# Patient Record
Sex: Female | Born: 1980
Health system: Southern US, Community
[De-identification: ages and names within clinical notes are randomized; demographics above are authoritative.]

## PROBLEM LIST (undated history)

## (undated) DIAGNOSIS — I10 Essential (primary) hypertension: Secondary | ICD-10-CM

## (undated) DIAGNOSIS — E119 Type 2 diabetes mellitus without complications: Secondary | ICD-10-CM

## (undated) DIAGNOSIS — E282 Polycystic ovarian syndrome: Secondary | ICD-10-CM

## (undated) DIAGNOSIS — E785 Hyperlipidemia, unspecified: Secondary | ICD-10-CM

## (undated) HISTORY — DX: Essential (primary) hypertension: I10

## (undated) HISTORY — DX: Polycystic ovarian syndrome: E28.2

## (undated) HISTORY — DX: Type 2 diabetes mellitus without complications: E11.9

## (undated) HISTORY — DX: Hyperlipidemia, unspecified: E78.5

---

## 2001-10-07 ENCOUNTER — Emergency Department (HOSPITAL_COMMUNITY): Admission: EM | Admit: 2001-10-07 | Discharge: 2001-10-07 | Payer: Self-pay | Admitting: Emergency Medicine

## 2004-03-08 ENCOUNTER — Emergency Department (HOSPITAL_COMMUNITY): Admission: EM | Admit: 2004-03-08 | Discharge: 2004-03-08 | Payer: Self-pay | Admitting: Emergency Medicine

## 2007-04-20 ENCOUNTER — Emergency Department (HOSPITAL_COMMUNITY): Admission: EM | Admit: 2007-04-20 | Discharge: 2007-04-21 | Payer: Self-pay | Admitting: Emergency Medicine

## 2008-07-20 ENCOUNTER — Inpatient Hospital Stay (HOSPITAL_COMMUNITY): Admission: AD | Admit: 2008-07-20 | Discharge: 2008-07-20 | Payer: Self-pay | Admitting: Obstetrics and Gynecology

## 2008-09-30 ENCOUNTER — Inpatient Hospital Stay (HOSPITAL_COMMUNITY): Admission: AD | Admit: 2008-09-30 | Discharge: 2008-10-05 | Payer: Self-pay | Admitting: Obstetrics and Gynecology

## 2008-09-30 ENCOUNTER — Inpatient Hospital Stay (HOSPITAL_COMMUNITY): Admission: AD | Admit: 2008-09-30 | Discharge: 2008-09-30 | Payer: Self-pay | Admitting: Obstetrics and Gynecology

## 2008-10-01 ENCOUNTER — Encounter: Payer: Self-pay | Admitting: Obstetrics and Gynecology

## 2008-10-02 ENCOUNTER — Encounter (INDEPENDENT_AMBULATORY_CARE_PROVIDER_SITE_OTHER): Payer: Self-pay | Admitting: *Deleted

## 2008-10-02 ENCOUNTER — Encounter (INDEPENDENT_AMBULATORY_CARE_PROVIDER_SITE_OTHER): Payer: Self-pay | Admitting: Obstetrics and Gynecology

## 2010-07-26 LAB — TYPE AND SCREEN
ABO/RH(D): O POS
ABO/RH(D): O POS
Antibody Screen: NEGATIVE
Antibody Screen: NEGATIVE

## 2010-07-26 LAB — PROTIME-INR
INR: 1 (ref 0.00–1.49)
Prothrombin Time: 13.2 seconds (ref 11.6–15.2)

## 2010-07-26 LAB — CBC
HCT: 26 % — ABNORMAL LOW (ref 36.0–46.0)
HCT: 30.4 % — ABNORMAL LOW (ref 36.0–46.0)
HCT: 32.3 % — ABNORMAL LOW (ref 36.0–46.0)
HCT: 33 % — ABNORMAL LOW (ref 36.0–46.0)
Hemoglobin: 10.7 g/dL — ABNORMAL LOW (ref 12.0–15.0)
Hemoglobin: 8.7 g/dL — ABNORMAL LOW (ref 12.0–15.0)
Hemoglobin: 9.9 g/dL — ABNORMAL LOW (ref 12.0–15.0)
Hemoglobin: 11 g/dL — ABNORMAL LOW (ref 12.0–15.0)
MCHC: 32.7 g/dL (ref 30.0–36.0)
MCHC: 33.2 g/dL (ref 30.0–36.0)
MCHC: 33.3 g/dL (ref 30.0–36.0)
MCHC: 33.4 g/dL (ref 30.0–36.0)
MCV: 74.1 fL — ABNORMAL LOW (ref 78.0–100.0)
MCV: 74.3 fL — ABNORMAL LOW (ref 78.0–100.0)
MCV: 74.4 fL — ABNORMAL LOW (ref 78.0–100.0)
MCV: 74.2 fL — ABNORMAL LOW (ref 78.0–100.0)
Platelets: 247 10*3/uL (ref 150–400)
Platelets: 310 10*3/uL (ref 150–400)
Platelets: 312 10*3/uL (ref 150–400)
Platelets: 316 10*3/uL (ref 150–400)
RBC: 3.49 MIL/uL — ABNORMAL LOW (ref 3.87–5.11)
RBC: 4.09 MIL/uL (ref 3.87–5.11)
RBC: 4.36 MIL/uL (ref 3.87–5.11)
RBC: 4.44 MIL/uL (ref 3.87–5.11)
RDW: 15.1 % (ref 11.5–15.5)
RDW: 15.1 % (ref 11.5–15.5)
RDW: 15.2 % (ref 11.5–15.5)
RDW: 14.8 % (ref 11.5–15.5)
WBC: 14.1 10*3/uL — ABNORMAL HIGH (ref 4.0–10.5)
WBC: 15.5 10*3/uL — ABNORMAL HIGH (ref 4.0–10.5)
WBC: 17 10*3/uL — ABNORMAL HIGH (ref 4.0–10.5)
WBC: 13.8 10*3/uL — ABNORMAL HIGH (ref 4.0–10.5)

## 2010-07-26 LAB — FIBRINOGEN: Fibrinogen: 545 mg/dL — ABNORMAL HIGH (ref 204–475)

## 2010-07-26 LAB — TSH: TSH: 1.025 u[IU]/mL (ref 0.350–4.500)

## 2010-07-26 LAB — RPR: RPR Ser Ql: NONREACTIVE

## 2010-07-26 LAB — ABO/RH: ABO/RH(D): O POS

## 2010-07-26 LAB — APTT: aPTT: 23 seconds — ABNORMAL LOW (ref 24–37)

## 2010-07-28 LAB — URINALYSIS, ROUTINE W REFLEX MICROSCOPIC
Bilirubin Urine: NEGATIVE
Glucose, UA: NEGATIVE mg/dL
Hgb urine dipstick: NEGATIVE
Ketones, ur: 15 mg/dL — AB
Nitrite: NEGATIVE
Protein, ur: NEGATIVE mg/dL
Specific Gravity, Urine: >1.03 — ABNORMAL HIGH (ref 1.005–1.030)
Urobilinogen, UA: 0.2 mg/dL (ref 0.0–1.0)
pH: 6.5 (ref 5.0–8.0)

## 2010-07-28 LAB — WET PREP, GENITAL
Trich, Wet Prep: NONE SEEN
Yeast Wet Prep HPF POC: NONE SEEN

## 2010-08-31 NOTE — Op Note (Signed)
NAME:  Ana [name], SPRUNGER NO.:  [id number]   MEDICAL RECORD NO.:  [id number]          PATIENT TYPE:  OUT   LOCATION:  MFM                           FACILITY:  [facility]   PHYSICIAN:  Lenoard Aden, M.D.DATE OF BIRTH:  1980/11/25   DATE OF PROCEDURE:  10/02/2008  DATE OF DISCHARGE:  10/01/2008                               OPERATIVE REPORT   PREOPERATIVE DIAGNOSIS:  A 30-6/7-week intrauterine pregnancy with third  trimester bleeding and acute placental abruption.   POSTOPERATIVE DIAGNOSIS:  A 30-6/7-week intrauterine pregnancy with  third trimester bleeding and acute placental abruption.   PROCEDURE:  Primary low segment transverse cesarean section.   SURGEON:  Lenoard Aden, MD   ASSISTANT:  Genia Del, MD   ANESTHESIA:  Spinal by Malen Gauze.   ESTIMATED BLOOD LOSS:  900 mL.   COMPLICATIONS:  None.   DRAINS:  Foley.   COUNTS:  Correct.  The patient to recovery in good condition.   BRIEF OPERATIVE NOTE:  After being apprised of risks of anesthesia,  infection, bleeding, injury to abdominal organs, need for repair,  delayed versus immediate complications include bowel and bladder injury,  the patient was brought to the operating room where she was administered  a general anesthetic without complications, prepped and draped in usual  sterile fashion.  Foley catheter placed after achieving adequate  anesthesia dilute Marcaine solution placed.  A Pfannenstiel skin  incision made with scalpel, carried down to fascia which was nicked in  the midline, and opened transversely using Mayo scissors.  Rectus  muscles dissected sharply in the midline.  Peritoneum entered sharply.  Bladder blade placed.  Visceral peritoneum scored sharply off the lower  uterine segment.  Kerr hysterotomy incision made.  Atraumatic delivery  after amniotomy clear fluid preterm living female handed to the  pediatricians attendance.  Apgars pending.  Cord blood collected.  Cord  pH  blood collected.  Placenta delivered from posterior and anterior  location with succenturiate lobe noted and sent to pathology.  Uterus  was exteriorized, curetted using a dry lap pack, and closed in 2 running  imbricating layers of 0 Monocryl suture.  Good hemostasis noted.  Bladder flap inspected and found to be hemostatic.  Irrigation  accomplished.  Fascia then closed using 0  Monocryl in a running fashion.  Skin closed using staples.  The patient  tolerates the procedure well, transferred to recovery in good condition.  Normal tubes and normal ovaries were identified.  Normal intrauterine  cavity appreciated.      Lenoard Aden, M.D.  Electronically Signed     RJT/MEDQ  D:  10/02/2008  T:  10/03/2008  Job:  [id number]

## 2010-08-31 NOTE — H&P (Signed)
NAMEKRISSI, Ana [name] NO.:  [id number]   MEDICAL RECORD NO.:  [id number]          PATIENT TYPE:  INP   LOCATION:  9151                          FACILITY:  [facility]   PHYSICIAN:  Lenoard Aden, M.D.DATE OF BIRTH:  27-Jun-1980   DATE OF ADMISSION:  09/30/2008  DATE OF DISCHARGE:                              HISTORY & PHYSICAL   CHIEF COMPLAINT:  Third trimester bleeding.   She is a 30 year old African American female, G2, P0, history of SAB at  12 weeks, who presents with onset of bleeding this morning after  intercourse on Sunday and Monday was evaluated in the office with a  normal ultrasound negative.  Pelvic exam and the BPP elevated, and  received betamethasone x1 this afternoon, and then had recurrent  bleeding bright red blood this evening is now admitted for further  observation.   She has no known drug allergies.   Medications are prenatal vitamins.   She has a medical history remarkable for depression and fertility and  PCL.   She has family history of sickle cell trait, diabetes, myocardial  infarction, chronic hypertension, and thyroid disease.  She has a  history of 1 uncomplicated 12-week pregnancy loss in 2004.   Prenatal course has been complicated by a more recent ultrasound which  had shown evidence of polyhydramnios, but appropriately grown fetus.  Polyhydramnios, however, had resolved on ultrasound today with an AFI of  21.   PHYSICAL EXAMINATION:  GENERAL:  She is a well-developed, well-  nourished, African American female, in no acute distress.  VITAL SIGNS:  Blood pressure 148/90, 110/70 in the office today.  HEENT: Normal.  NECK:  Supple.  Full range of motion.  LUNGS:  Clear.  HEART:  Regular rate and rhythm.  ABDOMEN:  Soft, gravid, nontender.  No CVA tenderness.  CERVICAL:  Deferred.  EXTREMITIES:  There are no cords.  NEUROLOGIC:  Nonfocal.  SKIN:  Intact.   The patient has brought a piece of toilet paper to the hospital  with  minimal amount of bright red spotting on it.  NST reactive.  No regular  contractions are noted.   IMPRESSION:  A 30-4/7 week intrauterine pregnancy with unexplained third  trimester bleeding, questionable chronic abruptio, history of an  ultrasound with appropriately grown fetus, and normal amniotic fluid  index.   PLAN:  1. Continuous monitoring, tocolysis as needed, betamethasone at times.  2. To be given as scheduled and will remain as an inpatient at this      time.      Lenoard Aden, M.D.  Electronically Signed     RJT/MEDQ  D:  09/30/2008  T:  10/01/2008  Job:  [id number]

## 2010-09-03 NOTE — Discharge Summary (Signed)
Ana [name], JOHANNESEN NO.:  [id number]   MEDICAL RECORD NO.:  [id number]          PATIENT TYPE:  INP   LOCATION:  9143                          FACILITY:  [facility]   PHYSICIAN:  Lenoard Aden, M.D.DATE OF BIRTH:  Feb 11, 1981   DATE OF ADMISSION:  09/30/2008  DATE OF DISCHARGE:  10/05/2008                               DISCHARGE SUMMARY   The patient admitted for third trimester bleeding of unknown etiology.  Bleeding persisted which resulted in performance with primary C-section  due to placental abruption.  The patient was uncomplicated primary C-  section on October 02, 2008.  Postoperative course uncomplicated.  Tolerated regular diet well.  Discharged to home on hospital day #3.   DISCHARGE MEDICATIONS:  Percocet, Motrin, Flexeril, and Niferex.  Follow  up in the office 4-6 weeks.  Discharge teaching done.      Lenoard Aden, M.D.  Electronically Signed     RJT/MEDQ  D:  10/28/2008  T:  10/29/2008  Job:  [id number]

## 2011-01-05 LAB — POCT PREGNANCY, URINE
Operator id: 264421
Preg Test, Ur: NEGATIVE

## 2011-01-05 LAB — DIFFERENTIAL
Basophils Absolute: 0
Basophils Relative: 1
Eosinophils Absolute: 0.1
Eosinophils Relative: 2
Lymphocytes Relative: 28
Lymphs Abs: 2.1
Monocytes Absolute: 0.6
Monocytes Relative: 8
Neutro Abs: 4.7
Neutrophils Relative %: 63

## 2011-01-05 LAB — BASIC METABOLIC PANEL
BUN: 15
CO2: 26
Calcium: 9.6
Chloride: 105
Creatinine, Ser: 0.83
GFR calc Af Amer: >60
GFR calc non Af Amer: >60
Glucose, Bld: 93
Potassium: 4.3
Sodium: 139

## 2011-01-05 LAB — CBC
HCT: 39.5
Hemoglobin: 13
MCHC: 33
MCV: 70.2 — ABNORMAL LOW
Platelets: 390
RBC: 5.62 — ABNORMAL HIGH
RDW: 14.8
WBC: 7.5

## 2013-01-29 ENCOUNTER — Ambulatory Visit: Payer: Self-pay | Admitting: Internal Medicine

## 2013-02-01 ENCOUNTER — Encounter: Payer: Self-pay | Admitting: Internal Medicine

## 2013-02-01 ENCOUNTER — Ambulatory Visit (INDEPENDENT_AMBULATORY_CARE_PROVIDER_SITE_OTHER): Payer: BC Managed Care – PPO | Admitting: Internal Medicine

## 2013-02-01 VITALS — BP 122/74 | HR 106 | Temp 98.3°F | Resp 10 | Ht 63.0 in | Wt 219.5 lb

## 2013-02-01 DIAGNOSIS — IMO0001 Reserved for inherently not codable concepts without codable children: Secondary | ICD-10-CM

## 2013-02-01 DIAGNOSIS — Z23 Encounter for immunization: Secondary | ICD-10-CM

## 2013-02-01 DIAGNOSIS — E1165 Type 2 diabetes mellitus with hyperglycemia: Secondary | ICD-10-CM

## 2013-02-01 DIAGNOSIS — IMO0002 Reserved for concepts with insufficient information to code with codable children: Secondary | ICD-10-CM

## 2013-02-01 MED ORDER — CANAGLIFLOZIN 100 MG PO TABS: 100.0000 mg | ORAL_TABLET | Freq: Every day | ORAL | Status: DC

## 2013-02-01 MED ORDER — METFORMIN HCL 1000 MG PO TABS: 1000.0000 mg | ORAL_TABLET | Freq: Two times a day (BID) | ORAL | Status: DC

## 2013-02-01 NOTE — Patient Instructions (Addendum)
Please increase Metformin: Please take 1000 mg x 3 days. If you tolerate this well, start 1000 mg metformin with breakfast. Continue with 1000 mg of metformin twice a day with breakfast and dinner. Start Invokana 100 mg before breakfast. Please return in 1 month with your sugar log - check sugars 2x a day. You will be called to schedule the appointments with a PCP, with nutrition, and with the eye dr.  PATIENT INSTRUCTIONS FOR TYPE 2 DIABETES:  **Please join MyChart!** - see attached instructions about how to join   DIET AND EXERCISE Diet and exercise is an important part of diabetic treatment.  We recommended aerobic exercise in the form of brisk walking (working between 40-60% of maximal aerobic capacity, similar to brisk walking) for 150 minutes per week (such as 30 minutes five days per week) along with 3 times per week performing 'resistance' training (using various gauge rubber tubes with handles) 5-10 exercises involving the major muscle groups (upper body, lower body and core) performing 10-15 repetitions (or near fatigue) each exercise. Start at half the above goal but build slowly to reach the above goals. If limited by weight, joint pain, or disability, we recommend daily walking in a swimming pool with water up to waist to reduce pressure from joints while allow for adequate exercise.    BLOOD GLUCOSES Monitoring your blood glucoses is important for continued management of your diabetes. Please check your blood glucoses 2-4 times a day: fasting, before meals and at bedtime (you can rotate these measurements - e.g. one day check before the 3 meals, the next day check before 2 of the meals and before bedtime, etc.   HYPOGLYCEMIA (low blood sugar) Hypoglycemia is usually a reaction to not eating, exercising, or taking too much insulin/ other diabetes drugs.  Symptoms include tremors, sweating, hunger, confusion, headache, etc. Treat IMMEDIATELY with 15 grams of Carbs:   4 glucose  tablets    cup regular juice/soda   2 tablespoons raisins   4 teaspoons sugar   1 tablespoon honey Recheck blood glucose in 15 mins and repeat above if still symptomatic/blood glucose <100. Please contact our office at [phone] if you have questions about how to next handle your insulin.  RECOMMENDATIONS TO REDUCE YOUR RISK OF DIABETIC COMPLICATIONS: * Take your prescribed MEDICATION(S). * Follow a DIABETIC diet: Complex carbs, fiber rich foods, heart healthy fish twice weekly, (monounsaturated and polyunsaturated) fats * AVOID saturated/trans fats, high fat foods, >2,300 mg salt per day. * EXERCISE at least 5 times a week for 30 minutes or preferably daily.  * DO NOT SMOKE OR DRINK more than 1 drink a day. * Check your FEET every day. Do not wear tightfitting shoes. Contact us if you develop an ulcer * See your EYE doctor once a year or more if needed * Get a FLU shot once a year * Get a PNEUMONIA vaccine once before and once after age 23 years  GOALS:  * Your Hemoglobin A1c of <7%  * fasting sugars need to be <130 * after meals sugars need to be <180 (2h after you start eating) * Your Systolic BP should be 140 or lower  * Your Diastolic BP should be 80 or lower  * Your HDL (Good Cholesterol) should be 40 or higher  * Your LDL (Bad Cholesterol) should be 100 or lower  * Your Triglycerides should be 150 or lower  * Your Urine microalbumin (kidney function) should be <30 * Your Body Mass Index should be  25 or lower   We will be glad to help you achieve these goals. Our telephone number is: [phone].  Please consider the following ways to cut down carbs and fat and increase fiber and micronutrients in your diet:  - substitute whole grain for white bread or pasta - substitute brown rice for white rice - substitute 90-calorie flat bread pieces for slices of bread when possible - substitute sweet potatoes or yams for white potatoes - substitute humus for margarine -  substitute tofu for cheese when possible - substitute almond or rice milk for regular milk (would not drink soy milk daily due to concern for soy estrogen influence on breast cancer risk) - substitute dark chocolate for other sweets when possible - substitute water - can add lemon or orange slices for taste - for diet sodas (artificial sweeteners will trick your body that you can eat sweets without getting calories and will lead you to overeating and weight gain in the long run) - do not skip breakfast or other meals (this will slow down the metabolism and will result in more weight gain over time)  - can try smoothies made from fruit and almond/rice milk in am instead of regular breakfast - can also try old-fashioned (not instant) oatmeal made with almond/rice milk in am - order the dressing on the side when eating salad at a restaurant (pour less than half of the dressing on the salad) - eat as little meat as possible - can try juicing, but should not forget that juicing will get rid of the fiber, so would alternate with eating raw veg./fruits or drinking smoothies - use as little oil as possible, even when using olive oil - can dress a salad with a mix of balsamic vinegar and lemon juice, for e.g. - use agave nectar, stevia sugar, or regular sugar rather than artificial sweateners - steam or broil/roast veggies  - snack on veggies/fruit/nuts (unsalted, preferably) when possible, rather than processed foods - reduce or eliminate aspartame in diet (it is in diet sodas, chewing gum, etc) Read the labels!  Try to read Dr. Katherina Right book: "Program for Reversing Diabetes" for the vegan concept and other ideas for healthy eating.

## 2013-02-01 NOTE — Progress Notes (Signed)
Patient ID: Ana [name], female   DOB: [birth year], 32 y.o.   MRN: [id number]  HPI: Ana [name] is a 32 y.o.-year-old female, referred by her PCP, Dr.Brian Thea Silversmith, for management of DM2, non-insulin-dependent, uncontrolled, without complications.  Patient has been diagnosed with diabetes this year; she has not been on insulin before. Last hemoglobin A1c was: 10.5% (1.5 mo ago). Previously 6.5%. She also has PCOS and gained 25 lbs in last 2 months.   Pt is on a regimen of: - Metformin 500 mg po bid - no problems with it - given Lantus 20 units nightly (only to last her until receiving the "Medifast - Take Shape for Life" meals, suggested by PCP - tried this but 300$ per month and she could not afford it) >> continued Lantus for 1 mo.  Pt does not check sugars. She was given a One Touch Ultra meter but not shown how to use it.  ? Lows; ? hypoglycemia awareness.  She was on JanuMet when she was prediabetic >> tolerated it well.   Pt's meals are: - Breakfast: bisquit - Lunch: fast food - Dinner:chicken, veg, starch - Snacks: rarely Drinks regular sodas 2x a day.   - no CKD, last BUN/creatinine:  Lab Results  Component Value Date   BUN 15 04/21/2007   CREATININE 0.83 04/21/2007  Last ACR by PCP: normal, but had proteinuria after pregnancy 2010.  - Pt has HL, is on Pravastatin 10. - No eye exams.  - no numbness and tingling in her feet.  I reviewed her chart and she also has a history of PCOS, HL, HTN. She has an IUD now.   Pt has FH of DM in mother, father.   ROS: Constitutional: + weight gain, no fatigue, no subjective hyperthermia/hypothermia Eyes: no blurry vision, no xerophthalmia ENT: no sore throat, no nodules palpated in throat, no dysphagia/odynophagia, no hoarseness Cardiovascular: no CP/+SOB/palpitations/leg swelling Respiratory: no cough/+SOB Gastrointestinal: no N/V/D/C Musculoskeletal: no muscle/joint aches Skin: no rashes, + itching, + excessive hair  growth Neurological: no tremors/numbness/tingling/dizziness Psychiatric: no depression/anxiety, + HA Low Libido Past Medical History  Diagnosis Date  . Diabetes mellitus without complication     Type II  . Hypertension   . PCOS (polycystic ovarian syndrome)    Past Surgical History  Procedure Laterality Date  . Cesarean section     History   Social History  . Marital Status: Married    Spouse Name: N/A    Number of Children: 1, 6 y/o   Occupational History  . banker   Social History Main Topics  . Smoking status: Never Smoker   . Smokeless tobacco: Not on file  . Alcohol Use: No  . Drug Use: No  . Sexual Activity: Yes    Partners: Male   Social History Narrative   Regular exercise: no   Caffeine use: 2 soda's daily   No current outpatient prescriptions on file prior to visit.   No current facility-administered medications on file prior to visit.   Not on File Family History  Problem Relation Age of Onset  . Diabetes Mother     Type II  . Thyroid disease Mother   . Hypertension Mother   . Hyperlipidemia Mother   . Diabetes Father     TYPE II  . Hypertension Father   . Hyperlipidemia Father   . Heart disease Father   . Hyperlipidemia Maternal Grandmother   . Hypertension Maternal Grandmother   . Drug abuse Maternal Grandmother   .  Obesity Maternal Grandmother   . Cancer Paternal Grandmother     colon cancer   PE: BP 122/74  Pulse 106  Temp(Src) 98.3 F (36.8 C) (Oral)  Resp 10  Ht 5\' 3"  (1.6 m)  Wt 219 lb 8 oz (99.565 kg)  BMI 38.89 kg/m2  SpO2 97% Wt Readings from Last 3 Encounters:  02/01/13 219 lb 8 oz (99.565 kg)   Constitutional: obese, in NAD Eyes: PERRLA, EOMI, no exophthalmos ENT: moist mucous membranes, no thyromegaly, no cervical lymphadenopathy Cardiovascular: RRR, No MRG Respiratory: CTA B Gastrointestinal: abdomen soft, NT, ND, BS+ Musculoskeletal: no deformities, strength intact in all 4 Skin: moist, warm, + acanthosis  nigricans on neck Neurological: no tremor with outstretched hands, DTR normal in all 4  ASSESSMENT: 1. DM2, non-insulin-dependent, uncontrolled, without complications  PLAN:  1. Patient with recently dx-ed diabetes, on minimum oral antidiabetic regimen, which became insufficient - We discussed about options for treatment, and I suggested to:  Please increase Metformin: Please take 1000 mg x 3 days. If you tolerate this well, start 1000 mg metformin with breakfast. Continue with 1000 mg of metformin twice a day with breakfast and dinner. Start Invokana 100 mg before breakfast. Please return in 1 month with your sugar log - check sugars 2x a day. You will be called to schedule the appointments with a PCP, with nutrition, and with the eye dr. - discussed at length about healthy nutrition and will also refer to Nutritionist - Strongly advised her to start checking her sugars at different times of the day - check 2 times a day, rotating checks - we showed her how to use her meter. - given sugar log and advised how to fill it and to bring it at next appt  - given foot care handout and explained the principles  - given instructions for hypoglycemia management "15-15 rule"  - given flu vaccine - pt will bring records from previous PCP, if not, will check labs at next visit - advised for yearly eye exams >> referred to Dr Ashley Royalty - Return to clinic in one month with sugar log   No note to PCP, pt would like to switch providers >> referred to [facility].

## 2013-02-16 LAB — HM PAP SMEAR: HM PAP: NORMAL

## 2013-02-27 ENCOUNTER — Encounter (INDEPENDENT_AMBULATORY_CARE_PROVIDER_SITE_OTHER): Payer: Self-pay | Admitting: Ophthalmology

## 2013-03-06 ENCOUNTER — Encounter (INDEPENDENT_AMBULATORY_CARE_PROVIDER_SITE_OTHER): Payer: Self-pay | Admitting: Ophthalmology

## 2013-03-07 ENCOUNTER — Ambulatory Visit (INDEPENDENT_AMBULATORY_CARE_PROVIDER_SITE_OTHER): Payer: BC Managed Care – PPO | Admitting: Internal Medicine

## 2013-03-07 ENCOUNTER — Ambulatory Visit: Payer: BC Managed Care – PPO | Admitting: Internal Medicine

## 2013-03-07 ENCOUNTER — Encounter: Payer: Self-pay | Admitting: Internal Medicine

## 2013-03-07 VITALS — BP 110/78 | HR 104 | Temp 98.6°F | Resp 10 | Wt 214.0 lb

## 2013-03-07 DIAGNOSIS — E785 Hyperlipidemia, unspecified: Secondary | ICD-10-CM

## 2013-03-07 DIAGNOSIS — E1169 Type 2 diabetes mellitus with other specified complication: Secondary | ICD-10-CM | POA: Insufficient documentation

## 2013-03-07 DIAGNOSIS — R030 Elevated blood-pressure reading, without diagnosis of hypertension: Secondary | ICD-10-CM

## 2013-03-07 DIAGNOSIS — R51 Headache: Secondary | ICD-10-CM

## 2013-03-07 DIAGNOSIS — E119 Type 2 diabetes mellitus without complications: Secondary | ICD-10-CM

## 2013-03-07 DIAGNOSIS — R519 Headache, unspecified: Secondary | ICD-10-CM | POA: Insufficient documentation

## 2013-03-07 LAB — MICROALBUMIN / CREATININE URINE RATIO
Creatinine,U: 102.3 mg/dL
Microalb Creat Ratio: 36.3 mg/g — ABNORMAL HIGH (ref 0.0–30.0)
Microalb, Ur: 37.1 mg/dL — ABNORMAL HIGH (ref 0.0–1.9)

## 2013-03-07 LAB — COMPREHENSIVE METABOLIC PANEL
ALT: 31 U/L (ref 0–35)
AST: 38 U/L — ABNORMAL HIGH (ref 0–37)
Albumin: 4 g/dL (ref 3.5–5.2)
CO2: 25 mEq/L (ref 19–32)
Calcium: 9.5 mg/dL (ref 8.4–10.5)
Chloride: 104 mEq/L (ref 96–112)
Creatinine, Ser: 1.1 mg/dL (ref 0.4–1.2)
GFR: 77.45 mL/min (ref >60.00)
Glucose, Bld: 84 mg/dL (ref 70–99)
Potassium: 4.1 mEq/L (ref 3.5–5.1)

## 2013-03-07 LAB — LDL CHOLESTEROL, DIRECT: Direct LDL: 117.8 mg/dL

## 2013-03-07 LAB — LIPID PANEL: HDL: 39.1 mg/dL (ref >39.00)

## 2013-03-07 LAB — HEMOGLOBIN A1C: Hgb A1c MFr Bld: 7.9 % — ABNORMAL HIGH (ref 4.6–6.5)

## 2013-03-07 MED ORDER — CANAGLIFLOZIN 100 MG PO TABS: 100.0000 mg | ORAL_TABLET | Freq: Every day | ORAL | Status: DC

## 2013-03-07 NOTE — Progress Notes (Signed)
Patient ID: Ana [name], female   DOB: 1981/03/07, 32 y.o.   MRN: [id number]  HPI: Ana [name] is a 32 y.o.-year-old female, returning for f/u DM2, dx summer 2014, non-insulin-dependent, uncontrolled, without complications. Last visit 1 mo ago.   Last hemoglobin A1c was: 10.5% (~3 mo ago). Previously 6.5%.   Pt is on a regimen of: - Metformin 1000 mg po bid >> nausea and diarrhea occasionally - Invokana started at last visit >> at 100 mg now She was on JanuMet when she was prediabetic >> tolerated it well.   She has HAs almost every day an they are stronger than before. [facility] She tried Medrin, Parafan forte (mm relaxant). Now taking Excedrin. She checked BP at the pharmacy recently >> 150s systolic. Does not have a cuff at home.  At last visit, she was not checking sugars. She was given a One Touch Ultra meter but not shown how to use it. At last visit, we demonstrated how to use it and I advised her when to check. She started to check now,  - am: 71-122 - later: 95-140s Highest 148, when she forgot Invokana. No lows.  Pt's meals are: - Breakfast: bisquit - Lunch: fast food - Dinner:chicken, veg, starch - Snacks: rarely Drinks regular sodas 2x a day.  I referred her to nutrition at last visit, but still awaiting phone call to schedule appt.  - no CKD, last BUN/creatinine:  Lab Results  Component Value Date   BUN 15 04/21/2007   CREATININE 0.83 04/21/2007  Last ACR by PCP: normal, but had proteinuria after pregnancy 2010.  - Pt has HL, is on Pravastatin 10. - No eye exams.  - no numbness and tingling in her feet.  She also has a history of PCOS, HL, HTN.   I reviewed pt's medications, allergies, PMH, social hx, family hx and no changes required, except as mentioned above.  ROS: Constitutional: + weight loss, no fatigue, no subjective hyperthermia/hypothermia, + excessive urination Eyes: + blurry vision (with HAs), no  xerophthalmia ENT: no sore throat, no nodules palpated in throat, no dysphagia/odynophagia, no hoarseness Cardiovascular: no CP/SOB/palpitations/leg swelling Respiratory: no cough/SOB Gastrointestinal: +N/+V x1 since last visit/no D/C Musculoskeletal: no muscle/joint aches Skin: no rashes Neurological: no tremors/numbness/tingling/dizziness, + HA!  PE: BP 110/78  Pulse 104  Temp(Src) 98.6 F (37 C) (Oral)  Resp 10  Wt 214 lb (97.07 kg)  SpO2 97% Wt Readings from Last 3 Encounters:  03/07/13 214 lb (97.07 kg)  02/01/13 219 lb 8 oz (99.565 kg)   Constitutional: obese, in NAD Eyes: PERRLA, EOMI, no exophthalmos ENT: moist mucous membranes, no thyromegaly, no cervical lymphadenopathy Cardiovascular: RRR, No MRG Respiratory: CTA B Gastrointestinal: abdomen soft, NT, ND, BS+ Musculoskeletal: no deformities, strength intact in all 4 Skin: moist, warm, + acanthosis nigricans on neck Neurological: no tremor with outstretched hands, DTR normal in all 4  ASSESSMENT: 1. DM2, non-insulin-dependent, uncontrolled, without complications  2. Headaches  3. High BP reading  4. HL  PLAN:  1. Patient with recently dx-ed diabetes, on oral regimen, with great improvement in DM control after increasing Metformin to maximum dose and starting Invokana.  - I suggested to:  Continue Metformin 1000 mg 2x a day Continue Invokana 100 mg before breakfast. Please return in 1 month with your sugar log - check sugars 2x a day. You will be called to schedule the appointments with a PCP, with nutrition, and with the eye dr. - awaiting  appt with Nutritionist - continue to check sugars 2 times a day, rotating checks - given flu vaccine at last visit - advised for yearly eye exams >> referred to Dr Ashley Royalty >> will have the appt next month.  - will check Hba1c, Lipids, CMP and ACR today. Pt fasting. - Return to clinic in 3 months with sugar log   2. HAs - advised pt to stay hydrated - can alternate  Alleve and Tylenol - may need to contact HA clinic again if HAs not subsiding  3. High BP reading - No dx of HTN - advised her about JNC 8 target for BP in pts with DM: <140/90 - advised to buy a cuff and check at home and bring cuff at next appt  - might need ACEI - discussed that she needs good contraception with these meds (has an IUD).  4. HL - On low dose Pravastatin - check LFTs and Lipids today - see if needs increased dose  Pt has appt with new PCP in 07/2013.   Office Visit on 03/07/2013  Component Date Value Range Status  . Sodium 03/07/2013 137  135 - 145 mEq/L Final  . Potassium 03/07/2013 4.1  3.5 - 5.1 mEq/L Final  . Chloride 03/07/2013 104  96 - 112 mEq/L Final  . CO2 03/07/2013 25  19 - 32 mEq/L Final  . Glucose, Bld 03/07/2013 84  70 - 99 mg/dL Final  . BUN 91/47/8295 14  6 - 23 mg/dL Final  . Creatinine, Ser 03/07/2013 1.1  0.4 - 1.2 mg/dL Final  . Total Bilirubin 03/07/2013 0.3  0.3 - 1.2 mg/dL Final  . Alkaline Phosphatase 03/07/2013 70  39 - 117 U/L Final  . AST 03/07/2013 38* 0 - 37 U/L Final  . ALT 03/07/2013 31  0 - 35 U/L Final  . Total Protein 03/07/2013 8.2  6.0 - 8.3 g/dL Final  . Albumin 62/13/0865 4.0  3.5 - 5.2 g/dL Final  . Calcium 78/46/9629 9.5  8.4 - 10.5 mg/dL Final  . GFR 52/84/1324 77.45  >60.00 mL/min Final  . Cholesterol 03/07/2013 198  0 - 200 mg/dL Final   ATP III Classification       Desirable:  < 200 mg/dL               Borderline High:  200 - 239 mg/dL          High:  > = 401 mg/dL  . Triglycerides 03/07/2013 208.0* 0.0 - 149.0 mg/dL Final   Normal:  <027 mg/dLBorderline High:  150 - 199 mg/dL  . HDL 03/07/2013 39.10  >39.00 mg/dL Final  . VLDL 25/36/6440 41.6* 0.0 - 40.0 mg/dL Final  . Total CHOL/HDL Ratio 03/07/2013 5   Final                  Men          Women1/2 Average Risk     3.4          3.3Average Risk          5.0          4.42X Average Risk          9.6          7.13X Average Risk          15.0          11.0                       .  Hemoglobin A1C 03/07/2013 7.9* 4.6 - 6.5 % Final   Glycemic Control Guidelines for People with Diabetes:Non Diabetic:  <6%Goal of Therapy: <7%Additional Action Suggested:  >8%   . Microalb, Ur 03/07/2013 37.1* 0.0 - 1.9 mg/dL Final  . Creatinine,U 16/01/9603 102.3   Final  . Microalb Creat Ratio 03/07/2013 36.3* 0.0 - 30.0 mg/g Final  . Direct LDL 03/07/2013 117.8   Final   Optimal:  <100 mg/dLNear or Above Optimal:  100-129 mg/dLBorderline High:  130-159 mg/dLHigh:  160-189 mg/dLVery High:  >190 mg/dL  [id number] improved! Potassium normal. Will increase the Pravastatin to 20 mg daily. Increased urinary albumin. Recheck ACR at next visit and if still high, will need Lisinopril.

## 2013-03-07 NOTE — Patient Instructions (Signed)
Please continue Metformin 1000 mg 2x a day and Invokana 100 mg in am daily. Please stop at the lab.

## 2013-03-08 ENCOUNTER — Encounter: Payer: Self-pay | Admitting: *Deleted

## 2013-04-15 ENCOUNTER — Other Ambulatory Visit: Payer: Self-pay

## 2013-04-15 MED ORDER — PRAVASTATIN SODIUM 10 MG PO TABS: ORAL_TABLET | ORAL | Status: DC

## 2013-04-24 ENCOUNTER — Encounter: Payer: BC Managed Care – PPO | Attending: Internal Medicine | Admitting: *Deleted

## 2013-06-13 ENCOUNTER — Ambulatory Visit: Payer: BC Managed Care – PPO | Admitting: Internal Medicine

## 2013-06-30 ENCOUNTER — Other Ambulatory Visit: Payer: Self-pay | Admitting: Internal Medicine

## 2013-07-11 ENCOUNTER — Other Ambulatory Visit: Payer: Self-pay | Admitting: Internal Medicine

## 2013-07-11 ENCOUNTER — Encounter: Payer: Self-pay | Admitting: Internal Medicine

## 2013-07-11 ENCOUNTER — Ambulatory Visit (INDEPENDENT_AMBULATORY_CARE_PROVIDER_SITE_OTHER): Payer: BC Managed Care – PPO | Admitting: Internal Medicine

## 2013-07-11 VITALS — BP 118/86 | HR 107 | Temp 98.4°F | Resp 12 | Wt 210.0 lb

## 2013-07-11 DIAGNOSIS — E119 Type 2 diabetes mellitus without complications: Secondary | ICD-10-CM

## 2013-07-11 LAB — HEMOGLOBIN A1C: Hgb A1c MFr Bld: 6.6 % — ABNORMAL HIGH (ref 4.6–6.5)

## 2013-07-11 LAB — MICROALBUMIN / CREATININE URINE RATIO
CREATININE, U: 130.6 mg/dL
MICROALB UR: 12.3 mg/dL — AB (ref 0.0–1.9)
MICROALB/CREAT RATIO: 9.4 mg/g (ref 0.0–30.0)

## 2013-07-11 MED ORDER — CANAGLIFLOZIN 100 MG PO TABS: ORAL_TABLET | ORAL | Status: DC

## 2013-07-11 MED ORDER — GLUCOSE BLOOD VI STRP: ORAL_STRIP | Status: DC

## 2013-07-11 MED ORDER — PRAVASTATIN SODIUM 10 MG PO TABS: ORAL_TABLET | ORAL | Status: DC

## 2013-07-11 NOTE — Patient Instructions (Signed)
Continue Metformin 1000 mg 2x a day Continue Invokana 100 mg before breakfast.  Please return in 3 months with your sugar log.   Please stop at the lab.

## 2013-07-11 NOTE — Progress Notes (Signed)
Patient ID: [name], female   DOB: 12/10/1980, 33 y.o.   MRN: [id number]  HPI: Ana [name] is a 33 y.o.-year-old female, returning for f/u DM2, dx summer 2014, non-insulin-dependent, uncontrolled, without complications. Last visit 4 mo ago. She will see Dr Ana [name] next month.   Last hemoglobin A1c was: Lab Results  Component Value Date   HGBA1C 7.9* 03/07/2013   Previously 10.5% (10/2012). Previously 6.5%.   Pt is on a regimen of: - Metformin 1000 mg po bid >> nausea and diarrhea occasionally - Invokana started at last visit >> at 100 mg now She was on JanuMet when she was prediabetic >> tolerated it well.   She was given a One Touch Ultra meter - checking 0-1x a day. - am: 71-122 >> 71-121 - later: 95-140s >> 120s Highest 145. No lows.  Pt's meals are: - Breakfast: bisquit - Lunch: fast food - Dinner:chicken, veg, starch - Snacks: rarely Drinks regular sodas 2x a day.  I referred her to nutrition in the past, needs to reschedule appt.  - + mild CKD, last BUN/creatinine:  Lab Results  Component Value Date   BUN 14 03/07/2013   CREATININE 1.1 03/07/2013  Last ACR: normal, but had proteinuria after pregnancy 2010.  - Pt has HL. Last lipids: Lab Results  Component Value Date   CHOL 198 03/07/2013   HDL 39.10 03/07/2013   LDLDIRECT 117.8 03/07/2013   TRIG 208.0* 03/07/2013   CHOLHDL 5 03/07/2013  On Pravastatin 10 >> 20 in 02/2013. - No eye exams. Needs one. - no numbness and tingling in her feet.  Has increased thirst and urination - not at night.  She also has a history of PCOS, HL, HTN.   I reviewed pt's medications, allergies, PMH, social hx, family hx and no changes required, except as mentioned above.  ROS: Constitutional: + weight loss, no fatigue, + subjective hyperthermia, + excessive urination Eyes: + blurry vision (with HAs), no xerophthalmia ENT: no sore throat, no nodules palpated in throat, no dysphagia/odynophagia, no  hoarseness Cardiovascular: no CP/SOB/palpitations/leg swelling Respiratory: no cough/SOB Gastrointestinal: no N/V/D/C Musculoskeletal: no muscle/joint aches Skin: no rashes Neurological: no tremors/numbness/tingling/dizziness, + HA! Low libido   PE: BP 118/86  Pulse 107  Temp(Src) 98.4 F (36.9 C) (Oral)  Resp 12  Wt 210 lb (95.255 kg)  SpO2 97% Wt Readings from Last 3 Encounters:  07/11/13 210 lb (95.255 kg)  03/07/13 214 lb (97.07 kg)  02/01/13 219 lb 8 oz (99.565 kg)   Constitutional: obese, in NAD Eyes: PERRLA, EOMI, no exophthalmos ENT: moist mucous membranes, no thyromegaly, no cervical lymphadenopathy Cardiovascular: RRR, No MRG Respiratory: CTA B Gastrointestinal: abdomen soft, NT, ND, BS+ Musculoskeletal: no deformities, strength intact in all 4 Skin: moist, warm, + acanthosis nigricans on neck Neurological: no tremor with outstretched hands, DTR normal in all 4  ASSESSMENT: 1. DM2, non-insulin-dependent, uncontrolled, without complications  3. High BP reading  4. HL  PLAN:  1. Patient with recently dx-ed diabetes, on oral regimen, with great improvement in DM control after increasing Metformin to maximum dose and starting Invokana.  - I suggested to:  Patient Instructions  Continue Metformin 1000 mg 2x a day Continue Invokana 100 mg before breakfast. Please return in 3 months with your sugar log.  Please stop at the lab. - awaiting appt with Nutritionist - advised to reschedule - check sugars daily, rotating checks - advised for yearly eye exams >> referred to Dr Ana [name] - needs to reschedule - will  check Hba1c and ACR today. Pt fasting. - if ACR again high >> start Lisinopril. Pt with h/o Increased urinary albumin >> saw [facility] in the past. - refilled Invokana - Return to clinic in 3 months with sugar log   2. HAs - [facility] (Dr. Melvyn [name])  3. High BP reading - No dx of HTN - discussed about JNC 8 target for BP in pts with  DM: <140/90  4. HL - On 20 mg Pravastatin - increased at last visit from 10 mg - refilled Prevastatin   Office Visit on 07/11/2013  Component Date Value Ref Range Status  . Microalb, Ur 07/11/2013 12.3* 0.0 - 1.9 mg/dL Final  . Creatinine,U 16/10/960403/26/2015 130.6   Final  . Microalb Creat Ratio 07/11/2013 9.4  0.0 - 30.0 mg/g Final  . Hemoglobin A1C 07/11/2013 6.6* 4.6 - 6.5 % Final   Glycemic Control Guidelines for People with Diabetes:Non Diabetic:  <6%Goal of Therapy: <7%Additional Action Suggested:  >8%    Will hold off Lisinopril for now.

## 2013-07-31 ENCOUNTER — Other Ambulatory Visit: Payer: Self-pay | Admitting: Internal Medicine

## 2013-08-02 ENCOUNTER — Encounter: Payer: Self-pay | Admitting: Internal Medicine

## 2013-08-02 ENCOUNTER — Ambulatory Visit (INDEPENDENT_AMBULATORY_CARE_PROVIDER_SITE_OTHER): Payer: BC Managed Care – PPO | Admitting: Internal Medicine

## 2013-08-02 VITALS — BP 122/90 | HR 94 | Temp 97.7°F | Ht 62.0 in | Wt 208.4 lb

## 2013-08-02 DIAGNOSIS — Z23 Encounter for immunization: Secondary | ICD-10-CM

## 2013-08-02 DIAGNOSIS — E785 Hyperlipidemia, unspecified: Secondary | ICD-10-CM

## 2013-08-02 DIAGNOSIS — Z0000 Encounter for general adult medical examination without abnormal findings: Secondary | ICD-10-CM

## 2013-08-02 DIAGNOSIS — E119 Type 2 diabetes mellitus without complications: Secondary | ICD-10-CM

## 2013-08-02 MED ORDER — LEVONORGESTREL 20 MCG/24HR IU IUD: 1.0000 | INTRAUTERINE_SYSTEM | Freq: Once | INTRAUTERINE | Status: AC

## 2013-08-02 NOTE — Patient Instructions (Addendum)
It was good to see you today.  We have reviewed your prior records including labs and tests today  Health Maintenance reviewed - pneumonia vaccine updated today -all other recommended immunizations and age-appropriate screenings are up-to-date.  we will send to your prior provider(s) for "release of records" as discussed today.  Medications reviewed and updated, no changes recommended at this time.  Please schedule followup in 6-12 months for semi/annual exam and labs, call sooner if problems.  Health Maintenance, Female A healthy lifestyle and preventative care can promote health and wellness.  Maintain regular health, dental, and eye exams.  Eat a healthy diet. Foods like vegetables, fruits, whole grains, low-fat dairy products, and lean protein foods contain the nutrients you need without too many calories. Decrease your intake of foods high in solid fats, added sugars, and salt. Get information about a proper diet from your caregiver, if necessary.  Regular physical exercise is one of the most important things you can do for your health. Most adults should get at least 150 minutes of moderate-intensity exercise (any activity that increases your heart rate and causes you to sweat) each week. In addition, most adults need muscle-strengthening exercises on 2 or more days a week.   Maintain a healthy weight. The body mass index (BMI) is a screening tool to identify possible weight problems. It provides an estimate of body fat based on height and weight. Your caregiver can help determine your BMI, and can help you achieve or maintain a healthy weight. For adults 20 years and older:  A BMI below 18.5 is considered underweight.  A BMI of 18.5 to 24.9 is normal.  A BMI of 25 to 29.9 is considered overweight.  A BMI of 30 and above is considered obese.  Maintain normal blood lipids and cholesterol by exercising and minimizing your intake of saturated fat. Eat a balanced diet with plenty of  fruits and vegetables. Blood tests for lipids and cholesterol should begin at age 57 and be repeated every 5 years. If your lipid or cholesterol levels are high, you are over 50, or you are a high risk for heart disease, you may need your cholesterol levels checked more frequently.Ongoing high lipid and cholesterol levels should be treated with medicines if diet and exercise are not effective.  If you smoke, find out from your caregiver how to quit. If you do not use tobacco, do not start.  Lung cancer screening is recommended for adults aged 63 80 years who are at high risk for developing lung cancer because of a history of smoking. Yearly low-dose computed tomography (CT) is recommended for people who have at least a 30-pack-year history of smoking and are a current smoker or have quit within the past 15 years. A pack year of smoking is smoking an average of 1 pack of cigarettes a day for 1 year (for example: 1 pack a day for 30 years or 2 packs a day for 15 years). Yearly screening should continue until the smoker has stopped smoking for at least 15 years. Yearly screening should also be stopped for people who develop a health problem that would prevent them from having lung cancer treatment.  If you are pregnant, do not drink alcohol. If you are breastfeeding, be very cautious about drinking alcohol. If you are not pregnant and choose to drink alcohol, do not exceed 1 drink per day. One drink is considered to be 12 ounces (355 mL) of beer, 5 ounces (148 mL) of wine, or 1.5  ounces (44 mL) of liquor.  Avoid use of street drugs. Do not share needles with anyone. Ask for help if you need support or instructions about stopping the use of drugs.  High blood pressure causes heart disease and increases the risk of stroke. Blood pressure should be checked at least every 1 to 2 years. Ongoing high blood pressure should be treated with medicines, if weight loss and exercise are not effective.  If you are 54 to  34 years old, ask your caregiver if you should take aspirin to prevent strokes.  Diabetes screening involves taking a blood sample to check your fasting blood sugar level. This should be done once every 3 years, after age 44, if you are within normal weight and without risk factors for diabetes. Testing should be considered at a younger age or be carried out more frequently if you are overweight and have at least 1 risk factor for diabetes.  Breast cancer screening is essential preventative care for women. You should practice "breast self-awareness." This means understanding the normal appearance and feel of your breasts and may include breast self-examination. Any changes detected, no matter how small, should be reported to a caregiver. Women in their 24s and 30s should have a clinical breast exam (CBE) by a caregiver as part of a regular health exam every 1 to 3 years. After age 18, women should have a CBE every year. Starting at age 6, women should consider having a mammogram (breast X-ray) every year. Women who have a family history of breast cancer should talk to their caregiver about genetic screening. Women at a high risk of breast cancer should talk to their caregiver about having an MRI and a mammogram every year.  Breast cancer gene (BRCA)-related cancer risk assessment is recommended for women who have family members with BRCA-related cancers. BRCA-related cancers include breast, ovarian, tubal, and peritoneal cancers. Having family members with these cancers may be associated with an increased risk for harmful changes (mutations) in the breast cancer genes BRCA1 and BRCA2. Results of the assessment will determine the need for genetic counseling and BRCA1 and BRCA2 testing.  The Pap test is a screening test for cervical cancer. Women should have a Pap test starting at age 77. Between ages 81 and 42, Pap tests should be repeated every 2 years. Beginning at age 94, you should have a Pap test every  3 years as long as the past 3 Pap tests have been normal. If you had a hysterectomy for a problem that was not cancer or a condition that could lead to cancer, then you no longer need Pap tests. If you are between ages 9 and 29, and you have had normal Pap tests going back 10 years, you no longer need Pap tests. If you have had past treatment for cervical cancer or a condition that could lead to cancer, you need Pap tests and screening for cancer for at least 20 years after your treatment. If Pap tests have been discontinued, risk factors (such as a new sexual partner) need to be reassessed to determine if screening should be resumed. Some women have medical problems that increase the chance of getting cervical cancer. In these cases, your caregiver may recommend more frequent screening and Pap tests.  The human papillomavirus (HPV) test is an additional test that may be used for cervical cancer screening. The HPV test looks for the virus that can cause the cell changes on the cervix. The cells collected during the Pap test  can be tested for HPV. The HPV test could be used to screen women aged 74 years and older, and should be used in women of any age who have unclear Pap test results. After the age of 66, women should have HPV testing at the same frequency as a Pap test.  Colorectal cancer can be detected and often prevented. Most routine colorectal cancer screening begins at the age of 99 and continues through age 50. However, your caregiver may recommend screening at an earlier age if you have risk factors for colon cancer. On a yearly basis, your caregiver may provide home test kits to check for hidden blood in the stool. Use of a small camera at the end of a tube, to directly examine the colon (sigmoidoscopy or colonoscopy), can detect the earliest forms of colorectal cancer. Talk to your caregiver about this at age 54, when routine screening begins. Direct examination of the colon should be repeated every  5 to 10 years through age 60, unless early forms of pre-cancerous polyps or small growths are found.  Hepatitis C blood testing is recommended for all people born from [birth year] through 1966 and any individual with known risks for hepatitis C.  Practice safe sex. Use condoms and avoid high-risk sexual practices to reduce the spread of sexually transmitted infections (STIs). Sexually active women aged 68 and younger should be checked for Chlamydia, which is a common sexually transmitted infection. Older women with new or multiple partners should also be tested for Chlamydia. Testing for other STIs is recommended if you are sexually active and at increased risk.  Osteoporosis is a disease in which the bones lose minerals and strength with aging. This can result in serious bone fractures. The risk of osteoporosis can be identified using a bone density scan. Women ages 47 and over and women at risk for fractures or osteoporosis should discuss screening with their caregivers. Ask your caregiver whether you should be taking a calcium supplement or vitamin D to reduce the rate of osteoporosis.  Menopause can be associated with physical symptoms and risks. Hormone replacement therapy is available to decrease symptoms and risks. You should talk to your caregiver about whether hormone replacement therapy is right for you.  Use sunscreen. Apply sunscreen liberally and repeatedly throughout the day. You should seek shade when your shadow is shorter than you. Protect yourself by wearing long sleeves, pants, a wide-brimmed hat, and sunglasses year round, whenever you are outdoors.  Notify your caregiver of new moles or changes in moles, especially if there is a change in shape or color. Also notify your caregiver if a mole is larger than the size of a pencil eraser.  Stay current with your immunizations. Document Released: 10/18/2010 Document Revised: 07/30/2012 Document Reviewed: 10/18/2010 [facility] Patient  Information 2014 [place].

## 2013-08-02 NOTE — Assessment & Plan Note (Signed)
On statin - The current medical regimen is effective;  continue present plan and medications.  

## 2013-08-02 NOTE — Progress Notes (Signed)
Subjective:    Patient ID: Ana ItoLashonda R [name], female    DOB: 09/19/1980, 33 y.o.   MRN: [id number]  HPI  New patient to me, here to establish with a new primary care provider patient is here today for annual physical. Patient feels well overall  Reviewed chronic medical issues: type 2 diabetes, dyslipidemia, obesity  Past Medical History  Diagnosis Date  . Diabetes mellitus without complication     Type II  . Hypertension   . PCOS (polycystic ovarian syndrome)   . DM2 (diabetes mellitus, type 2) 03/07/2013  . Elevated blood pressure reading 03/07/2013  . Frequent headaches 03/07/2013  . Other and unspecified hyperlipidemia 03/07/2013   Family History  Problem Relation Age of Onset  . Diabetes Mother     Type II  . Thyroid disease Mother   . Hypertension Mother   . Hyperlipidemia Mother   . Diabetes Father     TYPE II  . Hypertension Father   . Hyperlipidemia Father   . Heart disease Father   . Hyperlipidemia Maternal Grandmother   . Hypertension Maternal Grandmother   . Drug abuse Maternal Grandmother   . Obesity Maternal Grandmother   . Diabetes Maternal Grandmother   . Cancer Paternal Grandmother     colon cancer  . Diabetes Paternal Grandmother    History  Substance Use Topics  . Smoking status: Never Smoker   . Smokeless tobacco: Not on file  . Alcohol Use: No    Review of Systems  Constitutional: Negative for fatigue and unexpected weight change.  Respiratory: Negative for cough, shortness of breath and wheezing.   Cardiovascular: Negative for chest pain, palpitations and leg swelling.  Gastrointestinal: Negative for nausea, abdominal pain and diarrhea.  Neurological: Negative for dizziness, weakness, light-headedness and headaches.  Psychiatric/Behavioral: Negative for dysphoric mood. The patient is not nervous/anxious.   All other systems reviewed and are negative.      Objective:   Physical Exam  BP 122/90  Pulse 94  Temp(Src) 97.7 F (36.5  C) (Oral)  Ht 5\' 2"  (1.575 m)  Wt 208 lb 6.4 oz (94.53 kg)  BMI 38.11 kg/m2  SpO2 97% Wt Readings from Last 3 Encounters:  08/02/13 208 lb 6.4 oz (94.53 kg)  07/11/13 210 lb (95.255 kg)  03/07/13 214 lb (97.07 kg)   Constitutional: She is obese, but appears well-developed and well-nourished. No distress.  HENT: Head: Normocephalic and atraumatic. Ears: B TMs ok, no erythema or effusion; Nose: Nose normal. Mouth/Throat: Oropharynx is clear and moist. No oropharyngeal exudate.  Eyes: Conjunctivae and EOM are normal. Pupils are equal, round, and reactive to light. No scleral icterus.  Neck: Normal range of motion. Neck supple. No JVD present. No thyromegaly present.  Cardiovascular: Normal rate, regular rhythm and normal heart sounds.  No murmur heard. No BLE edema. Pulmonary/Chest: Effort normal and breath sounds normal. No respiratory distress. She has no wheezes.  Abdominal: Soft. Bowel sounds are normal. She exhibits no distension. There is no tenderness. no masses Musculoskeletal: Normal range of motion, no joint effusions. No gross deformities Neurological: She is alert and oriented to person, place, and time. No cranial nerve deficit. Coordination, balance, strength, speech and gait are normal.  Skin: Skin is warm and dry. No rash noted. No erythema.  Psychiatric: She has a normal mood and affect. Her behavior is normal. Judgment and thought content normal.    Lab Results  Component Value Date   WBC 14.1* 10/03/2008   HGB 8.7* 10/03/2008  HCT 26.0* 10/03/2008   PLT 247 10/03/2008   GLUCOSE 84 03/07/2013   CHOL 198 03/07/2013   TRIG 208.0* 03/07/2013   HDL 39.10 03/07/2013   LDLDIRECT 117.8 03/07/2013   ALT 31 03/07/2013   AST 38* 03/07/2013   NA 137 03/07/2013   K 4.1 03/07/2013   CL 104 03/07/2013   CREATININE 1.1 03/07/2013   BUN 14 03/07/2013   CO2 25 03/07/2013   TSH 1.025 * 10/01/2008   INR 1.0 10/01/2008   HGBA1C 6.6* 07/11/2013   MICROALBUR 12.3* 07/11/2013    [place]  Ob Comp + 14 Wk  10/03/2008   OBSTETRICAL ULTRASOUND:  This ultrasound was performed in [facility], and the AS OB/GYN report will be [facility] PACS.     IMPRESSION:    AS OB/GYN has also been faxed to the ordering physician.           Provider: Nena PolioSusan Kennedy  [place] Ob Transvaginal Modify  10/03/2008   OBSTETRICAL ULTRASOUND:  This ultrasound was performed in [facility], and the AS OB/GYN report will be stored to the [facility] PACS.     IMPRESSION:    AS OB/GYN has also been faxed to the ordering physician.           Provider: Nena PolioSusan Kennedy      Assessment & Plan:   CPX/v70.0 - Patient has been counseled on age-appropriate routine health concerns for screening and prevention. These are reviewed and up-to-date. Immunizations are up-to-date or declined. Labs reviewed. Send for ROI from prior PCP  Problem List Items Addressed This Visit   DM2 (diabetes mellitus, type 2) (Chronic)      Use of borderline, formally diagnosed with type 2 01/2103 Following with endocrinology for same Reviewed recent labs, indication for aspirin, statin, ARB/ACE Patient has scheduled followup with ophthalmology Reports home CBGs have much improved with current regimen Follow with endocrinologist q3-6 months as planned No changes recommended by me Lab Results  Component Value Date   HGBA1C 6.6* 07/11/2013       Other and unspecified hyperlipidemia (Chronic)     On statin  The current medical regimen is effective;  continue present plan and medications.      Other Visit Diagnoses   Routine general medical examination at a health care facility    -  Primary    Need for prophylactic vaccination against Streptococcus pneumoniae (pneumococcus)        Relevant Orders       Pneumococcal polysaccharide vaccine 23-valent greater than or equal to 2yo subcutaneous/IM (Completed)

## 2013-08-02 NOTE — Assessment & Plan Note (Signed)
Use of borderline, formally diagnosed with type 2 01/2103 Following with endocrinology for same Reviewed recent labs, indication for aspirin, statin, ARB/ACE Patient has scheduled followup with ophthalmology Reports home CBGs have much improved with current regimen Follow with endocrinologist q3-6 months as planned No changes recommended by me Lab Results  Component Value Date   HGBA1C 6.6* 07/11/2013

## 2013-08-14 ENCOUNTER — Telehealth: Payer: Self-pay

## 2013-08-14 ENCOUNTER — Other Ambulatory Visit: Payer: Self-pay | Admitting: Internal Medicine

## 2013-08-14 NOTE — Telephone Encounter (Signed)
Relevant patient education assigned to patient using Emmi. ° °

## 2013-09-24 ENCOUNTER — Other Ambulatory Visit: Payer: Self-pay | Admitting: Internal Medicine

## 2013-09-26 ENCOUNTER — Telehealth: Payer: Self-pay | Admitting: *Deleted

## 2013-09-26 ENCOUNTER — Other Ambulatory Visit: Payer: Self-pay | Admitting: *Deleted

## 2013-09-26 MED ORDER — GLUCOSE BLOOD VI STRP: ORAL_STRIP | Status: AC

## 2013-09-26 MED ORDER — FLUCONAZOLE 150 MG PO TABS: 150.0000 mg | ORAL_TABLET | Freq: Once | ORAL | Status: DC

## 2013-09-26 NOTE — Telephone Encounter (Signed)
Called pt and advised her per Dr Charlean Sanfilippo note. Pt understand. Pt requested another rx for test strips. Done.

## 2013-09-26 NOTE — Telephone Encounter (Signed)
Agree with coming off Invokana. Please call in Diflucan 150 mg x 11 with 1 refill. Continue just on Metformin but let me know about the sugars in 2 weeks. We may need to add Januvia.

## 2013-09-26 NOTE — Telephone Encounter (Signed)
Pt called stating that she has a yeast infection again. She did the OTC treatment last time she called and she saw her GYN a few weeks ago, she had a yeast infection then. They prescribed her diflucan, she took it and the infection went away, but it is now back. Pt stated her GYN dr said she may have to come off the invokana. Please advise.

## 2013-10-10 ENCOUNTER — Ambulatory Visit (INDEPENDENT_AMBULATORY_CARE_PROVIDER_SITE_OTHER): Payer: BC Managed Care – PPO | Admitting: Internal Medicine

## 2013-10-10 ENCOUNTER — Encounter: Payer: Self-pay | Admitting: Internal Medicine

## 2013-10-10 VITALS — BP 108/68 | HR 110 | Temp 98.2°F | Resp 12 | Wt 208.0 lb

## 2013-10-10 DIAGNOSIS — R030 Elevated blood-pressure reading, without diagnosis of hypertension: Secondary | ICD-10-CM

## 2013-10-10 DIAGNOSIS — E785 Hyperlipidemia, unspecified: Secondary | ICD-10-CM

## 2013-10-10 DIAGNOSIS — E119 Type 2 diabetes mellitus without complications: Secondary | ICD-10-CM

## 2013-10-10 LAB — HEMOGLOBIN A1C: HEMOGLOBIN A1C: 6.4 % (ref 4.6–6.5)

## 2013-10-10 MED ORDER — METFORMIN HCL 1000 MG PO TABS: 1000.0000 mg | ORAL_TABLET | Freq: Two times a day (BID) | ORAL | Status: DC

## 2013-10-10 MED ORDER — PRAVASTATIN SODIUM 20 MG PO TABS: ORAL_TABLET | ORAL | Status: DC

## 2013-10-10 NOTE — Progress Notes (Signed)
Patient ID: Ana ItoLashonda R Gittleman, female   DOB: 02/15/1981, 33 y.o.   MRN: [id number]  HPI: Ana [name] is a 33 y.o.-year-old female, returning for f/u DM2, dx summer 2014, non-insulin-dependent, uncontrolled, without complications. Last visit 3 mo ago.    Last hemoglobin A1c was: Lab Results  Component Value Date   HGBA1C 6.6* 07/11/2013   HGBA1C 7.9* 03/07/2013   Previously 10.5% (10/2012). Previously 6.5%.   Pt is on a regimen of: - Metformin 1000 mg po bid >> nausea and diarrhea better We had to stop Invokana 100 mg b/c repeated yeast inf. (09/26/2013) She was on JanuMet when she was prediabetic >> tolerated it well.   She has a One Touch Ultra meter - checking 3-5x a day after stopping Invokana: - am: 71-122 >> 71-121 >> 80-111 - 2h after b'fast: 98-118 - lunch: 97-123 - 2h after lunch: 111-132 - dinner: 119-123 - 2h after dinner: 120, 130 Highest 132. No lows.  Pt's meals are: - Breakfast: bisquit - Lunch: fast food - Dinner:chicken, veg, starch - Snacks: rarely Drinks regular sodas 2x a day.  I referred her to nutrition in the past, needs to reschedule appt.  - + mild CKD, last BUN/creatinine:  Lab Results  Component Value Date   BUN 14 03/07/2013   CREATININE 1.1 03/07/2013  Last ACR: normal, but had proteinuria after pregnancy 2010.  - Pt has HL. Last lipids: Lab Results  Component Value Date   CHOL 198 03/07/2013   HDL 39.10 03/07/2013   LDLDIRECT 117.8 03/07/2013   TRIG 208.0* 03/07/2013   CHOLHDL 5 03/07/2013  On Pravastatin 10 >> 20 in 02/2013. - she is up to date with eye exams. - no numbness and tingling in her feet.  Has increased thirst and urination - not at night.  She also has a history of PCOS, HL, HTN.   I reviewed pt's medications, allergies, PMH, social hx, family hx and no changes required, except as mentioned above.  ROS: Constitutional: no weight gain/loss, no fatigue, no subjective hyperthermia/hypothermia Eyes: no blurry  vision, no xerophthalmia ENT: no sore throat, no nodules palpated in throat, no dysphagia/odynophagia, no hoarseness Cardiovascular: no CP/SOB/palpitations/leg swelling Respiratory: no cough/SOB Gastrointestinal: no N/V/D/C Musculoskeletal: no muscle/joint aches Skin: no rashes Neurological: no tremors/numbness/tingling/dizziness   PE: BP 108/68  Pulse 110  Temp(Src) 98.2 F (36.8 C) (Oral)  Resp 12  Wt 208 lb (94.348 kg)  SpO2 97% Wt Readings from Last 3 Encounters:  10/10/13 208 lb (94.348 kg)  08/02/13 208 lb 6.4 oz (94.53 kg)  07/11/13 210 lb (95.255 kg)   Constitutional: obese, in NAD Eyes: PERRLA, EOMI, no exophthalmos ENT: moist mucous membranes, no thyromegaly, no cervical lymphadenopathy Cardiovascular: RRR, No MRG Respiratory: CTA B Gastrointestinal: abdomen soft, NT, ND, BS+ Musculoskeletal: no deformities, strength intact in all 4 Skin: moist, warm, + acanthosis nigricans on neck Neurological: no tremor with outstretched hands, DTR normal in all 4  ASSESSMENT: 1. DM2, non-insulin-dependent, uncontrolled, without complications  2. High BP reading  3. HL  PLAN:  1. Patient with recently dx-ed diabetes, on oral regimen, with great improvement in DM control despite having to stop Invokana - I suggested to: continue Metformin 1000 mg 2x a day - check sugars daily, rotating checks - will check Hba1c today.  - Return to clinic in 3 months with sugar log   2. High BP reading - No dx of HTN - BP normal today  3. HL - On 20 mg Pravastatin  -  refilled Pravastatin - we will check Lipids and CMP at next visit  Lab Results  Component Value Date   HGBA1C 6.4 10/10/2013   Excellent A1c. Can stay off Invokana.

## 2013-10-10 NOTE — Patient Instructions (Signed)
Please stop at the lab. I will release the labs in MyChart. Continue Metformin 1000 mg 2x a day.  Please come back for a follow-up appointment in 3 months.

## 2013-12-18 ENCOUNTER — Encounter: Payer: Self-pay | Admitting: Internal Medicine

## 2013-12-18 ENCOUNTER — Telehealth: Payer: Self-pay | Admitting: *Deleted

## 2013-12-18 ENCOUNTER — Ambulatory Visit (INDEPENDENT_AMBULATORY_CARE_PROVIDER_SITE_OTHER): Payer: BC Managed Care – PPO | Admitting: Internal Medicine

## 2013-12-18 VITALS — BP 120/68 | HR 138 | Temp 98.1°F | Resp 12 | Wt 194.0 lb

## 2013-12-18 DIAGNOSIS — R000 Tachycardia, unspecified: Secondary | ICD-10-CM

## 2013-12-18 DIAGNOSIS — E119 Type 2 diabetes mellitus without complications: Secondary | ICD-10-CM

## 2013-12-18 LAB — T4, FREE: FREE T4: 1.97 ng/dL — AB (ref 0.60–1.60)

## 2013-12-18 LAB — SEDIMENTATION RATE: Sed Rate: 37 mm/hr — ABNORMAL HIGH (ref 0–22)

## 2013-12-18 LAB — TSH: TSH: 0.02 u[IU]/mL — ABNORMAL LOW (ref 0.35–4.50)

## 2013-12-18 LAB — T3, FREE: T3, Free: 18.7 pg/mL — ABNORMAL HIGH (ref 2.3–4.2)

## 2013-12-18 MED ORDER — METOPROLOL SUCCINATE ER 25 MG PO TB24: 25.0000 mg | ORAL_TABLET | Freq: Every day | ORAL | Status: DC

## 2013-12-18 NOTE — Telephone Encounter (Signed)
Pt called back. Advised her per your MyChart message. Pt undestood and will call back on Friday to let us know her pulse rate. Be advised.

## 2013-12-18 NOTE — Progress Notes (Signed)
Patient ID: Ana [name], female   DOB: Apr 13, 1981, 33 y.o.   MRN: [id number]  HPI: Ana [name] is a 33 y.o.-year-old female, returning for f/u DM2, dx summer 2014, non-insulin-dependent, uncontrolled, without complications. Last visit 2.5 mo ago.   She went to UC last week >> sore throat >> r/o strep throat.   She has palpitations (pulse high today - 138 bpm), tremors, heat intolerance, and lost 14 lbs since last visit 2.5 mo ago!   Last hemoglobin A1c was: Lab Results  Component Value Date   HGBA1C 6.4 10/10/2013   HGBA1C 6.6* 07/11/2013   HGBA1C 7.9* 03/07/2013  Previously 10.5% (10/2012).  Previously 6.5%.   Pt is on a regimen of: - Metformin 1000 mg po bid We had to stop Invokana 100 mg b/c repeated yeast inf. (09/26/2013) She was on JanuMet when she was prediabetic >> tolerated it well.   She has a One Touch Ultra meter - checking 3x a week after stopping Invokana: - am: 71-122 >> 71-121 >> 80-111 >> 90-128 - 2h after b'fast: 98-118 >> n/c - lunch: 97-123 >> 90s - 2h after lunch: 111-132 >> 128-148 (1h after lunch) - dinner: 119-123 >> n/c - 2h after dinner: 120, 130 >> 95-114 Highest 148. No lows. Lowest 90.  Pt's meals are: - Breakfast: bisquit - Lunch: fast food - Dinner:chicken, veg, starch - Snacks: rarely Drinks regular sodas 2x a day.  I referred her to nutrition in the past, needs to reschedule appt.  - + mild CKD, last BUN/creatinine:  Lab Results  Component Value Date   BUN 14 03/07/2013   CREATININE 1.1 03/07/2013  Last ACR: normal, but had proteinuria after pregnancy 2010.  - Pt has HL. Last lipids: Lab Results  Component Value Date   CHOL 198 03/07/2013   HDL 39.10 03/07/2013   LDLDIRECT 117.8 03/07/2013   TRIG 208.0* 03/07/2013   CHOLHDL 5 03/07/2013  On Pravastatin 10 >> 20 in 02/2013. - she is up to date with eye exams - 02/2013.  - no numbness and tingling in her feet.  She also has a history of PCOS, HL, HTN.   I reviewed  pt's medications, allergies, PMH, social hx, family hx and no changes required, except as mentioned above.  ROS: Constitutional: + weight loss, no fatigue, + subjective hyperthermia Eyes: no blurry vision, no xerophthalmia ENT: + sore throat, no nodules palpated in throat, no dysphagia/odynophagia, no hoarseness Cardiovascular: no CP/+ SOB/+ palpitations/no leg swelling Respiratory: + cough/+ SOB Gastrointestinal: no N/V/D/C Musculoskeletal: no muscle/joint aches Skin: no rashes Neurological: no tremors/numbness/tingling/dizziness  PE: BP 120/68  Pulse 138  Temp(Src) 98.1 F (36.7 C) (Oral)  Resp 12  Wt 194 lb (87.998 kg)  SpO2 98% Body mass index is 35.47 kg/(m^2).  Wt Readings from Last 3 Encounters:  12/18/13 194 lb (87.998 kg)  10/10/13 208 lb (94.348 kg)  08/02/13 208 lb 6.4 oz (94.53 kg)   Constitutional: obese, in NAD Eyes: PERRLA, EOMI, no exophthalmos ENT: moist mucous membranes, no thyromegaly, no cervical lymphadenopathy Cardiovascular: tachycardia RR, No MRG Respiratory: CTA B Gastrointestinal: abdomen soft, NT, ND, BS+ Musculoskeletal: no deformities, strength intact in all 4 Skin: moist, warm, + acanthosis nigricans on neck Neurological: + tremor with outstretched hands, DTR normal in all 4  ASSESSMENT: 1. DM2, non-insulin-dependent, uncontrolled, without complications  2. Tachycardia  PLAN:  1. Patient with recently dx-ed diabetes, on oral regimen, with great improvement in DM control - I suggested to: continue Metformin 1000 mg 2x  a day - check sugars daily, rotating checks - will check Hba1c at next visit Return in about 2 months (around 02/17/2014).  2. Tachycardia - BP normal today, but high pulse (138!) - with her sxs of heat intolerance, tremors, and weight loss >> suspect thyrotoxicosis possibly 2/2 subacute thyroiditis after URI >> will check: Orders Placed This Encounter  Procedures  . TSH  . T4, free  . T3, free  . Sed Rate (ESR)  .  Thyroid Stimulating Immunoglobulin  Advised to start Toprol XL 25 mg after I call her with the results.  Msg sent: Dear Ms Spina, Your thyroid tests show that you do have an excess of thyroid hormones in your system and it appears that you have an episode of subacute thyroiditis, based on the high ESR (inflammation test). Please start the Toprol XL today and call me on Friday (or message me through mychart) to tell me how you feel and how high your pulse is. If your pulse is still >90, will need to double the dose of the Toprol. (I have tried to call you today but did not want to leave a message). Sincerely, [name] MD  TSI still pending.

## 2013-12-18 NOTE — Patient Instructions (Signed)
Please stop at the lab. Please start Toprol XL 25 mg daily. Please return in 2 months.

## 2013-12-26 LAB — THYROID STIMULATING IMMUNOGLOBULIN: TSI: 188 %{baseline} — AB (ref <140)

## 2013-12-27 ENCOUNTER — Encounter: Payer: Self-pay | Admitting: Internal Medicine

## 2013-12-27 ENCOUNTER — Other Ambulatory Visit: Payer: Self-pay | Admitting: Internal Medicine

## 2013-12-27 ENCOUNTER — Telehealth: Payer: Self-pay | Admitting: *Deleted

## 2013-12-27 ENCOUNTER — Telehealth: Payer: Self-pay | Admitting: Internal Medicine

## 2013-12-27 DIAGNOSIS — E0590 Thyrotoxicosis, unspecified without thyrotoxic crisis or storm: Secondary | ICD-10-CM

## 2013-12-27 MED ORDER — METOPROLOL SUCCINATE ER 25 MG PO TB24: 50.0000 mg | ORAL_TABLET | Freq: Every day | ORAL | Status: DC

## 2013-12-27 NOTE — Telephone Encounter (Signed)
Patient ask if you would call her at work.  She wanted to talk with you.  Work # [phone]

## 2013-12-27 NOTE — Telephone Encounter (Signed)
Pt says her heart rate is still high some, she can still feel her heart beating real fast. Pulse has been in 90's, since Tues. Please advise if pt needs to increase her medication. Pt said you can respond to her through MyChart and if she needs a new rx, [facility] [address]. Thank you.

## 2013-12-27 NOTE — Telephone Encounter (Signed)
Tried 2 times, unable to get through to pt. Was put on hold for over 5 min. Hung up.

## 2014-01-30 ENCOUNTER — Telehealth: Payer: Self-pay | Admitting: *Deleted

## 2014-01-30 ENCOUNTER — Other Ambulatory Visit: Payer: Self-pay | Admitting: *Deleted

## 2014-01-30 DIAGNOSIS — R000 Tachycardia, unspecified: Secondary | ICD-10-CM

## 2014-01-30 NOTE — Telephone Encounter (Signed)
Pt called stating that she has been sick at least 2 days a week, she has diarrhea and the shakes. 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 Pt wants Dr Elvera LennoxGherghe to help her. Please advise.

## 2014-01-30 NOTE — Telephone Encounter (Signed)
Ana [name], let's order a new set of thyroid tests: TSH, free T4, free T3, and also ESR. I will see what we can do after I have the results. If these are improved, she will need to contact her PCP.

## 2014-01-30 NOTE — Telephone Encounter (Signed)
Called pt and advised her per Dr Charlean SanfilippoGherghe's note. Pt understood. Pt to come in tomorrow am to have labs done. Be advised.

## 2014-01-31 ENCOUNTER — Other Ambulatory Visit (INDEPENDENT_AMBULATORY_CARE_PROVIDER_SITE_OTHER): Payer: BC Managed Care – PPO

## 2014-01-31 ENCOUNTER — Other Ambulatory Visit: Payer: Self-pay | Admitting: *Deleted

## 2014-01-31 DIAGNOSIS — R000 Tachycardia, unspecified: Secondary | ICD-10-CM

## 2014-01-31 LAB — T4, FREE: Free T4: 2.93 ng/dL — ABNORMAL HIGH (ref 0.60–1.60)

## 2014-01-31 LAB — T3, FREE: T3, Free: 24.1 pg/mL — ABNORMAL HIGH (ref 2.3–4.2)

## 2014-01-31 LAB — TSH: TSH: 0.01 u[IU]/mL — ABNORMAL LOW (ref 0.35–4.50)

## 2014-01-31 LAB — SEDIMENTATION RATE: SED RATE: 26 mm/h — AB (ref 0–22)

## 2014-01-31 MED ORDER — METHIMAZOLE 10 MG PO TABS: 10.0000 mg | ORAL_TABLET | Freq: Two times a day (BID) | ORAL | Status: DC

## 2014-02-05 ENCOUNTER — Encounter: Payer: Self-pay | Admitting: Internal Medicine

## 2014-02-05 ENCOUNTER — Ambulatory Visit: Payer: BC Managed Care – PPO | Admitting: Internal Medicine

## 2014-02-05 ENCOUNTER — Ambulatory Visit (INDEPENDENT_AMBULATORY_CARE_PROVIDER_SITE_OTHER): Payer: BC Managed Care – PPO | Admitting: Internal Medicine

## 2014-02-05 ENCOUNTER — Other Ambulatory Visit (INDEPENDENT_AMBULATORY_CARE_PROVIDER_SITE_OTHER): Payer: BC Managed Care – PPO

## 2014-02-05 VITALS — BP 128/88 | HR 125 | Temp 98.4°F | Resp 14 | Wt 177.8 lb

## 2014-02-05 DIAGNOSIS — E785 Hyperlipidemia, unspecified: Secondary | ICD-10-CM

## 2014-02-05 DIAGNOSIS — Z23 Encounter for immunization: Secondary | ICD-10-CM

## 2014-02-05 DIAGNOSIS — E0500 Thyrotoxicosis with diffuse goiter without thyrotoxic crisis or storm: Secondary | ICD-10-CM

## 2014-02-05 DIAGNOSIS — E119 Type 2 diabetes mellitus without complications: Secondary | ICD-10-CM

## 2014-02-05 LAB — LIPID PANEL
CHOL/HDL RATIO: 3
Cholesterol: 125 mg/dL (ref 0–200)
HDL: 38 mg/dL — ABNORMAL LOW (ref >39.00)
LDL Cholesterol: 58 mg/dL (ref 0–99)
NONHDL: 87
Triglycerides: 145 mg/dL (ref 0.0–149.0)
VLDL: 29 mg/dL (ref 0.0–40.0)

## 2014-02-05 LAB — HEPATIC FUNCTION PANEL
ALBUMIN: 3.2 g/dL — AB (ref 3.5–5.2)
ALT: 26 U/L (ref 0–35)
AST: 29 U/L (ref 0–37)
Alkaline Phosphatase: 71 U/L (ref 39–117)
Bilirubin, Direct: 0.1 mg/dL (ref 0.0–0.3)
Total Bilirubin: 0.6 mg/dL (ref 0.2–1.2)
Total Protein: 7.5 g/dL (ref 6.0–8.3)

## 2014-02-05 LAB — MICROALBUMIN / CREATININE URINE RATIO
Creatinine,U: 68.7 mg/dL
MICROALB UR: 3.9 mg/dL — AB (ref 0.0–1.9)
Microalb Creat Ratio: 5.7 mg/g (ref 0.0–30.0)

## 2014-02-05 LAB — HEMOGLOBIN A1C: Hgb A1c MFr Bld: 6 % (ref 4.6–6.5)

## 2014-02-05 NOTE — Progress Notes (Signed)
   Subjective:    Patient ID: Ana [name], female    DOB: 05/18/1980, 33 y.o.   MRN: [id number]  HPI    She has been seen by Dr Elvera LennoxGherghe for thyroiditis associated with vomiting, tachycardia, tremor, and diarrhea. Her most recent thyroid function test were 01/31/14. TSH of 0.01 free T4-2 .93 and free T3 24.1, all elevated.  Her diabetes is also being monitored by her endocrinologist. Her last A1c was 6.4% on 10/10/13. Eye exam is due at this time. She's not seen a podiatrist.  She has been compliant with her statin. She is exercising 3-4 times per week. She is on a heart healthy diet. She's had continued weight loss in the context of the vomiting and diarrhea.    Review of Systems   Specifically denied are  chest pain,  dyspnea, or claudication.  Significant  memory deficit or myalgias not present.     Objective:   Physical Exam  Gen.:  well-nourished; in no acute distress Eyes: Extraocular motion intact; no lid lag or proptosis ,nystagmus is minimal Neck: full ROM; no masses ; thyroid enlarged 1.5-2 + w/o nodules  Heart: Normal rhythm and rapid rate without significant murmur, gallop, or extra heart sounds Lungs: Chest clear to auscultation without rales,rales, wheezes Neuro:Deep tendon reflexes are equal and 1.5-2+ limits; fine tremor  Skin: Warm and dry without significant lesions or rashes; no onycholysis. Striae over abdomen Lymphatic: no cervical or axillary LA Psych: Normally communicative and interactive; no abnormal mood or affect clinically.           Assessment & Plan:  See Current Assessment & Plan in Problem List under specific Diagnosis

## 2014-02-05 NOTE — Progress Notes (Signed)
Pre visit review using our clinic review tool, if applicable. No additional management support is needed unless otherwise documented below in the visit note. 

## 2014-02-05 NOTE — Patient Instructions (Signed)
Your next office appointment will be determined based upon review of your pending labs. Those instructions will be transmitted to you through My Chart . 

## 2014-02-18 ENCOUNTER — Ambulatory Visit (INDEPENDENT_AMBULATORY_CARE_PROVIDER_SITE_OTHER): Payer: BC Managed Care – PPO | Admitting: Internal Medicine

## 2014-02-18 ENCOUNTER — Encounter: Payer: Self-pay | Admitting: Internal Medicine

## 2014-02-18 VITALS — BP 112/68 | HR 116 | Temp 98.6°F | Resp 12 | Wt 176.0 lb

## 2014-02-18 DIAGNOSIS — E119 Type 2 diabetes mellitus without complications: Secondary | ICD-10-CM

## 2014-02-18 DIAGNOSIS — E0500 Thyrotoxicosis with diffuse goiter without thyrotoxic crisis or storm: Secondary | ICD-10-CM

## 2014-02-18 NOTE — Progress Notes (Addendum)
Patient ID: Ana [name], female   DOB: [birth year], 33 y.o.   MRN: [id number]  HPI: Ana [name] is a 33 y.o.-year-old female, returning for f/u DM2, dx summer 2014, non-insulin-dependent, uncontrolled, without complications. Last visit 2 mo ago.    DM2: Last hemoglobin A1c was: Lab Results  Component Value Date   HGBA1C 6.0 02/05/2014   HGBA1C 6.4 10/10/2013   HGBA1C 6.6* 07/11/2013  Previously 10.5% (10/2012).  Previously 6.5%.   Pt is on a regimen of: - Metformin 1000 mg po bid We had to stop Invokana 100 mg b/c repeated yeast inf. (09/26/2013) She was on JanuMet when she was prediabetic >> tolerated it well.   She has a One Touch Ultra meter - checking 3x a week after stopping Invokana: - am: 71-122 >> 71-121 >> 80-111 >> 90-128 >> 90-120s - 2h after b'fast: 98-118 >> n/c - lunch: 97-123 >> 90s >> 90-121 - 2h after lunch: 111-132 >> 128-148 (1h after lunch) >> n/c - dinner: 119-123 >> n/c >> 130-148 (snack before dinner) - 2h after dinner: 120, 130 >> 95-114 >> n/c Highest 148. No lows. Lowest 90.  Pt's meals are: - Breakfast: bisquit - Lunch: fast food - Dinner:chicken, veg, starch - Snacks: rarely Drinks regular sodas 2x a day.   - + mild CKD, last BUN/creatinine:  Lab Results  Component Value Date   BUN 14 03/07/2013   CREATININE 1.1 03/07/2013  Last ACR: normal, 5.7 at last check, but had proteinuria after pregnancy 2010.  - Pt has HL. Last lipids: Lab Results  Component Value Date   CHOL 125 02/05/2014   HDL 38.00* 02/05/2014   LDLCALC 58 02/05/2014   LDLDIRECT 117.8 03/07/2013   TRIG 145.0 02/05/2014   CHOLHDL 3 02/05/2014  On Pravastatin 10 >> 20 in 02/2013. - she is up to date with eye exams - 02/2013.  - no numbness and tingling in her feet.  Thyrotoxicosis: - latest TFTs: Lab Results  Component Value Date   TSH 0.01* 01/31/2014   TSH 0.02* 12/18/2013   TSH 1.025  10/01/2008   FREET4 2.93* 01/31/2014   FREET4 1.97* 12/18/2013    ESR was high >>  Lab Results  Component Value Date   ESRSEDRATE 26* 01/31/2014  so I initially believed that she had subacute thyroiditis >> We started Toprol XL 25 mg at last visit, but the sxs persisted and her TFTs worsened >> We also started MMI 10 mg bid after last set of labs returned.  She c/o: - weight loss (lost 40 lbs over 6 mo!!!), now weight stabilized - improved heat intolerance - palpitations (still has some) - still has some diarrhea/no more vomiting  She also has a history of PCOS, HL, HTN.   I reviewed pt's medications, allergies, PMH, social hx, family hx and no changes required, except as mentioned above.  ROS: Constitutional: see HPI Eyes: no blurry vision, no xerophthalmia ENT: no sore throat, + nodules palpated in throat, no dysphagia/odynophagia, no hoarseness Cardiovascular: no CP/SOB/+ palpitations/no leg swelling Respiratory: no cough/SOB Gastrointestinal: no N/V/+ D/no C Musculoskeletal: no muscle/joint aches Skin: no rashes Neurological: no tremors/numbness/tingling/dizziness  PE: BP 112/68 mmHg  Pulse 116  Temp(Src) 98.6 F (37 C) (Oral)  Resp 12  Wt 176 lb (79.833 kg)  SpO2 98% Body mass index is 32.18 kg/(m^2).  Wt Readings from Last 3 Encounters:  02/05/14 177 lb 12 oz (80.627 kg)  12/18/13 194 lb (87.998 kg)  10/10/13 208 lb (94.348 kg)   Constitutional:  obese, in NAD Eyes: PERRLA, EOMI, no exophthalmos ENT: moist mucous membranes, + thyromegaly, no cervical lymphadenopathy Cardiovascular: tachycardia, RR, No MRG Respiratory: CTA B Gastrointestinal: abdomen soft, NT, ND, BS+ Musculoskeletal: no deformities, strength intact in all 4 Skin: moist, warm, + acanthosis nigricans on neck Neurological: + tremor with outstretched hands, DTR normal in all 4  ASSESSMENT: 1. DM2, non-insulin-dependent, uncontrolled, without complications  2. Thyrotoxycosis  PLAN:  1. Patient with recently dx-ed diabetes, on oral regimen, with great  improvement in DM control - I suggested to: continue Metformin 1000 mg 2x a day - check sugars daily, rotating checks - got the flu vaccine this season - will check Hba1c today   2. Thyrotoxycosis - symptomatic at dx >> tachycardia, heat intolerance, tremors, and weight loss  - I initially believed this to be subacute thyroiditis based on the fact that the sxs started after she developed a URI and she had a high ESR >> started Toprol XL 25 mg >> increase to 50 mg a day today, as her pulse is still >100 bpm - since sxs persisted and TFTs worsened >> started MMI 10 mg bid >> she feels much better  Pt was advised to: Patient Instructions  Please come back for labs in 2 weeks.  Continue Metformin 1000 mg twice a day.  Increase Toprol XL to 50 mg daily.  Continue methimazole 10 mg twice a day. Please stop the Methimazole (Tapazole) and call us or your primary care doctor if you develop: - sore throat - fever - yellow skin - dark urine - light colored stools As we will then need to check your blood counts and liver tests.  I will order the thyroid uptake and scan and he would be called with the schedule. Please let me know if you are not called about the test in the next about 4 days.  Please return in 3 months.  03/07/2014  TFTs improving: Component     Latest Ref Rng 01/31/2014 03/06/2014  TSH     0.350 - 4.500 uIU/mL 0.01 (L) 0.009 (L)  Free T4     0.80 - 1.80 ng/dL 2.93 (H) 1.67  T3, Free     2.3 - 4.2 pg/mL 24.1 (H) 5.7 (H)   Will continue MMI for now at same dose >> repeat labs in 1.5 mo.  Thyroid Uptake and scan >> Graves ds: CLINICAL DATA: Thyrotoxicosis. Serum TSH 0.009.  EXAM: THYROID SCAN AND UPTAKE - 24 HOURS  TECHNIQUE: Following the per oral administration of I-131 sodium iodide, the patient returned at 24 hours and uptake measurements were acquired with the uptake probe centered on the neck. Thyroid imaging was performed following the intravenous  administration of the Tc-71mPertechnetate.  RADIOPHARMACEUTICALS: 15.3 microCuries I-131 Sodium Iodide and 10.0 mCi TC-920mertechnetate  COMPARISON: None  FINDINGS: The 24 hr uptake by the thyroid gland is 65%. Normal 24 hr uptake is 10-30%. On thyroid imaging, the thyroid activity appears diffusely increased. No focal hot or cold nodules are identified.  IMPRESSION: Increased 24 hr thyroid uptake with homogeneous activity on thyroid imaging consistent with diffuse toxic goiter (Graves disease).   Electronically Signed By: BiCamie Patience.D. On: 03/12/2014 15:32  Will continue with same dose of MMI for now. Will discuss about RAI tx at next visit.

## 2014-02-18 NOTE — Patient Instructions (Signed)
Please come back for labs in 2 weeks.  Continue Metformin 1000 mg twice a day.  Continue methimazole 10 mg twice a day. Please stop the Methimazole (Tapazole) and call us or your primary care doctor if you develop: - sore throat - fever - yellow skin - dark urine - light colored stools As we will then need to check your blood counts and liver tests.  I will order the thyroid uptake and scan and he would be called with the schedule. Please let me know if you are not called about the test in the next about 4 days.  Please return in 3 months.

## 2014-02-24 ENCOUNTER — Telehealth: Payer: Self-pay | Admitting: Internal Medicine

## 2014-02-24 NOTE — Telephone Encounter (Signed)
Call[facility] pt and [facility]. Pt understood.

## 2014-02-24 NOTE — Telephone Encounter (Signed)
Patient would like to know if she will be getting a call regarding her RAI treatments   Please advise patient   W: [phone] C: [phone]   Thank you

## 2014-03-05 ENCOUNTER — Ambulatory Visit: Payer: BC Managed Care – PPO | Admitting: Internal Medicine

## 2014-03-06 ENCOUNTER — Other Ambulatory Visit: Payer: Self-pay | Admitting: Internal Medicine

## 2014-03-06 ENCOUNTER — Other Ambulatory Visit: Payer: Self-pay | Admitting: Gynecology

## 2014-03-06 ENCOUNTER — Other Ambulatory Visit: Payer: BC Managed Care – PPO

## 2014-03-06 ENCOUNTER — Encounter (HOSPITAL_COMMUNITY): Admission: RE | Admit: 2014-03-06 | Payer: BC Managed Care – PPO | Source: Ambulatory Visit

## 2014-03-06 LAB — T3, FREE: T3, Free: 5.7 pg/mL — ABNORMAL HIGH (ref 2.3–4.2)

## 2014-03-06 LAB — TSH: TSH: 0.009 u[IU]/mL — AB (ref 0.350–4.500)

## 2014-03-06 LAB — T4, FREE: FREE T4: 1.67 ng/dL (ref 0.80–1.80)

## 2014-03-07 ENCOUNTER — Encounter (HOSPITAL_COMMUNITY): Payer: BC Managed Care – PPO

## 2014-03-07 LAB — CYTOLOGY - PAP

## 2014-03-07 NOTE — Addendum Note (Signed)
Addended by: Carlus PavlovGHERGHE, Terrika Zuver on: 03/07/2014 12:23 PM   Modules accepted: Orders

## 2014-03-11 ENCOUNTER — Ambulatory Visit (HOSPITAL_COMMUNITY)
Admission: RE | Admit: 2014-03-11 | Discharge: 2014-03-11 | Disposition: A | Discharge status: Home / Self Care | Payer: BC Managed Care – PPO | Source: Ambulatory Visit

## 2014-03-11 ENCOUNTER — Encounter (HOSPITAL_COMMUNITY)
Admission: RE | Admit: 2014-03-11 | Discharge: 2014-03-11 | Disposition: A | Discharge status: Home / Self Care | Payer: BC Managed Care – PPO | Source: Ambulatory Visit | Attending: Internal Medicine | Admitting: Internal Medicine

## 2014-03-11 ENCOUNTER — Ambulatory Visit (HOSPITAL_COMMUNITY)
Admission: RE | Admit: 2014-03-11 | Discharge: 2014-03-11 | Disposition: A | Discharge status: Home / Self Care | Payer: BC Managed Care – PPO | Source: Ambulatory Visit | Attending: Internal Medicine | Admitting: Internal Medicine

## 2014-03-11 DIAGNOSIS — E0500 Thyrotoxicosis with diffuse goiter without thyrotoxic crisis or storm: Secondary | ICD-10-CM

## 2014-03-11 MED ORDER — SODIUM IODIDE I 131 CAPSULE
15.3000 | Freq: Once | INTRAVENOUS | Status: AC | PRN
  Administered 2014-03-11: 15.3 via ORAL

## 2014-03-12 ENCOUNTER — Ambulatory Visit (HOSPITAL_COMMUNITY)
Admission: RE | Admit: 2014-03-12 | Discharge: 2014-03-12 | Disposition: A | Discharge status: Home / Self Care | Payer: BC Managed Care – PPO | Source: Ambulatory Visit

## 2014-03-12 ENCOUNTER — Other Ambulatory Visit: Payer: Self-pay | Admitting: Internal Medicine

## 2014-03-12 ENCOUNTER — Encounter (HOSPITAL_COMMUNITY)
Admission: RE | Admit: 2014-03-12 | Discharge: 2014-03-12 | Disposition: A | Discharge status: Home / Self Care | Payer: BC Managed Care – PPO | Source: Ambulatory Visit | Attending: Internal Medicine | Admitting: Internal Medicine

## 2014-03-12 DIAGNOSIS — E0500 Thyrotoxicosis with diffuse goiter without thyrotoxic crisis or storm: Secondary | ICD-10-CM | POA: Diagnosis not present

## 2014-03-12 MED ORDER — SODIUM PERTECHNETATE TC 99M INJECTION
10.0000 | Freq: Once | INTRAVENOUS | Status: AC | PRN
  Administered 2014-03-12: 10 via INTRAVENOUS

## 2014-03-12 MED ORDER — SODIUM IODIDE I 131 CAPSULE
15.3000 | Freq: Once | INTRAVENOUS | Status: AC | PRN
  Administered 2014-03-12: 15.3 via ORAL

## 2014-03-13 NOTE — Addendum Note (Signed)
Addended by: Carlus PavlovGHERGHE, Manal Kreutzer on: 03/13/2014 01:07 PM   Modules accepted: Level of Service

## 2014-03-14 ENCOUNTER — Ambulatory Visit (HOSPITAL_COMMUNITY): Payer: BC Managed Care – PPO

## 2014-03-15 ENCOUNTER — Other Ambulatory Visit (HOSPITAL_COMMUNITY): Payer: BC Managed Care – PPO

## 2014-03-17 ENCOUNTER — Telehealth: Payer: Self-pay | Admitting: Internal Medicine

## 2014-03-17 NOTE — Telephone Encounter (Signed)
Did we receive the results if so what is the outcome of the [place]

## 2014-03-17 NOTE — Telephone Encounter (Signed)
Patient is calling for the results of her radiation treatment results

## 2014-03-18 NOTE — Telephone Encounter (Signed)
Patient is asking for a return call today if possible.  Thank You

## 2014-03-19 NOTE — Telephone Encounter (Signed)
Pt called requesting results of Uptake and scan. Please advise.

## 2014-03-19 NOTE — Telephone Encounter (Signed)
Called pt and lvm on cell phone per result note below. Advised pt to call with any questions or concerns.

## 2014-03-19 NOTE — Telephone Encounter (Signed)
Entered by Carlus Pavlovristina Guadalupe Kerekes, MD at 03/13/2014 1:08 PM    Dear Ms Mayford KnifeWilliams,  The scan is consistent with Graves disease. Please continue the same dose of Methimazole for now and let's recheck the labs as discussed.  Sincerely,  Carlus Pavlovristina Stormie Ventola MD

## 2014-04-01 ENCOUNTER — Other Ambulatory Visit: Payer: Self-pay | Admitting: *Deleted

## 2014-04-01 MED ORDER — METHIMAZOLE 10 MG PO TABS: 10.0000 mg | ORAL_TABLET | Freq: Two times a day (BID) | ORAL | Status: DC

## 2014-04-03 ENCOUNTER — Other Ambulatory Visit: Payer: Self-pay | Admitting: Internal Medicine

## 2014-04-15 ENCOUNTER — Telehealth: Payer: Self-pay | Admitting: *Deleted

## 2014-04-15 NOTE — Telephone Encounter (Signed)
[facility] - Client TELEPHONE ADVICE RECORD [facility] Patient Name: Ana [name] Gender: Female DOB: 03/13/1981 Age: [age] Y 5 M 18 D Return Phone Number: [phone] (Primary), [phone] (Secondary) Address: City/State/Zip: [place] Client [facility] - Client Client Site  [facility] Physician Marga MelnickHopper, William Contact Type Call Call Type Triage / Clinical Relationship To Patient Self Return Phone Number [phone] (Primary) Chief Complaint Ears ringing Initial Comment Caller states she has ringing in her left ear. PreDisposition Did not know what to do Nurse Assessment Nurse: Debera Latalston, RN, Tinnie GensJeffrey Date/Time Lamount Cohen(Eastern Time): 04/10/2014 12:52:54 PM Confirm and document reason for call. If symptomatic, describe symptoms. ---Caller states she has pounding in left ear. Thyroid issues. B/P unknown. No dizziness. Headache. Has the patient traveled out of the country within the last 30 days? ---No Does the patient require triage? ---Yes Related visit to physician within the last 2 weeks? ---No Does the PT have any chronic conditions? (i.e. diabetes, asthma, etc.) ---Yes List chronic conditions. ---Hyperthyroid, diabetes type 1, hyperlipidemia. Did the patient indicate they were pregnant? ---No Guidelines Guideline Title Affirmed Question Affirmed Notes Nurse Date/Time (Eastern Time) High Blood Pressure BP 120-139 / 80-89 (all triage questions negative) Debera Latalston, RN, Tinnie GensJeffrey 04/10/2014 12:57:08 PM Disp. Time Lamount Cohen(Eastern Time) Disposition Final User 04/10/2014 1:02:08 PM See Physician within 4 Hours (or PCP triage) Yes Debera Latalston, RN, Tinnie GensJeffrey Disposition Overriden: Home Care Override Reason: Patient's symptoms need a higher level of care Caller Understands: Yes Disagree/Comply: Comply Care Advice Given Per Guideline PLEASE NOTE: All timestamps contained within this report are represented as [place] Standard  Time. CONFIDENTIALTY NOTICE: This fax transmission is intended only for the addressee. It contains information that is legally privileged, confidential or otherwise protected from use or disclosure. If you are not the intended recipient, you are strictly prohibited from reviewing, disclosing, copying using or disseminating any of this information or taking any action in reliance on or regarding this information. If you have received this fax in error, please notify us immediately by telephone so that we can arrange for its return to us. Phone: [phone], Toll-Free: [phone], Fax: [phone] Page: 2 of 2 Call Id: [id number] Care Advice Given Per Guideline SEE PHYSICIAN WITHIN 4 HOURS (or PCP triage): * IF NO PCP TRIAGE: You need to be seen. Go to _______________ (ED/UCC or office if it will be open) within the next 3 or 4 hours. Go sooner if you become worse. CALL BACK IF: * Weakness or numbness of the face, arm or leg on one side of the body occurs * Difficulty walking, difficulty talking, or severe headache occurs * Chest pain or difficulty breathing occurs * You become worse. CARE ADVICE given per High Blood Pressure (Adult) guideline. After Care Instructions Given Call Event Type User Date / Time Description Referrals [facility]

## 2014-05-20 ENCOUNTER — Encounter: Payer: Self-pay | Admitting: Internal Medicine

## 2014-05-20 ENCOUNTER — Ambulatory Visit (INDEPENDENT_AMBULATORY_CARE_PROVIDER_SITE_OTHER): Payer: BLUE CROSS/BLUE SHIELD | Admitting: Internal Medicine

## 2014-05-20 VITALS — BP 126/84 | HR 89 | Temp 98.2°F | Resp 16 | Ht 62.0 in | Wt 186.8 lb

## 2014-05-20 DIAGNOSIS — E119 Type 2 diabetes mellitus without complications: Secondary | ICD-10-CM

## 2014-05-20 DIAGNOSIS — R519 Headache, unspecified: Secondary | ICD-10-CM

## 2014-05-20 DIAGNOSIS — R51 Headache: Secondary | ICD-10-CM

## 2014-05-20 MED ORDER — TOPIRAMATE 25 MG PO CPSP: 25.0000 mg | ORAL_CAPSULE | Freq: Every day | ORAL | Status: DC

## 2014-05-20 NOTE — Patient Instructions (Signed)
We have sent in topamax to your pharmacy. For the first week take 1 pill at night time (25 mg). After that you can take 2 pills at night time (50 mg). In 1 month if you are still having a lot of problems with headaches we can adjust the dosing more.   Come back in about 6 months if you are doing well, if you are still having problems please call our office sooner.  Exercise to Stay Healthy Exercise helps you become and stay healthy. EXERCISE IDEAS AND TIPS Choose exercises that:  You enjoy.  Fit into your day. You do not need to exercise really hard to be healthy. You can do exercises at a slow or medium level and stay healthy. You can:  Stretch before and after working out.  Try yoga, Pilates, or tai chi.  Lift weights.  Walk fast, swim, jog, run, climb stairs, bicycle, dance, or rollerskate.  Take aerobic classes. Exercises that burn about 150 calories:  Running 1  miles in 15 minutes.  Playing volleyball for 45 to 60 minutes.  Washing and waxing a car for 45 to 60 minutes.  Playing touch football for 45 minutes.  Walking 1  miles in 35 minutes.  Pushing a stroller 1  miles in 30 minutes.  Playing basketball for 30 minutes.  Raking leaves for 30 minutes.  Bicycling 5 miles in 30 minutes.  Walking 2 miles in 30 minutes.  Dancing for 30 minutes.  Shoveling snow for 15 minutes.  Swimming laps for 20 minutes.  Walking up stairs for 15 minutes.  Bicycling 4 miles in 15 minutes.  Gardening for 30 to 45 minutes.  Jumping rope for 15 minutes.  Washing windows or floors for 45 to 60 minutes. Document Released: 05/07/2010 Document Revised: 06/27/2011 Document Reviewed: 05/07/2010 [facility] Patient Information 2015 [place], [place]. This information is not intended to replace advice given to you by your health care provider. Make sure you discuss any questions you have with your health care provider.

## 2014-05-20 NOTE — Progress Notes (Signed)
Pre visit review using our clinic review tool, if applicable. No additional management support is needed unless otherwise documented below in the visit note. 

## 2014-05-21 ENCOUNTER — Ambulatory Visit (INDEPENDENT_AMBULATORY_CARE_PROVIDER_SITE_OTHER): Payer: BLUE CROSS/BLUE SHIELD | Admitting: Internal Medicine

## 2014-05-21 ENCOUNTER — Encounter: Payer: Self-pay | Admitting: Internal Medicine

## 2014-05-21 VITALS — BP 112/68 | HR 99 | Temp 98.5°F | Resp 12 | Wt 186.0 lb

## 2014-05-21 DIAGNOSIS — E119 Type 2 diabetes mellitus without complications: Secondary | ICD-10-CM

## 2014-05-21 DIAGNOSIS — E559 Vitamin D deficiency, unspecified: Secondary | ICD-10-CM

## 2014-05-21 DIAGNOSIS — E0500 Thyrotoxicosis with diffuse goiter without thyrotoxic crisis or storm: Secondary | ICD-10-CM

## 2014-05-21 LAB — T3, FREE: T3, Free: 3.2 pg/mL (ref 2.3–4.2)

## 2014-05-21 LAB — TSH: TSH: 0.08 u[IU]/mL — ABNORMAL LOW (ref 0.35–4.50)

## 2014-05-21 LAB — VITAMIN D 25 HYDROXY (VIT D DEFICIENCY, FRACTURES): VITD: 15.59 ng/mL — ABNORMAL LOW (ref 30.00–100.00)

## 2014-05-21 LAB — HEMOGLOBIN A1C: HEMOGLOBIN A1C: 5.8 % (ref 4.6–6.5)

## 2014-05-21 LAB — T4, FREE: Free T4: 0.5 ng/dL — ABNORMAL LOW (ref 0.60–1.60)

## 2014-05-21 NOTE — Patient Instructions (Signed)
Please stop at the lab.  Please continue Methimazole 10 mg 2x a day for now.  Continue Toprol XL 25 mg daily for now. If your pulse at rest is still >90 after starting Topamax, increase to 2x a day.  Please return in 3 months with your sugar log.   Please stop at the lab.

## 2014-05-21 NOTE — Progress Notes (Signed)
Patient ID: Ana [name], female   DOB: 05/28/1980, 34 y.o.   MRN: [id number]  HPI: Ana [name] is a 34 y.o.-year-old female, returning for f/u DM2, dx summer 2014, non-insulin-dependent, uncontrolled, without complications. Last visit 2 mo ago.    DM2: Last hemoglobin A1c was: Lab Results  Component Value Date   HGBA1C 6.0 02/05/2014   HGBA1C 6.4 10/10/2013   HGBA1C 6.6* 07/11/2013  Previously 10.5% (10/2012).  Previously 6.5%.   Pt is on a regimen of: - Metformin 1000 mg po bid We had to stop Invokana 100 mg b/c repeated yeast inf. (09/26/2013) She was on JanuMet when she was prediabetic >> tolerated it well.   She has a One Touch Ultra meter - checking 3x a week after stopping Invokana: - am: 71-122 >> 71-121 >> 80-111 >> 90-128 >> 90-120s >> 91-121 - 2h after b'fast: 98-118 >> n/c - lunch: 97-123 >> 90s >> 90-121 >> 90-120 - 2h after lunch: 111-132 >> 128-148 (1h after lunch) >> n/c - dinner: 119-123 >> n/c >> 130-148 (snack before dinner) >> 90-120 - 2h after dinner: 120, 130 >> 95-114 >> n/c Highest 131 No lows. Lowest 90.  Pt's meals are: - Breakfast: bisquit - Lunch: fast food - Dinner:chicken, veg, starch - Snacks: rarely Drinks regular sodas 2x a day.   - + mild CKD, last BUN/creatinine:  Lab Results  Component Value Date   BUN 14 03/07/2013   CREATININE 1.1 03/07/2013  Last ACR: normal, 5.7 at last check, but had proteinuria after pregnancy 2010.  - Pt has HL. Last lipids: Lab Results  Component Value Date   CHOL 125 02/05/2014   HDL 38.00* 02/05/2014   LDLCALC 58 02/05/2014   LDLDIRECT 117.8 03/07/2013   TRIG 145.0 02/05/2014   CHOLHDL 3 02/05/2014  On Pravastatin 10 >> 20 in 02/2013. - Last eye exam - 02/2013.  - no numbness and tingling in her feet.  Thyrotoxicosis: - latest TFTs: Lab Results  Component Value Date   TSH 0.009* 03/06/2014   TSH 0.01* 01/31/2014   TSH 0.02* 12/18/2013   TSH 1.025 10/01/2008   FREET4 1.67  03/06/2014   FREET4 2.93* 01/31/2014   FREET4 1.97* 12/18/2013   ESR was high:  Lab Results  Component Value Date   ESRSEDRATE 26* 01/31/2014   I initially believed that she had subacute thyroiditis >> We started Toprol XL 25 mg at last visit, but the sxs persisted and her TFTs worsened >> we checked an uptake and scan that returned positive for Graves' disease.  We started MMI 10 mg bid and advised her to increase the beta blocker to 50 mg daily, but she did not increase it.   She c/o: - returning diarrhea - gained 10 lbs since last visit, previously weight loss (lost 40 lbs over 6 mo!!!) - no heat intolerance - no palpitations  She also has a history of PCOS, HL, HTN.   I reviewed pt's medications, allergies, PMH, social hx, family hx, and changes were documented in the history of present illness. Otherwise, unchanged from my initial visit note. She was advised to start Topamax for headaches, did not start yet.  ROS: Constitutional: see HPI Eyes: no blurry vision, no xerophthalmia ENT: no sore throat, no nodules palpated in throat, no dysphagia/odynophagia, no hoarseness Cardiovascular: no CP/SOB/palpitations/no leg swelling Respiratory: no cough/SOB Gastrointestinal: no N/V (had one episode)/+ D/no C Musculoskeletal: +muscle aches/no joint aches Skin: no rashes Neurological: no tremors/numbness/tingling/dizziness, +HA  PE: BP 112/68 mmHg  Pulse 99  Temp(Src) 98.5 F (36.9 C) (Oral)  Resp 12  Wt 186 lb (84.369 kg)  SpO2 98% Body mass index is 34.01 kg/(m^2).  Wt Readings from Last 3 Encounters:  05/20/14 186 lb 12.8 oz (84.732 kg)  02/18/14 176 lb (79.833 kg)  02/05/14 177 lb 12 oz (80.627 kg)   Constitutional: obese, in NAD Eyes: PERRLA, EOMI, no exophthalmos ENT: moist mucous membranes, + thyromegaly, no cervical lymphadenopathy Cardiovascular: tachycardia, RR, No MRG Respiratory: CTA B Gastrointestinal: abdomen soft, NT, ND, BS+ Musculoskeletal: no  deformities, strength intact in all 4 Skin: moist, warm, + acanthosis nigricans on neck Neurological: no tremor with outstretched hands, DTR normal in all 4  ASSESSMENT: 1. DM2, non-insulin-dependent, uncontrolled, without complications  2. Graves ds I was thinking - 03/12/2014: Thyroid uptake and scan: Consistent with Graves' disease: The 24 hr uptake by the thyroid gland is 65%. Normal 24 hr uptake is 10-30%. On thyroid imaging, the thyroid activity appears diffusely increased. No focal hot or cold nodules are identified.  - On methimazole  PLAN:  1. Patient with recent dx of diabetes, on oral regimen, with great DM control - I suggested to: continue Metformin 1000 mg 2x a day - check sugars daily, rotating checks - got the flu vaccine this season - will check Hba1c today   2. Graves ds - she was symptomatic at dx >> tachycardia, heat intolerance, tremors, and weight loss  - Symptoms much improved after starting methimazole, the only one that the back is diarrhea - Will check thyroid tests today: TSH, free T4, free T3 - We discussed about advantages and disadvantages of radioactive iodine treatment and the fact that we might need to go to this is the thyroid tests are not controlled on methimazole or if we cannot decrease the dose of methimazole further. She agrees to have this done if needed. I will let her know after the results of the tests are back. Until then, continue methimazole 10 mg twice a day. I advised her to stop the methimazole 4 days prior to the radioactive iodine treatment, if we need to do it. - Continue Toprol XL 25 mg but increase to twice a day if her pulse at rest is more than 90 bpm at home - Return to clinic in 3 months  Per her request will check a BMP. Per PCPs request, we'll check a vitamin D level.  Office Visit on 05/21/2014  Component Date Value Ref Range Status  . TSH 05/21/2014 0.08* 0.35 - 4.50 uIU/mL Final  . Free T4 05/21/2014 0.50* 0.60 - 1.60  ng/dL Final  . T3, Free 05/21/2014 3.2  2.3 - 4.2 pg/mL Final  . Hgb A1c MFr Bld 05/21/2014 5.8  4.6 - 6.5 % Final   Glycemic Control Guidelines for People with Diabetes:Non Diabetic:  <6%Goal of Therapy: <7%Additional Action Suggested:  >8%   . VITD 05/21/2014 15.59* 30.00 - 100.00 ng/mL Final   Vit D low >> will FWD to PCP. HbA1c improved. TSH still low, but fT4 also low >> will decrease MMI to 10 mg in am an 5 mg in pm. Will need repeat labs in 5-6 weeks.

## 2014-05-22 MED ORDER — METHIMAZOLE 10 MG PO TABS: ORAL_TABLET | ORAL | Status: DC

## 2014-05-22 NOTE — Assessment & Plan Note (Signed)
She will start taking topamax 25 mg nightly and can increase if needed in 1 week to 50 mg at night time. Max dosing for migraine ppx is 50 mg BID. She will call back if still having problems in 1 month.

## 2014-05-22 NOTE — Progress Notes (Signed)
   Subjective:    Patient ID: Ana [name] Care, female    DOB: 10/08/1980, 34 y.o.   MRN: [id number]  HPI The patient is a 34 YO female who wants to discuss her migraines. She has been struggling with migraines for some time and most of her teen-adult life. She has then about 2 times per week or more. They hurt 8/10 and have light sensitivity. She used to take topamax which she thinks helped but can't remember why she stopped taking it. She is currently taking a beta blocker for her thyroid but this does not seem to help prevent headaches. She is able to use ibuprofen for the headaches as needed with good relief.   Review of Systems  Constitutional: Positive for unexpected weight change. Negative for fever, activity change, appetite change and fatigue.       Weight gain   Eyes: Negative.   Respiratory: Negative for cough, chest tightness, shortness of breath and wheezing.   Cardiovascular: Negative for chest pain, palpitations and leg swelling.  Gastrointestinal: Negative.   Endocrine: Negative.   Musculoskeletal: Negative.   Skin: Negative.   Neurological: Positive for headaches. Negative for dizziness, weakness and light-headedness.  Psychiatric/Behavioral: Negative.       Objective:   Physical Exam  Constitutional: She is oriented to person, place, and time. She appears well-developed and well-nourished.  HENT:  Head: Normocephalic and atraumatic.  Eyes: EOM are normal. Pupils are equal, round, and reactive to light.  Neck: Normal range of motion.  Cardiovascular: Normal rate and regular rhythm.   Pulmonary/Chest: Effort normal and breath sounds normal.  Abdominal: Soft. Bowel sounds are normal. She exhibits no distension. There is no tenderness. There is no rebound.  Musculoskeletal: She exhibits no edema.  Neurological: She is alert and oriented to person, place, and time. Coordination normal.  Skin: Skin is warm and dry.  Psychiatric: She has a normal mood and affect. Her  behavior is normal.   Filed Vitals:   05/20/14 0837  BP: 126/84  Pulse: 89  Temp: 98.2 F (36.8 C)  TempSrc: Oral  Resp: 16  Height: 5\' 2"  (1.575 m)  Weight: 186 lb 12.8 oz (84.732 kg)  SpO2: 97%      Assessment & Plan:

## 2014-07-06 ENCOUNTER — Other Ambulatory Visit: Payer: Self-pay | Admitting: Internal Medicine

## 2014-08-04 ENCOUNTER — Other Ambulatory Visit: Payer: Self-pay | Admitting: Internal Medicine

## 2014-08-19 ENCOUNTER — Ambulatory Visit: Payer: BLUE CROSS/BLUE SHIELD | Admitting: Internal Medicine

## 2014-09-03 ENCOUNTER — Other Ambulatory Visit: Payer: Self-pay | Admitting: Internal Medicine

## 2014-09-18 ENCOUNTER — Encounter: Payer: Self-pay | Admitting: Internal Medicine

## 2014-09-18 ENCOUNTER — Ambulatory Visit (INDEPENDENT_AMBULATORY_CARE_PROVIDER_SITE_OTHER): Payer: BLUE CROSS/BLUE SHIELD | Admitting: Internal Medicine

## 2014-09-18 VITALS — BP 128/86 | HR 96 | Temp 98.5°F | Ht 62.0 in | Wt 197.0 lb

## 2014-09-18 DIAGNOSIS — E119 Type 2 diabetes mellitus without complications: Secondary | ICD-10-CM

## 2014-09-18 DIAGNOSIS — E0500 Thyrotoxicosis with diffuse goiter without thyrotoxic crisis or storm: Secondary | ICD-10-CM

## 2014-09-18 LAB — TSH: TSH: 38.85 u[IU]/mL — ABNORMAL HIGH (ref 0.35–4.50)

## 2014-09-18 LAB — BASIC METABOLIC PANEL
BUN: 16 mg/dL (ref 6–23)
CALCIUM: 9.5 mg/dL (ref 8.4–10.5)
CO2: 23 meq/L (ref 19–32)
Chloride: 104 mEq/L (ref 96–112)
Creatinine, Ser: 1.19 mg/dL (ref 0.40–1.20)
GFR: 66.42 mL/min (ref >60.00)
GLUCOSE: 79 mg/dL (ref 70–99)
Potassium: 3.8 mEq/L (ref 3.5–5.1)
Sodium: 135 mEq/L (ref 135–145)

## 2014-09-18 LAB — T3, FREE: T3, Free: 2.4 pg/mL (ref 2.3–4.2)

## 2014-09-18 LAB — T4, FREE: FREE T4: 0.16 ng/dL — AB (ref 0.60–1.60)

## 2014-09-18 LAB — HEMOGLOBIN A1C: HEMOGLOBIN A1C: 5.7 % (ref 4.6–6.5)

## 2014-09-18 MED ORDER — METFORMIN HCL ER 500 MG PO TB24: 1000.0000 mg | ORAL_TABLET | Freq: Two times a day (BID) | ORAL | Status: DC

## 2014-09-18 NOTE — Patient Instructions (Signed)
Please continue 1000 mg 2x a day of Metformin but switch to ER formulation.   Continue Methimazole 10 mg in am and 10 mg in pm for now.  Please stop at the lab.  Please come back for a follow-up appointment in 3 months. .Marland Kitchen

## 2014-09-18 NOTE — Progress Notes (Signed)
Patient ID: Ana [name], female   DOB: 18-Nov-1980, 34 y.o.   MRN: [id number]  HPI: Ana [name] is a 34 y.o.-year-old female, returning for f/u DM2, dx summer 2014, non-insulin-dependent, uncontrolled, without complications. Last visit 3 mo ago.    DM2: Last hemoglobin A1c was: Lab Results  Component Value Date   HGBA1C 5.8 05/21/2014   HGBA1C 6.0 02/05/2014   HGBA1C 6.4 10/10/2013  Previously 10.5% (10/2012).  Previously 6.5%.   Pt is on a regimen of: - Metformin 1000 mg po bid We had to stop Invokana 100 mg b/c repeated yeast inf. (09/26/2013) She was on JanuMet when she was prediabetic >> tolerated it well.   She has a One Touch Ultra meter. Checking CBGs 3x a week: - am: 71-122 >> 71-121 >> 80-111 >> 90-128 >> 90-120s >> 91-121 >> 121-128 - 2h after b'fast: 98-118 >> n/c - lunch: 97-123 >> 90s >> 90-121 >> 90-120 >> 90-120 - 2h after lunch: 111-132 >> 128-148 (1h after lunch) >> n/c - dinner: 119-123 >> n/c >> 130-148 (snack before dinner) >> 90-120 >> 90-120 - 2h after dinner: 120, 130 >> 95-114 >> n/c Highest 131 No lows. Lowest 90.  Pt's meals are: - Breakfast: bisquit - Lunch: fast food - Dinner:chicken, veg, starch - Snacks: rarely Drinks regular sodas 2x a day.   - + mild CKD, last BUN/creatinine:  Lab Results  Component Value Date   BUN 14 03/07/2013   CREATININE 1.1 03/07/2013  Last ACR: normal, 5.7 at last check, but had proteinuria after pregnancy 2010.  - Pt has HL. Last lipids: Lab Results  Component Value Date   CHOL 125 02/05/2014   HDL 38.00* 02/05/2014   LDLCALC 58 02/05/2014   LDLDIRECT 117.8 03/07/2013   TRIG 145.0 02/05/2014   CHOLHDL 3 02/05/2014  On Pravastatin 10 >> 20 in 02/2013. - Last eye exam - 02/2013.  - no numbness and tingling in her feet.  Thyrotoxicosis: - latest TFTs: Lab Results  Component Value Date   TSH 0.08* 05/21/2014   TSH 0.009* 03/06/2014   TSH 0.01* 01/31/2014   TSH 0.02* 12/18/2013   TSH 1.025   10/01/2008   FREET4 0.50* 05/21/2014   FREET4 1.67 03/06/2014   FREET4 2.93* 01/31/2014   FREET4 1.97* 12/18/2013   I initially believed that she had subacute thyroiditis >> We started Toprol XL 25 mg at last visit, but the sxs persisted and her TFTs worsened >> we checked an uptake and scan that returned positive for Graves' disease.  We started MMI 10 mg bid and advised her to decrease the MMI dose to 10 mg in am and 5 mg in pm 4 months ago. She did not do this >> continues 10 mg bid.   She c/o: - returning nausea and AP, diarrhea - gained 10 lbs since last visit, previously weight loss - no heat intolerance - no palpitations  She also has a history of PCOS, HL, HTN.   I reviewed pt's medications, allergies, PMH, social hx, family hx, and changes were documented in the history of present illness. Otherwise, unchanged from my initial visit note.   ROS: Constitutional: see HPI Eyes: no blurry vision, no xerophthalmia ENT: no sore throat, no nodules palpated in throat, no dysphagia/odynophagia, no hoarseness Cardiovascular: no CP/SOB/palpitations/no leg swelling Respiratory: no cough/SOB Gastrointestinal: + N/+ V/+ D/no C Musculoskeletal: + muscle aches/no joint aches Skin: no rashes Neurological: no tremors/numbness/tingling/dizziness, +HA  PE: BP 128/86 mmHg  Pulse 96  Temp(Src) 98.5  F (36.9 C) (Oral)  Ht 5\' 2"  (1.575 m)  Wt 197 lb (89.359 kg)  BMI 36.02 kg/m2  SpO2 97% Body mass index is 36.02 kg/(m^2).  Wt Readings from Last 3 Encounters:  09/18/14 197 lb (89.359 kg)  05/21/14 186 lb (84.369 kg)  05/20/14 186 lb 12.8 oz (84.732 kg)   Constitutional: obese, in NAD Eyes: PERRLA, EOMI, no exophthalmos ENT: moist mucous membranes, + thyromegaly, no cervical lymphadenopathy Cardiovascular: tachycardia, RR, No MRG Respiratory: CTA B Gastrointestinal: abdomen soft, NT, ND, BS+ Musculoskeletal: no deformities, strength intact in all 4 Skin: moist, warm, + acanthosis  nigricans on neck Neurological: no tremor with outstretched hands, DTR normal in all 4  ASSESSMENT: 1. DM2, non-insulin-dependent, uncontrolled, without complications  2. Graves ds I was thinking - 03/12/2014: Thyroid uptake and scan: Consistent with Graves' disease: The 24 hr uptake by the thyroid gland is 65%. Normal 24 hr uptake is 10-30%. On thyroid imaging, the thyroid activity appears diffusely increased. No focal hot or cold nodules are identified.  - On methimazole  PLAN:  1. Patient with recent dx of diabetes, on oral regimen, with great DM control - I suggested to: continue Metformin 1000 mg 2x a day, but will switch to XR b/c GI issues - check sugars daily, rotating checks - will check Hba1c today  - RTC in 3 mo  2. Graves ds - she was symptomatic at dx >> tachycardia, heat intolerance, tremors, and weight loss  - Symptoms much improved after starting methimazole, but did not receive the note about decreasing the dose... >> she has weight gain and unclear if from too much MMI - Will check thyroid tests today: TSH, free T4, free T3 - We discussed about advantages and disadvantages of radioactive iodine treatment and the fact that we might need to go to this is the thyroid tests are not controlled on methimazole or if we cannot decrease the dose of methimazole further. She agrees to have this done if needed. - Continue Toprol XL  - Return to clinic in 3 months  Office Visit on 09/18/2014  Component Date Value Ref Range Status  . Hgb A1c MFr Bld 09/18/2014 5.7  4.6 - 6.5 % Final   Glycemic Control Guidelines for People with Diabetes:Non Diabetic:  <6%Goal of Therapy: <7%Additional Action Suggested:  >8%   . T3, Free 09/18/2014 2.4  2.3 - 4.2 pg/mL Final  . Free T4 09/18/2014 0.16* 0.60 - 1.60 ng/dL Final  . TSH 16/10/960406/05/2014 38.85* 0.35 - 4.50 uIU/mL Final  . Sodium 09/18/2014 135  135 - 145 mEq/L Final  . Potassium 09/18/2014 3.8  3.5 - 5.1 mEq/L Final  . Chloride  09/18/2014 104  96 - 112 mEq/L Final  . CO2 09/18/2014 23  19 - 32 mEq/L Final  . Glucose, Bld 09/18/2014 79  70 - 99 mg/dL Final  . BUN 54/09/811906/05/2014 16  6 - 23 mg/dL Final  . Creatinine, Ser 09/18/2014 1.19  0.40 - 1.20 mg/dL Final  . Calcium 14/78/295606/05/2014 9.5  8.4 - 10.5 mg/dL Final  . GFR 21/30/865706/05/2014 66.42  >60.00 mL/min Final   HbA1c is great! BMP is normal. TFTs are terrible >> will stop Methimazole for now and let's repeat labs in 5 weeks. We may need to restart then at a lower dose. I will order.

## 2014-10-03 ENCOUNTER — Other Ambulatory Visit: Payer: Self-pay | Admitting: Internal Medicine

## 2014-11-11 ENCOUNTER — Other Ambulatory Visit (INDEPENDENT_AMBULATORY_CARE_PROVIDER_SITE_OTHER): Payer: BLUE CROSS/BLUE SHIELD

## 2014-11-11 ENCOUNTER — Encounter: Payer: Self-pay | Admitting: Internal Medicine

## 2014-11-11 DIAGNOSIS — E0500 Thyrotoxicosis with diffuse goiter without thyrotoxic crisis or storm: Secondary | ICD-10-CM

## 2014-11-11 LAB — T4, FREE: FREE T4: 1.28 ng/dL (ref 0.60–1.60)

## 2014-11-11 LAB — T3, FREE: T3 FREE: 5.1 pg/mL — AB (ref 2.3–4.2)

## 2014-11-11 LAB — TSH: TSH: 0.09 u[IU]/mL — ABNORMAL LOW (ref 0.35–4.50)

## 2014-11-18 ENCOUNTER — Ambulatory Visit (INDEPENDENT_AMBULATORY_CARE_PROVIDER_SITE_OTHER): Payer: BLUE CROSS/BLUE SHIELD | Admitting: Internal Medicine

## 2014-11-18 ENCOUNTER — Encounter: Payer: Self-pay | Admitting: Internal Medicine

## 2014-11-18 VITALS — BP 110/80 | HR 99 | Temp 98.6°F | Resp 16 | Ht 62.0 in | Wt 197.8 lb

## 2014-11-18 DIAGNOSIS — R519 Headache, unspecified: Secondary | ICD-10-CM

## 2014-11-18 DIAGNOSIS — E669 Obesity, unspecified: Secondary | ICD-10-CM

## 2014-11-18 DIAGNOSIS — R51 Headache: Secondary | ICD-10-CM

## 2014-11-18 MED ORDER — VITAMIN D (ERGOCALCIFEROL) 1.25 MG (50000 UNIT) PO CAPS: 50000.0000 [IU] | ORAL_CAPSULE | ORAL | Status: DC

## 2014-11-18 MED ORDER — LORCASERIN HCL 10 MG PO TABS
10.0000 mg | ORAL_TABLET | Freq: Two times a day (BID) | ORAL | Status: DC
Start: 2014-11-18 — End: 2015-02-18

## 2014-11-18 NOTE — Progress Notes (Signed)
Pre visit review using our clinic review tool, if applicable. No additional management support is needed unless otherwise documented below in the visit note. 

## 2014-11-18 NOTE — Progress Notes (Signed)
   Subjective:    Patient ID: Ana [name], female    DOB: [birth year], 34 y.o.   MRN: [id number]  HPI The patient is a 34 YO female who is coming in to follow up on her obesity and her headaches. She has been back on the topamax for headache prevention. It is helping some. Her weight has not been doing well. She would like to talk about medication to help her lose weight that will not bother her thyroid. She is trying to exercise a little but not much. Trying to change her food options but struggling with that. Has not been able to lose any weight since last visit.    Review of Systems  Constitutional: Positive for unexpected weight change. Negative for fever, activity change, appetite change and fatigue.       Weight gain   Eyes: Negative.   Respiratory: Negative for cough, chest tightness, shortness of breath and wheezing.   Cardiovascular: Negative for chest pain, palpitations and leg swelling.  Gastrointestinal: Negative.   Endocrine: Negative.   Musculoskeletal: Negative.   Skin: Negative.   Neurological: Positive for headaches. Negative for dizziness, weakness and light-headedness.  Psychiatric/Behavioral: Negative.       Objective:   Physical Exam  Constitutional: She is oriented to person, place, and time. She appears well-developed and well-nourished.  HENT:  Head: Normocephalic and atraumatic.  Eyes: EOM are normal. Pupils are equal, round, and reactive to light.  Neck: Normal range of motion.  Cardiovascular: Normal rate and regular rhythm.   Pulmonary/Chest: Effort normal and breath sounds normal.  Abdominal: Soft. Bowel sounds are normal. She exhibits no distension. There is no tenderness. There is no rebound.  Musculoskeletal: She exhibits no edema.  Neurological: She is alert and oriented to person, place, and time. Coordination normal.  Skin: Skin is warm and dry.  Psychiatric: She has a normal mood and affect. Her behavior is normal.   Filed Vitals:   11/18/14 0812  BP: 110/80  Pulse: 99  Temp: 98.6 F (37 C)  TempSrc: Oral  Resp: 16  Height:  (1.575 m)  Weight: 197 lb 12.8 oz (89.721 kg)  SpO2: 98%      Assessment & Plan:

## 2014-11-18 NOTE — Patient Instructions (Signed)
We have given you the prescription for the belviq which is the weight loss medicine. You take 1 pill twice a day and we see you back in about 3 months. If you are not losing any weight then we stop the medicine. If you are doing well with it we can keep you on it.   We have sent in the vitamin D that you take once a week for 3 months. When you are done stop the medicine and start taking vitamin D 5000 units daily.

## 2014-11-18 NOTE — Assessment & Plan Note (Signed)
Topamax is doing well for headache prevention and will continue.

## 2014-11-18 NOTE — Assessment & Plan Note (Signed)
Will try belviq for weight loss and see her back in 3 months to evaluate.

## 2014-12-10 ENCOUNTER — Other Ambulatory Visit: Payer: Self-pay | Admitting: Internal Medicine

## 2014-12-24 ENCOUNTER — Encounter: Payer: Self-pay | Admitting: Internal Medicine

## 2014-12-24 ENCOUNTER — Other Ambulatory Visit (INDEPENDENT_AMBULATORY_CARE_PROVIDER_SITE_OTHER): Payer: BLUE CROSS/BLUE SHIELD

## 2014-12-24 ENCOUNTER — Ambulatory Visit (INDEPENDENT_AMBULATORY_CARE_PROVIDER_SITE_OTHER): Payer: BLUE CROSS/BLUE SHIELD | Admitting: Internal Medicine

## 2014-12-24 ENCOUNTER — Other Ambulatory Visit (INDEPENDENT_AMBULATORY_CARE_PROVIDER_SITE_OTHER): Payer: BLUE CROSS/BLUE SHIELD | Admitting: *Deleted

## 2014-12-24 VITALS — BP 118/76 | HR 97 | Temp 98.4°F | Resp 12 | Wt 192.0 lb

## 2014-12-24 DIAGNOSIS — Z23 Encounter for immunization: Secondary | ICD-10-CM

## 2014-12-24 DIAGNOSIS — E0500 Thyrotoxicosis with diffuse goiter without thyrotoxic crisis or storm: Secondary | ICD-10-CM

## 2014-12-24 DIAGNOSIS — E119 Type 2 diabetes mellitus without complications: Secondary | ICD-10-CM

## 2014-12-24 LAB — T3, FREE: T3 FREE: 3.4 pg/mL (ref 2.3–4.2)

## 2014-12-24 LAB — TSH: TSH: 0.07 u[IU]/mL — ABNORMAL LOW (ref 0.35–4.50)

## 2014-12-24 LAB — T4, FREE: Free T4: 0.74 ng/dL (ref 0.60–1.60)

## 2014-12-24 LAB — HEMOGLOBIN A1C: Hgb A1c MFr Bld: 6.1 % (ref 4.6–6.5)

## 2014-12-24 MED ORDER — METHIMAZOLE 5 MG PO TABS: 5.0000 mg | ORAL_TABLET | Freq: Two times a day (BID) | ORAL | Status: DC

## 2014-12-24 NOTE — Progress Notes (Signed)
Patient ID: Ana [name], female   DOB: Feb 28, 1981, 34 y.o.   MRN: [id number]  HPI: Ana [name] is a 34 y.o.-year-old female, returning for f/u DM2, dx summer 2014, non-insulin-dependent, uncontrolled, without complications. Last visit 3 mo ago.    DM2: Last hemoglobin A1c was: Lab Results  Component Value Date   HGBA1C 5.7 09/18/2014   HGBA1C 5.8 05/21/2014   HGBA1C 6.0 02/05/2014  Previously 10.5% (10/2012).  Previously 6.5%.   Pt is on a regimen of: - Metformin XR 1000 mg po bid We had to stop Invokana 100 mg b/c repeated yeast inf. (09/26/2013) She was on JanuMet when she was prediabetic >> tolerated it well.   She has a One Touch Ultra meter. Checking CBGs 3x a week >> 90-124. Reviewed sugars from last visit - now ~ same. - am: 71-122 >> 71-121 >> 80-111 >> 90-128 >> 90-120s >> 91-121 >> 121-128 - 2h after b'fast: 98-118 >> n/c - lunch: 97-123 >> 90s >> 90-121 >> 90-120 >> 90-120 - 2h after lunch: 111-132 >> 128-148 (1h after lunch) >> n/c - dinner: 119-123 >> n/c >> 130-148 (snack before dinner) >> 90-120 >> 90-120 - 2h after dinner: 120, 130 >> 95-114 >> n/c Highest 124. No lows. Lowest 90s.  - + mild CKD, last BUN/creatinine:  Lab Results  Component Value Date   BUN 16 09/18/2014   CREATININE 1.19 09/18/2014  Last ACR: normal, 5.7 at last check, but had proteinuria after pregnancy 2010.  - Pt has HL. Last lipids: Lab Results  Component Value Date   CHOL 125 02/05/2014   HDL 38.00* 02/05/2014   LDLCALC 58 02/05/2014   LDLDIRECT 117.8 03/07/2013   TRIG 145.0 02/05/2014   CHOLHDL 3 02/05/2014  On Pravastatin 10 >> 20 in 02/2013. - Last eye exam - 02/2013.  - no numbness and tingling in her feet.  Thyrotoxicosis: - latest TFTs: Lab Results  Component Value Date   TSH 0.09* 11/11/2014   TSH 38.85* 09/18/2014   TSH 0.08* 05/21/2014   TSH 0.009* 03/06/2014   TSH 0.01* 01/31/2014   FREET4 1.28 11/11/2014   FREET4 0.16* 09/18/2014   FREET4 0.50*  05/21/2014   FREET4 1.67 03/06/2014   FREET4 2.93* 01/31/2014   I initially believed that she had subacute thyroiditis >> We started Toprol XL 25 mg at last visit, but the sxs persisted and her TFTs worsened >> we checked an uptake and scan that returned positive for Graves' disease.  We started MMI 10 mg bid and advised her to decrease the MMI dose to 10 mg in am and 5 mg in pm 4 months ago. She did not do this >> continued10 mg bid >> TSH 38 >> stopped MMI >> then TSH 0.09 >> restarted 5 mg MMI daily.  She c/o: - improved nausea, AP, diarrhea - lost 5 lbs since last visit - no fatigue - no heat intolerance - no palpitations - no anxiety - no tremors  She also has a history of PCOS, HL, HTN.   I reviewed pt's medications, allergies, PMH, social hx, family hx, and changes were documented in the history of present illness. Otherwise, unchanged from my initial visit note.   ROS: Constitutional: see HPI Eyes: no blurry vision, no xerophthalmia ENT: no sore throat, no nodules palpated in throat, no dysphagia/odynophagia, no hoarseness Cardiovascular: no CP/SOB/palpitations/no leg swelling Respiratory: no cough/SOB Gastrointestinal: no N/V/D/C Musculoskeletal: no muscle aches/no joint aches Skin: no rashes Neurological: no tremors/numbness/tingling/dizziness, no HA  PE: BP  118/76 mmHg  Pulse 97  Temp(Src) 98.4 F (36.9 C) (Oral)  Resp 12  Wt 192 lb (87.091 kg)  SpO2 97% Body mass index is 35.11 kg/(m^2).  Wt Readings from Last 3 Encounters:  12/24/14 192 lb (87.091 kg)  11/18/14 197 lb 12.8 oz (89.721 kg)  09/18/14 197 lb (89.359 kg)   Constitutional: obese, in NAD Eyes: PERRLA, EOMI, no exophthalmos ENT: moist mucous membranes, + thyromegaly, no cervical lymphadenopathy Cardiovascular: tachycardia, RR, No MRG Respiratory: CTA B Gastrointestinal: abdomen soft, NT, ND, BS+ Musculoskeletal: no deformities, strength intact in all 4 Skin: moist, warm, + acanthosis  nigricans on neck Neurological: no tremor with outstretched hands, DTR normal in all 4  ASSESSMENT: 1. DM2, non-insulin-dependent, uncontrolled, without complications  2. Graves ds I was thinking - 03/12/2014: Thyroid uptake and scan: Consistent with Graves' disease: The 24 hr uptake by the thyroid gland is 65%. Normal 24 hr uptake is 10-30%. On thyroid imaging, the thyroid activity appears diffusely increased. No focal hot or cold nodules are identified.  - On methimazole  PLAN:  1. Patient with fairly recent dx of diabetes, on oral regimen, with great DM control - I suggested to: continue Metformin XR 1000 mg 2x a day (we switched to XR at last visit b/c GI issues) - check sugars daily, rotating checks - will check Hba1c today  - RTC in 4 mo  2. Graves ds - she was symptomatic at dx >> tachycardia, heat intolerance, tremors, and weight loss >> now all resolved - before last visit, she did not receive the note about decreasing the dose... >> developed hypothyroidism from too much MMI >> we had to stop it, but then TSH became suppressed again >> we restarted 5 mg daily MMI >> she is now on this dose - Will check thyroid tests today: TSH, free T4, free T3 - We discussed about advantages and disadvantages of radioactive iodine treatment and the fact that we might need to go to this is the thyroid tests are not controlled on methimazole or if we cannot decrease the dose of methimazole further.  - Continue Toprol XL  - Return to clinic in 4 months  Appointment on 12/24/2014  Component Date Value Ref Range Status  . TSH 12/24/2014 0.07* 0.35 - 4.50 uIU/mL Final  . Free T4 12/24/2014 0.74  0.60 - 1.60 ng/dL Final  . T3, Free 09/81/1914 3.4  2.3 - 4.2 pg/mL Final  . Hgb A1c MFr Bld 12/24/2014 6.1  4.6 - 6.5 % Final   Glycemic Control Guidelines for People with Diabetes:Non Diabetic:  <6%Goal of Therapy: <7%Additional Action Suggested:  >8%    Msg sent: Dear Ms Mayford Knife, HbA1c is  still very good. The TSH is still too low >> let's increase the Methimazole to 5 mg 2x a day and repeat the thyroid tests in 6-8 weeks. Please call our main office number [phone]) to schedule a lab appointment.  Sincerely, Carlus Pavlov MD

## 2014-12-24 NOTE — Patient Instructions (Addendum)
Please stop at the lab.  Please continue Methimazole 5 mg daily. Continue Metformin XR 1000 mg 2x a day.  Please return in 4 months with your sugar log.   Please call and schedule an eye appt with Dr. Randon Goldsmith: [facility]:  Dr. Jeni Salles MD ?  Address: [address] [place], [place], [place] [address]  [phone]

## 2015-01-08 ENCOUNTER — Telehealth: Payer: Self-pay

## 2015-01-08 NOTE — Telephone Encounter (Signed)
PA started and Approved.

## 2015-01-13 ENCOUNTER — Other Ambulatory Visit: Payer: Self-pay | Admitting: Internal Medicine

## 2015-02-13 ENCOUNTER — Other Ambulatory Visit: Payer: Self-pay | Admitting: Internal Medicine

## 2015-02-18 ENCOUNTER — Encounter: Payer: Self-pay | Admitting: Internal Medicine

## 2015-02-18 ENCOUNTER — Ambulatory Visit (INDEPENDENT_AMBULATORY_CARE_PROVIDER_SITE_OTHER): Payer: BLUE CROSS/BLUE SHIELD | Admitting: Internal Medicine

## 2015-02-18 VITALS — BP 130/100 | HR 87 | Temp 98.3°F | Resp 14 | Ht 62.0 in | Wt 199.4 lb

## 2015-02-18 DIAGNOSIS — R030 Elevated blood-pressure reading, without diagnosis of hypertension: Secondary | ICD-10-CM

## 2015-02-18 DIAGNOSIS — E669 Obesity, unspecified: Secondary | ICD-10-CM | POA: Diagnosis not present

## 2015-02-18 MED ORDER — NALTREXONE-BUPROPION HCL ER 8-90 MG PO TB12: 2.0000 | ORAL_TABLET | Freq: Two times a day (BID) | ORAL | Status: DC

## 2015-02-18 NOTE — Patient Instructions (Addendum)
We have given you the instruction and savings card for contrave as well as the prescription.   Take 1 pill daily for 1 week, then 1 pill twice a day for week 2 then 2 pills with breakfast and 1 pill with dinner for week 3, then after 2 pills twice a day.  Come back about 3 months after starting contrave so we can see how you are doing.   Work on exercising as this is half the puzzle for weight loss.

## 2015-02-18 NOTE — Progress Notes (Signed)
Pre visit review using our clinic review tool, if applicable. No additional management support is needed unless otherwise documented below in the visit note. 

## 2015-02-18 NOTE — Assessment & Plan Note (Addendum)
Talked to her about the fact that her blood pressure readings have been high a couple of times and without weight loss she will be on blood pressure medicine. With her concurrent diabetes would add ACE-I.

## 2015-02-18 NOTE — Progress Notes (Signed)
   Subjective:    Patient ID: Ana [name], female    DOB: 08/10/1980, 34 y.o.   MRN: [id number]  HPI The patient is a 34 YO female coming in for follow up of her obesity. She was supposed to start belviq but she never did. It was too expensive with her insurance. She has not been exercising and has not changed her diet. Some change to her thyroid medication as it was overactive.   Review of Systems  Constitutional: Positive for unexpected weight change. Negative for fever, activity change, appetite change and fatigue.       Weight gain   Respiratory: Negative for cough, chest tightness, shortness of breath and wheezing.   Cardiovascular: Negative for chest pain, palpitations and leg swelling.  Gastrointestinal: Negative.   Genitourinary: Positive for frequency.  Musculoskeletal: Negative.   Skin: Negative.   Neurological: Negative for dizziness, weakness and light-headedness.  Psychiatric/Behavioral: Negative.       Objective:   Physical Exam  Constitutional: She is oriented to person, place, and time. She appears well-developed and well-nourished.  HENT:  Head: Normocephalic and atraumatic.  Eyes: EOM are normal. Pupils are equal, round, and reactive to light.  Neck: Normal range of motion.  Cardiovascular: Normal rate and regular rhythm.   Pulmonary/Chest: Effort normal and breath sounds normal.  Abdominal: Soft. Bowel sounds are normal. She exhibits no distension. There is no tenderness. There is no rebound.  Musculoskeletal: She exhibits no edema.  Neurological: She is alert and oriented to person, place, and time. Coordination normal.  Skin: Skin is warm and dry.  Psychiatric: She has a normal mood and affect. Her behavior is normal.   Filed Vitals:   02/18/15 0816  BP: 130/100  Pulse: 87  Temp: 98.3 F (36.8 C)  TempSrc: Oral  Resp: 14  Height: 5\' 2"  (1.575 m)  Weight: 199 lb 6.4 oz (90.447 kg)  SpO2: 98%      Assessment & Plan:

## 2015-02-18 NOTE — Assessment & Plan Note (Signed)
Given that she is unable to afford belviq will try contrave. She was given rx and savings card at today's visit. She is not a candidate for phentermine given her uncontrolled BP and thyroid condition. If not able to afford this could try adding GLP-1 for her diabetes for the weight loss.

## 2015-02-28 ENCOUNTER — Other Ambulatory Visit: Payer: Self-pay | Admitting: Internal Medicine

## 2015-03-06 ENCOUNTER — Telehealth: Payer: Self-pay

## 2015-03-06 NOTE — Telephone Encounter (Signed)
PA initiated via covermymeds. ZOX:[id number]:[id number]

## 2015-03-10 NOTE — Telephone Encounter (Signed)
Medication approved

## 2015-03-18 ENCOUNTER — Other Ambulatory Visit: Payer: Self-pay | Admitting: Internal Medicine

## 2015-04-18 ENCOUNTER — Other Ambulatory Visit: Payer: Self-pay | Admitting: Internal Medicine

## 2015-04-28 ENCOUNTER — Ambulatory Visit: Payer: BLUE CROSS/BLUE SHIELD | Admitting: Internal Medicine

## 2015-05-18 ENCOUNTER — Other Ambulatory Visit: Payer: Self-pay | Admitting: Internal Medicine

## 2015-05-20 ENCOUNTER — Ambulatory Visit (INDEPENDENT_AMBULATORY_CARE_PROVIDER_SITE_OTHER): Payer: BLUE CROSS/BLUE SHIELD | Admitting: Internal Medicine

## 2015-05-20 ENCOUNTER — Encounter: Payer: Self-pay | Admitting: Internal Medicine

## 2015-05-20 VITALS — BP 118/70 | HR 85 | Temp 98.4°F | Resp 14 | Ht 62.0 in | Wt 202.0 lb

## 2015-05-20 DIAGNOSIS — R030 Elevated blood-pressure reading, without diagnosis of hypertension: Secondary | ICD-10-CM

## 2015-05-20 DIAGNOSIS — E669 Obesity, unspecified: Secondary | ICD-10-CM

## 2015-05-20 MED ORDER — PHENTERMINE HCL 37.5 MG PO CAPS: 37.5000 mg | ORAL_CAPSULE | ORAL | Status: DC

## 2015-05-20 NOTE — Assessment & Plan Note (Signed)
Bp back to normal today.

## 2015-05-20 NOTE — Assessment & Plan Note (Signed)
Weight is up from last visit and okay with phentermine as BP is controlled. Advised of precautions and she will return in about 3 months for a visit.

## 2015-05-20 NOTE — Patient Instructions (Signed)
We have given you the prescription for phentermine today for the weight.   Come back in about 3 months to check in and if you have any problems sooner please call us and let us know.  Do not take phentermine if you are pregnant or planning to become pregnant.

## 2015-05-20 NOTE — Progress Notes (Signed)
   Subjective:    Patient ID: Ana [name], female    DOB: [birth year], 35 y.o.   MRN: [id number]  HPI The patient is a 35 YO female coming in for follow up on her weight. She had tried contrave since our last visit. She was not able to tolerate the medicine. She had severe nausea and headaches. She had to stop the medicine before she was able to lose any weight. She is depressed about her weight and has been thinking about bariatric surgery. Has been trying to work on diet and exercise.   Review of Systems  Constitutional: Positive for unexpected weight change. Negative for fever, activity change, appetite change and fatigue.       Weight gain   Respiratory: Negative for cough, chest tightness, shortness of breath and wheezing.   Cardiovascular: Negative for chest pain, palpitations and leg swelling.  Gastrointestinal: Negative.   Musculoskeletal: Negative.   Skin: Negative.   Neurological: Negative for dizziness, weakness and light-headedness.  Psychiatric/Behavioral: Negative.       Objective:   Physical Exam  Constitutional: She is oriented to person, place, and time. She appears well-developed and well-nourished.  HENT:  Head: Normocephalic and atraumatic.  Eyes: EOM are normal. Pupils are equal, round, and reactive to light.  Neck: Normal range of motion.  Cardiovascular: Normal rate and regular rhythm.   Pulmonary/Chest: Effort normal and breath sounds normal.  Abdominal: Soft. Bowel sounds are normal. She exhibits no distension. There is no tenderness. There is no rebound.  Musculoskeletal: She exhibits no edema.  Neurological: She is alert and oriented to person, place, and time. Coordination normal.  Skin: Skin is warm and dry.  Psychiatric: She has a normal mood and affect. Her behavior is normal.   Filed Vitals:   05/20/15 0818  BP: 118/70  Pulse: 85  Temp: 98.4 F (36.9 C)  TempSrc: Oral  Resp: 14  Height:  (1.575 m)  Weight: 202 lb (91.627 kg)  SpO2:  98%      Assessment & Plan:

## 2015-05-20 NOTE — Progress Notes (Signed)
Pre visit review using our clinic review tool, if applicable. No additional management support is needed unless otherwise documented below in the visit note. 

## 2015-06-02 ENCOUNTER — Other Ambulatory Visit: Payer: Self-pay | Admitting: Internal Medicine

## 2015-06-15 ENCOUNTER — Ambulatory Visit: Payer: BLUE CROSS/BLUE SHIELD | Admitting: Internal Medicine

## 2015-07-09 ENCOUNTER — Emergency Department (HOSPITAL_COMMUNITY)
Admission: EM | Admit: 2015-07-09 | Discharge: 2015-07-09 | Disposition: A | Discharge status: Home / Self Care | Payer: BLUE CROSS/BLUE SHIELD | Source: Home / Self Care | Attending: Family Medicine | Admitting: Family Medicine

## 2015-07-09 ENCOUNTER — Encounter (HOSPITAL_COMMUNITY): Payer: Self-pay | Admitting: Emergency Medicine

## 2015-07-09 DIAGNOSIS — L509 Urticaria, unspecified: Secondary | ICD-10-CM

## 2015-07-09 DIAGNOSIS — L259 Unspecified contact dermatitis, unspecified cause: Secondary | ICD-10-CM

## 2015-07-09 DIAGNOSIS — T7840XA Allergy, unspecified, initial encounter: Secondary | ICD-10-CM | POA: Diagnosis not present

## 2015-07-09 MED ORDER — PREDNISONE 20 MG PO TABS: ORAL_TABLET | ORAL | Status: DC

## 2015-07-09 MED ORDER — TRIAMCINOLONE ACETONIDE 40 MG/ML IJ SUSP
INTRAMUSCULAR | Status: AC
  Filled 2015-07-09: qty 1

## 2015-07-09 MED ORDER — TRIAMCINOLONE ACETONIDE 40 MG/ML IJ SUSP
40.0000 mg | Freq: Once | INTRAMUSCULAR | Status: AC
  Administered 2015-07-09: 40 mg via INTRAMUSCULAR

## 2015-07-09 NOTE — ED Notes (Signed)
C/o rash all over body onset today... Denies fevers, chills Hx of eczema A&O x4... No acute distress.

## 2015-07-09 NOTE — ED Provider Notes (Signed)
CSN: [id number]     Arrival date & time 07/09/15  1942 History   First MD Initiated Contact with Patient 07/09/15 2035     Chief Complaint  Patient presents with  . Rash   (Consider location/radiation/quality/duration/timing/severity/associated sxs/prior Treatment) HPI Comments: Patient developed a red itchy rash this morning. It has since spread relatively rapidly to various parts of her body today is primarily located to her face neck and shoulders. There are few areas to the abdomen and thighs. Denies problems with breathing, swelling, coughing. She states she may have some new body wash or other body chemical recently.   Past Medical History  Diagnosis Date  . Hypertension   . PCOS (polycystic ovarian syndrome)   . DM2 (diabetes mellitus, type 2) (HCC) 01/2013 dx  . Other and unspecified hyperlipidemia    Past Surgical History  Procedure Laterality Date  . Cesarean section  2010   Family History  Problem Relation Age of Onset  . Diabetes Mother     Type II  . Thyroid disease Mother   . Hypertension Mother   . Hyperlipidemia Mother   . Diabetes Father     TYPE II  . Hypertension Father   . Hyperlipidemia Father   . Heart disease Father     CAD with stent  . Hyperlipidemia Maternal Grandmother   . Hypertension Maternal Grandmother   . Obesity Maternal Grandmother   . Diabetes Maternal Grandmother   . Cancer Paternal Grandmother 26    colon cancer  . Diabetes Paternal Grandmother    Social History  Substance Use Topics  . Smoking status: Never Smoker   . Smokeless tobacco: None  . Alcohol Use: No   OB History    No data available     Review of Systems  Constitutional: Negative.   Respiratory: Negative.  Negative for cough, choking and shortness of breath.   Cardiovascular: Negative.   Musculoskeletal: Negative.   Skin: Positive for rash.  Neurological: Negative.   All other systems reviewed and are negative.   Allergies  Invokana  Home Medications    Prior to Admission medications   Medication Sig Start Date End Date Taking? Authorizing Provider  metFORMIN (GLUCOPHAGE-XR) 500 MG 24 hr tablet TAKE 2 TABLETS (1,000 MG TOTAL) BY MOUTH 2 (TWO) TIMES DAILY WITH A MEAL. 05/18/15  Yes Carlus Pavlov, MD  methimazole (TAPAZOLE) 5 MG tablet TAKE 1 TABLET BY MOUTH TWICE DAILY. **PT NEEDS LAB APPT FOR FUTURE REFILLS** 06/02/15  Yes Carlus Pavlov, MD  metoprolol succinate (TOPROL-XL) 25 MG 24 hr tablet TAKE 2 TABLETS (50 MG TOTAL) BY MOUTH DAILY. 12/11/14  Yes Carlus Pavlov, MD  pravastatin (PRAVACHOL) 20 MG tablet TAKE 1 TABLET BY MOUTH AT BEDTIME 05/18/15  Yes Carlus Pavlov, MD  topiramate (TOPAMAX) 25 MG tablet TAKE 1 TO 2 TABLETS  (25-50 MG TOTAL) BY MOUTH DAILY. 08/04/14  Yes Myrlene Broker, MD  Vitamin D, Ergocalciferol, (DRISDOL) 50000 UNITS CAPS capsule Take 1 capsule (50,000 Units total) by mouth every 7 (seven) days. 11/18/14  Yes Myrlene Broker, MD  glucose blood (ONE TOUCH ULTRA TEST) test strip  09/26/13   Historical Provider, MD  glucose blood test strip Use 2 times day as instructed - One Touch Ultra 09/26/13   Carlus Pavlov, MD  levonorgestrel (MIRENA) 20 MCG/24HR IUD 1 Intra Uterine Device (1 each total) by Intrauterine route once. Placed 07/19/13 - gyn 08/02/13   Newt Lukes, MD  metoprolol succinate (TOPROL-XL) 25 MG 24 hr tablet TAKE  2 TABLETS (50 MG TOTAL) BY MOUTH DAILY. Patient not taking: Reported on 05/20/2015 05/18/15   Carlus Pavlovristina Gherghe, MD  phentermine 37.5 MG capsule Take 1 capsule (37.5 mg total) by mouth every morning. 05/20/15   Myrlene BrokerElizabeth A Crawford, MD  predniSONE (DELTASONE) 20 MG tablet Take 3 tabs po on first day, 3 tabs second day, 2 tabs third day, 2 tab fourth day, 1 tab 5th and 6th days. Take with food. 07/09/15   Hayden Rasmussenavid Prather Failla, NP   Meds Ordered and Administered this Visit   Medications  triamcinolone acetonide (KENALOG-40) injection 40 mg (not administered)    BP 148/108 mmHg  Pulse 92   Temp(Src) 97.9 F (36.6 C) (Oral)  Resp 18  SpO2 100% No data found.   Physical Exam  Constitutional: She is oriented to person, place, and time. She appears well-developed and well-nourished. No distress.  HENT:  Oropharynx is clear. No swelling. No erythema. Airway widely patent.  Eyes: Conjunctivae and EOM are normal.  Neck: Normal range of motion. Neck supple.  Cardiovascular: Normal rate.   Pulmonary/Chest: Effort normal and breath sounds normal. No respiratory distress. She has no wheezes. She has no rales.  Musculoskeletal: She exhibits no edema.  Neurological: She is alert and oriented to person, place, and time. She exhibits normal muscle tone.  Skin: Skin is warm and dry.  Red, maculopapular and papulovesicular type rash in patches and streaks to the posterior and lateral aspects of the neck, top of shoulders and a few isolated patches to the arms, the lower abdomen, buttocks and thighs.  Psychiatric: She has a normal mood and affect.  Nursing note and vitals reviewed.   ED Course  Procedures (including critical care time)  Labs Review Labs Reviewed - No data to display  Imaging Review No results found.   Visual Acuity Review  Right Eye Distance:   Left Eye Distance:   Bilateral Distance:    Right Eye Near:   Left Eye Near:    Bilateral Near:         MDM   1. Urticaria   2. Allergic reaction, initial encounter   3. Contact dermatitis    Meds ordered this encounter  Medications  . triamcinolone acetonide (KENALOG-40) injection 40 mg    Sig:   . predniSONE (DELTASONE) 20 MG tablet    Sig: Take 3 tabs po on first day, 3 tabs second day, 2 tabs third day, 2 tab fourth day, 1 tab 5th and 6th days. Take with food.    Dispense:  12 tablet    Refill:  0    Order Specific Question:  Supervising Provider    Answer:  Linna HoffKINDL, JAMES D [[id number]]   USe allegra 180 mg q d Avoid new products    Hayden RasmussenDavid Kwaku Mostafa, NP 07/09/15 2059

## 2015-07-09 NOTE — Discharge Instructions (Signed)
Allergies °An allergy is an abnormal reaction to a substance by the body's defense system (immune system). Allergies can develop at any age. °WHAT CAUSES ALLERGIES? °An allergic reaction happens when the immune system mistakenly reacts to a normally harmless substance, called an allergen, as if it were harmful. The immune system releases antibodies to fight the substance. Antibodies eventually release a chemical called histamine into the bloodstream. The release of histamine is meant to protect the body from infection, but it also causes discomfort. °An allergic reaction can be triggered by: °· Eating an allergen. °· Inhaling an allergen. °· Touching an allergen. °WHAT TYPES OF ALLERGIES ARE THERE? °There are many types of allergies. Common types include: °· Seasonal allergies. People with this type of allergy are usually allergic to substances that are only present during certain seasons, such as molds and pollens. °· Food allergies. °· Drug allergies. °· Insect allergies. °· Animal dander allergies. °WHAT ARE SYMPTOMS OF ALLERGIES? °Possible allergy symptoms include: °· Swelling of the lips, face, tongue, mouth, or throat. °· Sneezing, coughing, or wheezing. °· Nasal congestion. °· Tingling in the mouth. °· Rash. °· Itching. °· Itchy, red, swollen areas of skin (hives). °· Watery eyes. °· Vomiting. °· Diarrhea. °· Dizziness. °· Lightheadedness. °· Fainting. °· Trouble breathing or swallowing. °· Chest tightness. °· Rapid heartbeat. °HOW ARE ALLERGIES DIAGNOSED? °Allergies are diagnosed with a medical and family history and one or more of the following: °· Skin tests. °· Blood tests. °· A food diary. A food diary is a record of all the foods and drinks you have in a day and of all the symptoms you experience. °· The results of an elimination diet. An elimination diet involves eliminating foods from your diet and then adding them back in one by one to find out if a certain food causes an allergic reaction. °HOW ARE  ALLERGIES TREATED? °There is no cure for allergies, but allergic reactions can be treated with medicine. Severe reactions usually need to be treated at a hospital. °HOW CAN REACTIONS BE PREVENTED? °The best way to prevent an allergic reaction is by avoiding the substance you are allergic to. Allergy shots and medicines can also help prevent reactions in some cases. People with severe allergic reactions may be able to prevent a life-threatening reaction called anaphylaxis with a medicine given right after exposure to the allergen. °  °This information is not intended to replace advice given to you by your health care provider. Make sure you discuss any questions you have with your health care provider. °  °Document Released: 06/28/2002 Document Revised: 04/25/2014 Document Reviewed: 01/14/2014 °Elsevier Interactive Patient Education ©2016 [employer] ° °Hives °Hives are itchy, red, swollen areas of the skin. They can vary in size and location on your body. Hives can come and go for hours or several days (acute hives) or for several weeks (chronic hives). Hives do not spread from person to person (noncontagious). They may get worse with scratching, exercise, and emotional stress. °CAUSES  °· Allergic reaction to food, additives, or drugs. °· Infections, including the common cold. °· Illness, such as vasculitis, lupus, or thyroid disease. °· Exposure to sunlight, heat, or cold. °· Exercise. °· Stress. °· Contact with chemicals. °SYMPTOMS  °· Red or white swollen patches on the skin. The patches may change size, shape, and location quickly and repeatedly. °· Itching. °· Swelling of the hands, feet, and face. This may occur if hives develop deeper in the skin. °DIAGNOSIS  °Your caregiver can usually tell   what is wrong by performing a physical exam. Skin or blood tests may also be done to determine the cause of your hives. In some cases, the cause cannot be determined. °TREATMENT  °Mild cases usually get better with  medicines such as antihistamines. Severe cases may require an emergency epinephrine injection. If the cause of your hives is known, treatment includes avoiding that trigger.  °HOME CARE INSTRUCTIONS  °· Avoid causes that trigger your hives. °· Take antihistamines as directed by your caregiver to reduce the severity of your hives. Non-sedating or low-sedating antihistamines are usually recommended. Do not drive while taking an antihistamine. °· Take any other medicines prescribed for itching as directed by your caregiver. °· Wear loose-fitting clothing. °· Keep all follow-up appointments as directed by your caregiver. °SEEK MEDICAL CARE IF:  °· You have persistent or severe itching that is not relieved with medicine. °· You have painful or swollen joints. °SEEK IMMEDIATE MEDICAL CARE IF:  °· You have a fever. °· Your tongue or lips are swollen. °· You have trouble breathing or swallowing. °· You feel tightness in the throat or chest. °· You have abdominal pain. °These problems may be the first sign of a life-threatening allergic reaction. Call your local emergency services (911 in [place] °MAKE SURE YOU:  °· Understand these instructions. °· Will watch your condition. °· Will get help right away if you are not doing well or get worse. °  °This information is not intended to replace advice given to you by your health care provider. Make sure you discuss any questions you have with your health care provider. °  °Document Released: 04/04/2005 Document Revised: 04/09/2013 Document Reviewed: 06/28/2011 °Elsevier Interactive Patient Education ©2016 [employer] ° °

## 2015-07-10 ENCOUNTER — Ambulatory Visit (INDEPENDENT_AMBULATORY_CARE_PROVIDER_SITE_OTHER): Payer: BLUE CROSS/BLUE SHIELD | Admitting: Internal Medicine

## 2015-07-10 ENCOUNTER — Other Ambulatory Visit (INDEPENDENT_AMBULATORY_CARE_PROVIDER_SITE_OTHER): Payer: BLUE CROSS/BLUE SHIELD | Admitting: *Deleted

## 2015-07-10 ENCOUNTER — Encounter: Payer: Self-pay | Admitting: Internal Medicine

## 2015-07-10 VITALS — BP 118/70 | HR 83 | Temp 98.4°F | Resp 12 | Wt 197.0 lb

## 2015-07-10 DIAGNOSIS — E0500 Thyrotoxicosis with diffuse goiter without thyrotoxic crisis or storm: Secondary | ICD-10-CM | POA: Diagnosis not present

## 2015-07-10 DIAGNOSIS — E119 Type 2 diabetes mellitus without complications: Secondary | ICD-10-CM | POA: Diagnosis not present

## 2015-07-10 LAB — COMPLETE METABOLIC PANEL WITH GFR
ALT: 10 U/L (ref 6–29)
AST: 13 U/L (ref 10–30)
Albumin: 4.2 g/dL (ref 3.6–5.1)
Alkaline Phosphatase: 61 U/L (ref 33–115)
BUN: 17 mg/dL (ref 7–25)
CHLORIDE: 105 mmol/L (ref 98–110)
CO2: 23 mmol/L (ref 20–31)
Calcium: 9.5 mg/dL (ref 8.6–10.2)
Creat: 1.06 mg/dL (ref 0.50–1.10)
GFR, Est African American: 79 mL/min (ref >=60)
GFR, Est Non African American: 68 mL/min (ref >=60)
GLUCOSE: 81 mg/dL (ref 65–99)
POTASSIUM: 4.5 mmol/L (ref 3.5–5.3)
Sodium: 138 mmol/L (ref 135–146)
TOTAL PROTEIN: 8 g/dL (ref 6.1–8.1)
Total Bilirubin: 0.4 mg/dL (ref 0.2–1.2)

## 2015-07-10 LAB — MICROALBUMIN / CREATININE URINE RATIO
Creatinine,U: 215.9 mg/dL
MICROALB UR: 8.7 mg/dL — AB (ref 0.0–1.9)
Microalb Creat Ratio: 4 mg/g (ref 0.0–30.0)

## 2015-07-10 LAB — T3, FREE: T3, Free: 3.3 pg/mL (ref 2.3–4.2)

## 2015-07-10 LAB — TSH: TSH: 5.99 u[IU]/mL — AB (ref 0.35–4.50)

## 2015-07-10 LAB — LIPID PANEL
CHOLESTEROL: 187 mg/dL (ref 0–200)
HDL: 40.8 mg/dL (ref >39.00)
LDL Cholesterol: 121 mg/dL — ABNORMAL HIGH (ref 0–99)
NonHDL: 146.11
TRIGLYCERIDES: 126 mg/dL (ref 0.0–149.0)
Total CHOL/HDL Ratio: 5
VLDL: 25.2 mg/dL (ref 0.0–40.0)

## 2015-07-10 LAB — T4, FREE: Free T4: 0.62 ng/dL (ref 0.60–1.60)

## 2015-07-10 LAB — POCT GLYCOSYLATED HEMOGLOBIN (HGB A1C): HEMOGLOBIN A1C: 5.7

## 2015-07-10 NOTE — Patient Instructions (Signed)
Please stop at the lab.  Please continue Methimazole 5 mg daily.  Continue Metformin XR 1000 mg 2x a day.  Please return in 3 months with your sugar log.   Please call and schedule an eye appt with Dr. Randon GoldsmithLyles: [facility]:  Dr. Jeni SallesLyles Graham W. MD ?  Address: [address], [place], [place] [address]  [phone]

## 2015-07-10 NOTE — Progress Notes (Signed)
Patient ID: Ana [name], female   DOB: 1980/08/19, 35 y.o.   MRN: [id number]  HPI: Ana [name] is a 35 y.o.-year-old female, returning for f/u DM2, dx summer 2014, non-insulin-dependent, uncontrolled, without complications. Last visit 6 mo ago.   She started Phentermine 3 weeks ago >> 8 lbs lost!   DM2: Last hemoglobin A1c was: Lab Results  Component Value Date   HGBA1C 6.1 12/24/2014   HGBA1C 5.7 09/18/2014   HGBA1C 5.8 05/21/2014  Previously 10.5% (10/2012).  Previously 6.5%.   Pt is on a regimen of: - Metformin XR 1000 mg po bid We had to stop Invokana 100 mg b/c repeated yeast inf. (09/26/2013) She was on JanuMet when she was prediabetic >> tolerated it well.   She has a One Touch Ultra meter.  Checking CBGs 3x a week: - am: 71-122 >> 71-121 >> 80-111 >> 90-128 >> 90-120s >> 91-121 >> 121-128 >> 90s - 2h after b'fast: 98-118 >> n/c - lunch: 97-123 >> 90s >> 90-121 >> 90-120 >> 90-120 - 2h after lunch: 111-132 >> 128-148 (1h after lunch) >> n/c - dinner: 119-123 >> n/c >> 130-148 (snack before dinner) >> 90-120 >> 90-120 >> 100-120, 130 - 2h after dinner: 120, 130 >> 95-114 >> n/c Highest 124 >> 130 No lows. Lowest 90s.  - + mild CKD, last BUN/creatinine:  Lab Results  Component Value Date   BUN 16 09/18/2014   CREATININE 1.19 09/18/2014  Last ACR: normal, 5.7 at last check, but had proteinuria after pregnancy 2010.  - Pt has HL. Last lipids: Lab Results  Component Value Date   CHOL 125 02/05/2014   HDL 38.00* 02/05/2014   LDLCALC 58 02/05/2014   LDLDIRECT 117.8 03/07/2013   TRIG 145.0 02/05/2014   CHOLHDL 3 02/05/2014  On Pravastatin 10 >> 20 in 02/2013. - Last eye exam - 02/2013.  - no numbness and tingling in her feet.  Graves ds.: - latest TFTs: Lab Results  Component Value Date   TSH 0.07* 12/24/2014   TSH 0.09* 11/11/2014   TSH 38.85* 09/18/2014   TSH 0.08* 05/21/2014   TSH 0.009* 03/06/2014   FREET4 0.74 12/24/2014   FREET4 1.28  11/11/2014   FREET4 0.16* 09/18/2014   FREET4 0.50* 05/21/2014   FREET4 1.67 03/06/2014   I initially believed that she had subacute thyroiditis >> We started Toprol XL 25 mg at last visit, but the sxs persisted and her TFTs worsened >> we checked an uptake and scan that returned positive for Graves' disease.  We started MMI 10 mg bid and advised her to decrease the MMI dose to 10 mg in am and 5 mg in pm 4 months ago. She did not do this >> continued10 mg bid >> TSH 38 >> stopped MMI >> then TSH 0.09 >> restarted 5 mg MMI daily, then increased to 2x a day 6 mo ago >> again, did not come back for labs...  She c/o: - no nausea, AP, diarrhea - lost 8 lbs since last visit - intentional, on Phentermine - + fatigue - + heat and cold intolerance  - no anxiety - no tremors  She also has a history of PCOS, HL, HTN.   I reviewed pt's medications, allergies, PMH, social hx, family hx, and changes were documented in the history of present illness. Otherwise, unchanged from my initial visit note.   ROS: Constitutional: see HPI Eyes: no blurry vision, no xerophthalmia ENT: no sore throat, no nodules palpated in throat, no dysphagia/odynophagia,  no hoarseness Cardiovascular: no CP/SOB/+ palpitations/no leg swelling Respiratory: no cough/SOB Gastrointestinal: no N/V/D/C Musculoskeletal: no muscle aches/no joint aches Skin: + rash - got a Kenalog inj yesterday at Urgent Care and will start Prednisone taper Neurological: no tremors/numbness/tingling/dizziness, + HA  PE: BP 118/70 mmHg  Pulse 83  Temp(Src) 98.4 F (36.9 C) (Oral)  Resp 12  Wt 197 lb (89.359 kg)  SpO2 98% Body mass index is 36.02 kg/(m^2).  Wt Readings from Last 3 Encounters:  07/10/15 197 lb (89.359 kg)  05/20/15 202 lb (91.627 kg)  02/18/15 199 lb 6.4 oz (90.447 kg)   Constitutional: obese, in NAD Eyes: PERRLA, EOMI, no exophthalmos ENT: moist mucous membranes, + thyromegaly, no cervical lymphadenopathy Cardiovascular:  tachycardia, RR, No MRG Respiratory: CTA B Gastrointestinal: abdomen soft, NT, ND, BS+ Musculoskeletal: no deformities, strength intact in all 4 Skin: moist, warm, + acanthosis nigricans on neck Neurological: no tremor with outstretched hands, DTR normal in all 4  ASSESSMENT: 1. DM2, non-insulin-dependent, uncontrolled, without complications  2. Graves ds - 03/12/2014: Thyroid uptake and scan: Consistent with Graves' disease: The 24 hr uptake by the thyroid gland is 65%. Normal 24 hr uptake is 10-30%. On thyroid imaging, the thyroid activity appears diffusely increased. No focal hot or cold nodules are identified.  - On methimazole  PLAN:  1. Patient with fairly recent dx of diabetes, on oral regimen, with great DM control - I suggested to: continue Metformin XR 1000 mg 2x a day (we switched to XR b/c GI issues) - check sugars 3x a week, rotating checks - will check Hba1c today >> 5.7% (better) - will check Lipids, CMP, ACR today - again advised to schedule an appt with the ophthalmologist - RTC in 3 mo  2. Graves ds - she was symptomatic at dx >> tachycardia, heat intolerance, tremors, and weight loss >> now all resolved. She has palpitations occasionally (on Phentermine) - on 5 mg bid daily MMI, but again, she has stayed on this for too long >> will check labs today: TSH, free T4, free T3 - I expect we need to decrease the dose - Continue Toprol XL  - Return to clinic in 3 months  Patient Instructions  Please stop at the lab.  Please continue Methimazole 5 mg daily.  Continue Metformin XR 1000 mg 2x a day.  Please return in 3 months with your sugar log.   Please call and schedule an eye appt with Dr. Prudencio Burly: [facility]:  Dr. Sherlyn Lick MD ?  Address: [address], [place], [place] [address]  [phone]  Needs refills MMI.  Orders Only on 07/10/2015  Component Date Value Ref Range Status  . Hemoglobin A1C 07/10/2015 5.7   Final   Office Visit on 07/10/2015  Component Date Value Ref Range Status  . Free T4 07/10/2015 0.62  0.60 - 1.60 ng/dL Final  . T3, Free 07/10/2015 3.3  2.3 - 4.2 pg/mL Final  . TSH 07/10/2015 5.99* 0.35 - 4.50 uIU/mL Final  . Cholesterol 07/10/2015 187  0 - 200 mg/dL Final   ATP III Classification       Desirable:  < 200 mg/dL               Borderline High:  200 - 239 mg/dL          High:  > = 240 mg/dL  . Triglycerides 07/10/2015 126.0  0.0 - 149.0 mg/dL Final   Normal:  <150 mg/dLBorderline High:  150 - 199  mg/dL  . HDL 07/10/2015 40.80  >39.00 mg/dL Final  . VLDL 07/10/2015 25.2  0.0 - 40.0 mg/dL Final  . LDL Cholesterol 07/10/2015 121* 0 - 99 mg/dL Final  . Total CHOL/HDL Ratio 07/10/2015 5   Final                  Men          Women1/2 Average Risk     3.4          3.3Average Risk          5.0          4.42X Average Risk          9.6          7.13X Average Risk          15.0          11.0                      . NonHDL 07/10/2015 146.11   Final   NOTE:  Non-HDL goal should be 30 mg/dL higher than patient's LDL goal (i.e. LDL goal of < 70 mg/dL, would have non-HDL goal of < 100 mg/dL)  . Sodium 07/10/2015 138  135 - 146 mmol/L Final  . Potassium 07/10/2015 4.5  3.5 - 5.3 mmol/L Final  . Chloride 07/10/2015 105  98 - 110 mmol/L Final  . CO2 07/10/2015 23  20 - 31 mmol/L Final  . Glucose, Bld 07/10/2015 81  65 - 99 mg/dL Final  . BUN 07/10/2015 17  7 - 25 mg/dL Final  . Creat 07/10/2015 1.06  0.50 - 1.10 mg/dL Final  . Total Bilirubin 07/10/2015 0.4  0.2 - 1.2 mg/dL Final  . Alkaline Phosphatase 07/10/2015 61  33 - 115 U/L Final  . AST 07/10/2015 13  10 - 30 U/L Final  . ALT 07/10/2015 10  6 - 29 U/L Final  . Total Protein 07/10/2015 8.0  6.1 - 8.1 g/dL Final  . Albumin 07/10/2015 4.2  3.6 - 5.1 g/dL Final  . Calcium 07/10/2015 9.5  8.6 - 10.2 mg/dL Final  . GFR, Est African American 07/10/2015 79  >=60 mL/min Final  . GFR, Est Non African American 07/10/2015 68  >=60 mL/min Final    Comment:   The estimated GFR is a calculation valid for adults (>=24 years old) that uses the CKD-EPI algorithm to adjust for age and sex. It is   not to be used for children, pregnant women, hospitalized patients,    patients on dialysis, or with rapidly changing kidney function. According to the NKDEP, eGFR >89 is normal, 60-89 shows mild impairment, 30-59 shows moderate impairment, 15-29 shows severe impairment and <15 is ESRD.     Jacquelyne Balint, Ur 07/10/2015 8.7* 0.0 - 1.9 mg/dL Final  . Creatinine,U 07/10/2015 215.9   Final  . Microalb Creat Ratio 07/10/2015 4.0  0.0 - 30.0 mg/g Final   TSH is higher than normal, therefore, will decrease the methimazole dose to 5 mg daily. We'll repeat TFTs in 5-6 weeks. CMP is normal. ACR normal. LDL high. Will verify with her if she is taking the pravastatin.

## 2015-07-13 MED ORDER — METHIMAZOLE 5 MG PO TABS: 5.0000 mg | ORAL_TABLET | Freq: Every day | ORAL | Status: DC

## 2015-08-18 ENCOUNTER — Other Ambulatory Visit: Payer: Self-pay | Admitting: Internal Medicine

## 2015-08-18 ENCOUNTER — Ambulatory Visit: Payer: BLUE CROSS/BLUE SHIELD | Admitting: Internal Medicine

## 2015-08-27 ENCOUNTER — Other Ambulatory Visit: Payer: BLUE CROSS/BLUE SHIELD

## 2015-08-28 ENCOUNTER — Other Ambulatory Visit: Payer: BLUE CROSS/BLUE SHIELD

## 2015-08-31 ENCOUNTER — Other Ambulatory Visit: Payer: Self-pay

## 2015-08-31 MED ORDER — TOPIRAMATE 25 MG PO TABS: ORAL_TABLET | ORAL | Status: DC

## 2015-09-02 ENCOUNTER — Other Ambulatory Visit (INDEPENDENT_AMBULATORY_CARE_PROVIDER_SITE_OTHER): Payer: BLUE CROSS/BLUE SHIELD

## 2015-09-02 DIAGNOSIS — E0500 Thyrotoxicosis with diffuse goiter without thyrotoxic crisis or storm: Secondary | ICD-10-CM | POA: Diagnosis not present

## 2015-09-02 LAB — T3, FREE: T3, Free: 2.9 pg/mL (ref 2.3–4.2)

## 2015-09-02 LAB — TSH: TSH: 2.57 u[IU]/mL (ref 0.35–4.50)

## 2015-09-02 LAB — T4, FREE: FREE T4: 0.67 ng/dL (ref 0.60–1.60)

## 2015-09-03 ENCOUNTER — Other Ambulatory Visit: Payer: Self-pay | Admitting: Internal Medicine

## 2015-09-03 DIAGNOSIS — E0500 Thyrotoxicosis with diffuse goiter without thyrotoxic crisis or storm: Secondary | ICD-10-CM

## 2015-09-03 MED ORDER — METHIMAZOLE 5 MG PO TABS: 2.5000 mg | ORAL_TABLET | Freq: Every day | ORAL | Status: DC

## 2015-09-22 ENCOUNTER — Other Ambulatory Visit: Payer: Self-pay | Admitting: Internal Medicine

## 2015-09-23 ENCOUNTER — Ambulatory Visit: Payer: BLUE CROSS/BLUE SHIELD | Admitting: Internal Medicine

## 2015-09-23 DIAGNOSIS — Z0289 Encounter for other administrative examinations: Secondary | ICD-10-CM

## 2015-10-03 ENCOUNTER — Other Ambulatory Visit: Payer: Self-pay | Admitting: Internal Medicine

## 2015-10-13 ENCOUNTER — Encounter: Payer: Self-pay | Admitting: Internal Medicine

## 2015-10-13 ENCOUNTER — Ambulatory Visit (INDEPENDENT_AMBULATORY_CARE_PROVIDER_SITE_OTHER): Payer: BLUE CROSS/BLUE SHIELD | Admitting: Internal Medicine

## 2015-10-13 VITALS — BP 130/82 | HR 99 | Ht 63.0 in | Wt 210.0 lb

## 2015-10-13 DIAGNOSIS — E119 Type 2 diabetes mellitus without complications: Secondary | ICD-10-CM | POA: Diagnosis not present

## 2015-10-13 DIAGNOSIS — E0500 Thyrotoxicosis with diffuse goiter without thyrotoxic crisis or storm: Secondary | ICD-10-CM | POA: Diagnosis not present

## 2015-10-13 LAB — T3, FREE: T3, Free: 2.9 pg/mL (ref 2.3–4.2)

## 2015-10-13 LAB — HEMOGLOBIN A1C: HEMOGLOBIN A1C: 6.3 % (ref 4.6–6.5)

## 2015-10-13 LAB — TSH: TSH: 2.29 u[IU]/mL (ref 0.35–4.50)

## 2015-10-13 LAB — T4, FREE: Free T4: 0.71 ng/dL (ref 0.60–1.60)

## 2015-10-13 MED ORDER — METOPROLOL SUCCINATE ER 25 MG PO TB24: ORAL_TABLET | ORAL | Status: DC

## 2015-10-13 NOTE — Progress Notes (Signed)
Patient ID: Ana ItoLashonda R [name], female   DOB: 03/18/1981, 35 y.o.   MRN: [id number]  HPI: Ana [name] is a 35 y.o.-year-old female, returning for f/u DM2, dx summer 2014, non-insulin-dependent, uncontrolled, without complications. Last visit 3 mo ago.    DM2: Last hemoglobin A1c was: Lab Results  Component Value Date   HGBA1C 5.7 07/10/2015   HGBA1C 6.1 12/24/2014   HGBA1C 5.7 09/18/2014  Previously 10.5% (10/2012).  Previously 6.5%.   Pt is on a regimen of: - Metformin XR 1000 mg po bid We had to stop Invokana 100 mg b/c repeated yeast inf. (09/26/2013) She was on JanuMet when she was prediabetic >> tolerated it well.   She has a One Touch Ultra meter.  Checking CBGs 1-2x a week: - am: 71-122 >> 71-121 >> 80-111 >> 90-128 >> 90-120s >> 91-121 >> 121-128 >> 90s >> 90s - 2h after b'fast: 98-118 >> n/c - lunch: 97-123 >> 90s >> 90-121 >> 90-120 >> 90-120 >> n/c - 2h after lunch: 111-132 >> 128-148 (1h after lunch) >> n/c >> 130s - dinner: 119-123 >> n/c >> 130-148 (snack before dinner) >> 90-120 >> 90-120 >> 100-120, 130 >> 130s - 2h after dinner: 120, 130 >> 95-114 >> n/c Highest 124 >> 130 >> 188 on steroids No lows. Lowest 90s.   - + mild CKD, last BUN/creatinine:  Lab Results  Component Value Date   BUN 17 07/10/2015   CREATININE 1.06 07/10/2015  Last ACR: normal, 5.7 at last check, but had proteinuria after pregnancy 2010.  - Pt has HL. Last lipids: Lab Results  Component Value Date   CHOL 187 07/10/2015   HDL 40.80 07/10/2015   LDLCALC 121* 07/10/2015   LDLDIRECT 117.8 03/07/2013   TRIG 126.0 07/10/2015   CHOLHDL 5 07/10/2015  On Pravastatin 10 >> 20 mg. - Last eye exam - 02/2013.  - no numbness and tingling in her feet.  Graves ds.: - latest TFTs: Lab Results  Component Value Date   TSH 2.57 09/02/2015   TSH 5.99* 07/10/2015   TSH 0.07* 12/24/2014   TSH 0.09* 11/11/2014   TSH 38.85* 09/18/2014   FREET4 0.67 09/02/2015   FREET4 0.62 07/10/2015    FREET4 0.74 12/24/2014   FREET4 1.28 11/11/2014   FREET4 0.16* 09/18/2014   I initially believed that she had subacute thyroiditis >> We started Toprol XL 25 mg >> now 50 mg, but the sxs persisted and her TFTs worsened >> we checked an uptake and scan that returned positive for Graves' disease.  We started MMI 10 mg bid and advised her to decrease the MMI dose to 10 mg in am and 5 mg in pm 4 months ago. She did not do this >> continued10 mg bid >> TSH 38 >> stopped MMI >> then TSH 0.09 >> restarted 5 mg MMI daily, then increased to 2x a day 6 mo ago >> again, did not come back for labs... She is currently on methimazole 2.5 mg daily starting 08/2015.  She stopped phentermine.  She c/o: - no nausea, AP, diarrhea - l+ weight gain - + fatigue - no heat and cold intolerance  - no anxiety - no tremors  She also has a history of PCOS, HL, HTN.   I reviewed pt's medications, allergies, PMH, social hx, family hx, and changes were documented in the history of present illness. Otherwise, unchanged from my initial visit note.   ROS: Constitutional: see HPI Eyes: no blurry vision, no xerophthalmia ENT: no  sore throat, no nodules palpated in throat, no dysphagia/odynophagia, no hoarseness Cardiovascular: no CP/SOB/+ palpitations/no leg swelling Respiratory: no cough/SOB Gastrointestinal: no N/V/D/C Musculoskeletal: no muscle aches/no joint aches Skin: no  rash Neurological: no tremors/numbness/tingling/dizziness, no HA  PE: BP 130/82 mmHg  Pulse 99  Ht 5\' 3"  (1.6 m)  Wt 210 lb (95.255 kg)  BMI 37.21 kg/m2  SpO2 99% Body mass index is 37.21 kg/(m^2).  Wt Readings from Last 3 Encounters:  10/13/15 210 lb (95.255 kg)  07/10/15 197 lb (89.359 kg)  05/20/15 202 lb (91.627 kg)   Constitutional: obese, in NAD Eyes: PERRLA, EOMI, no exophthalmos ENT: moist mucous membranes, + thyromegaly, no cervical lymphadenopathy Cardiovascular: tachycardia, RR, No MRG Respiratory: CTA  B Gastrointestinal: abdomen soft, NT, ND, BS+ Musculoskeletal: no deformities, strength intact in all 4 Skin: moist, warm, + acanthosis nigricans on neck Neurological: no tremor with outstretched hands, DTR normal in all 4  ASSESSMENT: 1. DM2, non-insulin-dependent, uncontrolled, without complications  2. Graves ds - 03/12/2014: Thyroid uptake and scan: Consistent with Graves' disease: The 24 hr uptake by the thyroid gland is 65%. Normal 24 hr uptake is 10-30%. On thyroid imaging, the thyroid activity appears diffusely increased. No focal hot or cold nodules are identified.  - On methimazole  PLAN:  1. Patient with fairly recent dx of diabetes, on oral regimen, with great DM control - I suggested to: continue Metformin XR 1000 mg 2x a day (we switched to XR b/c GI issues) - check sugars 3x a week, rotating checks - will check Hba1c today  - again advised to schedule an appt with the ophthalmologist  2. Graves ds - she was symptomatic at dx >> tachycardia, heat intolerance, tremors, and weight loss >> now all resolved. She has palpitations occasionally >> on Toprol XL 50 mg daily >> refilled. - on 2.5 mg daily MMI - Will recheck TFTs today - Continue Toprol XL  - Return to clinic in 4 months  Patient Instructions  Please stop at the lab.  Please continue Methimazole 2.5 mg daily.  Continue Metformin XR 1000 mg 2x a day.  Please return in 4 months with your sugar log.   Please call and schedule an eye appt with Dr. Randon GoldsmithLyles: [facility]:  Dr. Jeni SallesLyles Graham W. MD ?  Address: [address], [place], [place] [address]  [phone]  Office Visit on 10/13/2015  Component Date Value Ref Range Status  . Free T4 10/13/2015 0.71  0.60 - 1.60 ng/dL Final  . T3, Free 78/46/962906/27/2017 2.9  2.3 - 4.2 pg/mL Final  . TSH 10/13/2015 2.29  0.35 - 4.50 uIU/mL Final  . Hgb A1c MFr Bld 10/13/2015 6.3  4.6 - 6.5 % Final   Glycemic Control Guidelines for People with  Diabetes:Non Diabetic:  <6%Goal of Therapy: <7%Additional Action Suggested:  >8%   HbA1c slightly higher. Thyroid tests are great. We will go ahead and try to stop methimazole and repeat her thyroid tests in a month and a half.

## 2015-10-13 NOTE — Patient Instructions (Addendum)
  Patient Instructions  Please stop at the lab.  Please continue Methimazole 2.5 mg daily.  Continue Metformin XR 1000 mg 2x a day.  Please return in 4 months with your sugar log.   Please call and schedule an eye appt with Dr. Randon GoldsmithLyles: [facility]:  Dr. Jeni SallesLyles Graham W. MD ?  Address: [address], [place], [place] [address]  [phone]

## 2015-10-30 ENCOUNTER — Other Ambulatory Visit: Payer: Self-pay | Admitting: Internal Medicine

## 2015-11-28 ENCOUNTER — Other Ambulatory Visit: Payer: Self-pay | Admitting: Internal Medicine

## 2015-11-30 ENCOUNTER — Other Ambulatory Visit: Payer: Self-pay

## 2015-11-30 MED ORDER — PRAVASTATIN SODIUM 20 MG PO TABS: 20.0000 mg | ORAL_TABLET | Freq: Every day | ORAL | 2 refills | Status: DC

## 2015-12-27 ENCOUNTER — Other Ambulatory Visit: Payer: Self-pay | Admitting: Internal Medicine

## 2016-01-08 ENCOUNTER — Other Ambulatory Visit: Payer: Self-pay | Admitting: Internal Medicine

## 2016-01-23 ENCOUNTER — Other Ambulatory Visit: Payer: Self-pay | Admitting: Internal Medicine

## 2016-01-28 ENCOUNTER — Other Ambulatory Visit: Payer: BLUE CROSS/BLUE SHIELD

## 2016-02-11 ENCOUNTER — Ambulatory Visit (INDEPENDENT_AMBULATORY_CARE_PROVIDER_SITE_OTHER): Payer: BLUE CROSS/BLUE SHIELD | Admitting: Internal Medicine

## 2016-02-11 ENCOUNTER — Encounter: Payer: Self-pay | Admitting: Internal Medicine

## 2016-02-11 VITALS — BP 120/84 | HR 96 | Ht 62.5 in | Wt 211.0 lb

## 2016-02-11 DIAGNOSIS — E119 Type 2 diabetes mellitus without complications: Secondary | ICD-10-CM

## 2016-02-11 DIAGNOSIS — Z23 Encounter for immunization: Secondary | ICD-10-CM | POA: Diagnosis not present

## 2016-02-11 DIAGNOSIS — E0500 Thyrotoxicosis with diffuse goiter without thyrotoxic crisis or storm: Secondary | ICD-10-CM | POA: Diagnosis not present

## 2016-02-11 LAB — POCT GLYCOSYLATED HEMOGLOBIN (HGB A1C): Hemoglobin A1C: 6.3

## 2016-02-11 LAB — TSH: TSH: 0.21 u[IU]/mL — AB (ref 0.35–4.50)

## 2016-02-11 LAB — T4, FREE: FREE T4: 0.67 ng/dL (ref 0.60–1.60)

## 2016-02-11 LAB — T3, FREE: T3 FREE: 4 pg/mL (ref 2.3–4.2)

## 2016-02-11 MED ORDER — METOPROLOL SUCCINATE ER 25 MG PO TB24: ORAL_TABLET | ORAL | 1 refills | Status: DC

## 2016-02-11 NOTE — Progress Notes (Signed)
Patient ID: Ana [name], female   DOB: 1981-02-09, 35 y.o.   MRN: [id number]  HPI: Ana [name] is a 35 y.o.-year-old female, returning for f/u DM2, dx summer 2014, non-insulin-dependent, controlled, without complications. Last visit 4 mo ago.    DM2: Last hemoglobin A1c was: Lab Results  Component Value Date   HGBA1C 6.3 10/13/2015   HGBA1C 5.7 07/10/2015   HGBA1C 6.1 12/24/2014  Previously 10.5% (10/2012).  Previously 6.5%.   Pt is on a regimen of: - Metformin XR 1000 mg po bid We had to stop Invokana 100 mg b/c repeated yeast inf. (09/26/2013) She was on JanuMet when she was prediabetic >> tolerated it well.   She has a One Touch Ultra meter.  Checking CBGs 1-2x a week: - am:71-121 >> 80-111 >> 90-128 >> 90-120s >> 91-121 >> 121-128 >> 90s >> 90s >> 91-113 - 2h after b'fast: 98-118 >> n/c - lunch: 97-123 >> 90s >> 90-121 >> 90-120 >> 90-120 >> n/c - 2h after lunch: 111-132 >> 128-148 (1h after lunch) >> n/c >> 130s >> n/c - dinner: 130-148 (snack before dinner) >> 90-120 >> 90-120 >> 100-120, 130 >> 130s >> 90s-130s - 2h after dinner: 120, 130 >> 95-114 >> n/c Highest 124 >> 130 >> 188 on steroids >> n/c No lows. Lowest 90s. Highest: 130s.  - + mild CKD, last BUN/creatinine:  Lab Results  Component Value Date   BUN 17 07/10/2015   CREATININE 1.06 07/10/2015  Last ACR: normal, 5.7 at last check, but had proteinuria after pregnancy 2010.  - Pt has HL. Last lipids: Lab Results  Component Value Date   CHOL 187 07/10/2015   HDL 40.80 07/10/2015   LDLCALC 121 (H) 07/10/2015   LDLDIRECT 117.8 03/07/2013   TRIG 126.0 07/10/2015   CHOLHDL 5 07/10/2015  On Pravastatin 10 >> 20 mg. - Last eye exam - 02/2013.  - no numbness and tingling in her feet.  Graves ds.: - latest TFTs: Lab Results  Component Value Date   TSH 2.29 10/13/2015   TSH 2.57 09/02/2015   TSH 5.99 (H) 07/10/2015   TSH 0.07 (L) 12/24/2014   TSH 0.09 (L) 11/11/2014   FREET4 0.71 10/13/2015    FREET4 0.67 09/02/2015   FREET4 0.62 07/10/2015   FREET4 0.74 12/24/2014   FREET4 1.28 11/11/2014   I initially believed that she had subacute thyroiditis >> We started Toprol XL 25 mg >> now 50 mg, but the sxs persisted and her TFTs worsened >> we checked an uptake and scan that returned positive for Graves' disease. TSIs were also high.  Component     Latest Ref Rng & Units 12/18/2013  TSI     <140 % baseline 188 (H)    We started MMI 10 mg bid and advised her to decrease the MMI dose to 10 mg in am and 5 mg in pm 4 months ago. She did not do this >> continued10 mg bid >> TSH 38 >> stopped MMI >> then TSH 0.09 >> restarted 5 mg MMI daily, then increased to 2x a day 6 mo ago >> again, did not come back for labs... We decreased the methimazole to 2.5 mg daily in 08/2015 >> stopped it in 09/2015.  She is off phentermine.  She c/o: - + mm twitching >> started Mg in the last week - + fatigue - + palpitations - no nausea, AP, diarrhea - + weight gain - no heat and cold intolerance  - no anxiety - +  mild tremors  She also has a history of PCOS, HL, HTN.   I reviewed pt's medications, allergies, PMH, social hx, family hx, and changes were documented in the history of present illness. Otherwise, unchanged from my initial visit note.   ROS: Constitutional: see HPI Eyes: no blurry vision, no xerophthalmia ENT: no sore throat, no nodules palpated in throat, no dysphagia/odynophagia, no hoarseness Cardiovascular: no CP/SOB/+ palpitations/no leg swelling Respiratory: no cough/SOB Gastrointestinal: no N/V/D/C Musculoskeletal: no muscle aches/no joint aches Skin: no  rash Neurological: no tremors/numbness/tingling/dizziness, no HA  PE: BP 120/84 (BP Location: Left Arm, Patient Position: Sitting)   Pulse 96   Ht 5' 2.5" (1.588 m)   Wt 211 lb (95.7 kg)   SpO2 96%   BMI 37.98 kg/m  Body mass index is 37.98 kg/m.  Wt Readings from Last 3 Encounters:  02/11/16 211 lb (95.7 kg)   10/13/15 210 lb (95.3 kg)  07/10/15 197 lb (89.4 kg)   Constitutional: obese, in NAD Eyes: PERRLA, EOMI, no exophthalmos ENT: moist mucous membranes, + thyromegaly, no cervical lymphadenopathy Cardiovascular: tachycardia, RR, No MRG Respiratory: CTA B Gastrointestinal: abdomen soft, NT, ND, BS+ Musculoskeletal: no deformities, strength intact in all 4 Skin: moist, warm, + acanthosis nigricans on neck Neurological: no tremor with outstretched hands, DTR normal in all 4  ASSESSMENT: 1. DM2, non-insulin-dependent, controlled, without complications  2. Graves ds - 03/12/2014: Thyroid uptake and scan: Consistent with Graves' disease: The 24 hr uptake by the thyroid gland is 65%. Normal 24 hr uptake is 10-30%. On thyroid imaging, the thyroid activity appears diffusely increased. No focal hot or cold nodules are identified.  PLAN:  1. Patient with fairly recent dx of diabetes, on oral regimen, with great DM control - I suggested to: continue Metformin XR 1000 mg 2x a day (we switched to XR b/c GI issues) - check sugars 3x a week, rotating checks - will check Hba1c today >> 6.3% (great!) - again advised to schedule an appt with the ophthalmologist  2. Graves ds - she was symptomatic at dx >> tachycardia, heat intolerance, tremors, and weight loss >> now with mm twitching, still having palpitations. On Toprol XL 50 mg daily >> refilled. - off MMI x 4 mo >> feels that some of her sxs are back (see above) - Will recheck TFTs today + TSI - Continue Toprol XL  - we discussed that if TFTs are abnormal again >> we can either use RAI tx or continue a low dose of MMI long term. She agrees with RAI if needs to be done. - Return to clinic in 3-4 months  Patient Instructions  Please stop at the lab.  Please continue Methimazole 2.5 mg daily.  Continue Metformin XR 1000 mg 2x a day.  Please return in 4 months with your sugar log.   Please call and schedule an eye appt with Dr.  Randon GoldsmithLyles: [facility]:  Dr. Jeni SallesLyles Graham W. MD ?  Address: [address], [place], [place] [address]  [phone]  Component     Latest Ref Rng & Units 02/11/2016  Hemoglobin A1C      6.3  TSH     0.35 - 4.50 uIU/mL 0.21 (L)  T4,Free(Direct)     0.60 - 1.60 ng/dL 8.650.67  Triiodothyronine,Free,Serum     2.3 - 4.2 pg/mL 4.0  TSI     <140 % baseline 152 (H)   TSH is minimally low. TSI is slightly better, overall not very high. Free T4 and  free T3 are normal. For now, we can increase the methimazole to 5 mg alternating with 2.5 mg every other day and recheck her TFTs in 1.5 months.  Carlus Pavlov, MD PhD [facility] Endocrinology

## 2016-02-11 NOTE — Patient Instructions (Addendum)
Please continue to stay off Methimazole but continue Toprol XL 25 mg 2x a day.  Please stop at the lab.  Continue Metformin XR 1000 mg 2x a day.  Please return in 3-4 months with your sugar log.   Please call and schedule an eye appt with Dr. Randon GoldsmithLyles: [facility]:  Dr. Jeni SallesLyles Graham W. MD ?  Address: [address], [place], [place] [address]  [phone]

## 2016-02-12 ENCOUNTER — Telehealth: Payer: Self-pay

## 2016-02-12 ENCOUNTER — Telehealth: Payer: Self-pay | Admitting: Internal Medicine

## 2016-02-12 NOTE — Telephone Encounter (Signed)
Pt is awaiting lab results, she does not see them on my chart

## 2016-02-12 NOTE — Telephone Encounter (Signed)
Called patient and advised that not all the labs were in yet, and Dr.Gherghe has not resulted them to me yet. I will call her when we get them all in. Patient understood and had no other questions.

## 2016-02-15 LAB — THYROID STIMULATING IMMUNOGLOBULIN: TSI: 152 %{baseline} — AB (ref <140)

## 2016-02-16 DIAGNOSIS — Z23 Encounter for immunization: Secondary | ICD-10-CM | POA: Diagnosis not present

## 2016-02-16 DIAGNOSIS — E119 Type 2 diabetes mellitus without complications: Secondary | ICD-10-CM | POA: Diagnosis not present

## 2016-02-16 MED ORDER — METHIMAZOLE 5 MG PO TABS: ORAL_TABLET | ORAL | 1 refills | Status: DC

## 2016-02-17 ENCOUNTER — Other Ambulatory Visit: Payer: Self-pay | Admitting: Internal Medicine

## 2016-02-21 ENCOUNTER — Other Ambulatory Visit: Payer: Self-pay | Admitting: Internal Medicine

## 2016-03-22 ENCOUNTER — Other Ambulatory Visit: Payer: Self-pay | Admitting: Internal Medicine

## 2016-04-12 ENCOUNTER — Other Ambulatory Visit: Payer: Self-pay | Admitting: Internal Medicine

## 2016-04-26 ENCOUNTER — Other Ambulatory Visit: Payer: Self-pay | Admitting: Internal Medicine

## 2016-05-18 ENCOUNTER — Ambulatory Visit: Payer: BLUE CROSS/BLUE SHIELD | Admitting: Internal Medicine

## 2016-05-24 ENCOUNTER — Other Ambulatory Visit: Payer: Self-pay | Admitting: Internal Medicine

## 2016-05-28 ENCOUNTER — Other Ambulatory Visit: Payer: Self-pay | Admitting: Internal Medicine

## 2016-06-22 ENCOUNTER — Encounter: Payer: Self-pay | Admitting: Internal Medicine

## 2016-06-23 ENCOUNTER — Other Ambulatory Visit: Payer: Self-pay | Admitting: Internal Medicine

## 2016-06-23 DIAGNOSIS — L309 Dermatitis, unspecified: Secondary | ICD-10-CM | POA: Diagnosis not present

## 2016-06-30 ENCOUNTER — Other Ambulatory Visit (INDEPENDENT_AMBULATORY_CARE_PROVIDER_SITE_OTHER): Payer: BLUE CROSS/BLUE SHIELD

## 2016-06-30 DIAGNOSIS — E0500 Thyrotoxicosis with diffuse goiter without thyrotoxic crisis or storm: Secondary | ICD-10-CM | POA: Diagnosis not present

## 2016-06-30 LAB — T3, FREE: T3, Free: 5.9 pg/mL — ABNORMAL HIGH (ref 2.3–4.2)

## 2016-06-30 LAB — TSH

## 2016-06-30 LAB — T4, FREE: FREE T4: 1.47 ng/dL (ref 0.60–1.60)

## 2016-06-30 MED ORDER — METHIMAZOLE 5 MG PO TABS: ORAL_TABLET | ORAL | 2 refills | Status: DC

## 2016-07-14 ENCOUNTER — Other Ambulatory Visit: Payer: Self-pay | Admitting: Internal Medicine

## 2016-07-27 ENCOUNTER — Other Ambulatory Visit: Payer: Self-pay | Admitting: Internal Medicine

## 2016-08-03 ENCOUNTER — Encounter: Payer: Self-pay | Admitting: Internal Medicine

## 2016-08-03 ENCOUNTER — Ambulatory Visit (INDEPENDENT_AMBULATORY_CARE_PROVIDER_SITE_OTHER): Payer: BLUE CROSS/BLUE SHIELD | Admitting: Internal Medicine

## 2016-08-03 VITALS — BP 116/72 | HR 103 | Ht 62.0 in | Wt 204.0 lb

## 2016-08-03 DIAGNOSIS — E559 Vitamin D deficiency, unspecified: Secondary | ICD-10-CM

## 2016-08-03 DIAGNOSIS — E119 Type 2 diabetes mellitus without complications: Secondary | ICD-10-CM | POA: Diagnosis not present

## 2016-08-03 DIAGNOSIS — E0500 Thyrotoxicosis with diffuse goiter without thyrotoxic crisis or storm: Secondary | ICD-10-CM | POA: Diagnosis not present

## 2016-08-03 LAB — COMPLETE METABOLIC PANEL WITH GFR
ALBUMIN: 3.7 g/dL (ref 3.6–5.1)
ALK PHOS: 94 U/L (ref 33–115)
ALT: 16 U/L (ref 6–29)
AST: 14 U/L (ref 10–30)
BUN: 14 mg/dL (ref 7–25)
CALCIUM: 9.2 mg/dL (ref 8.6–10.2)
CHLORIDE: 108 mmol/L (ref 98–110)
CO2: 22 mmol/L (ref 20–31)
Creat: 0.85 mg/dL (ref 0.50–1.10)
GFR, EST NON AFRICAN AMERICAN: 88 mL/min (ref >=60)
GFR, Est African American: >89 mL/min (ref >=60)
GLUCOSE: 129 mg/dL — AB (ref 65–99)
POTASSIUM: 4.8 mmol/L (ref 3.5–5.3)
Sodium: 139 mmol/L (ref 135–146)
Total Bilirubin: 0.3 mg/dL (ref 0.2–1.2)
Total Protein: 7 g/dL (ref 6.1–8.1)

## 2016-08-03 LAB — LIPID PANEL
CHOLESTEROL: 180 mg/dL (ref 0–200)
HDL: 40.4 mg/dL (ref >39.00)
LDL Cholesterol: 102 mg/dL — ABNORMAL HIGH (ref 0–99)
NonHDL: 140.06
TRIGLYCERIDES: 189 mg/dL — AB (ref 0.0–149.0)
Total CHOL/HDL Ratio: 4
VLDL: 37.8 mg/dL (ref 0.0–40.0)

## 2016-08-03 LAB — MICROALBUMIN / CREATININE URINE RATIO
Creatinine,U: 125.9 mg/dL
MICROALB UR: 13 mg/dL — AB (ref 0.0–1.9)
Microalb Creat Ratio: 10.3 mg/g (ref 0.0–30.0)

## 2016-08-03 LAB — T3, FREE: T3, Free: 4.1 pg/mL (ref 2.3–4.2)

## 2016-08-03 LAB — T4, FREE: FREE T4: 0.81 ng/dL (ref 0.60–1.60)

## 2016-08-03 LAB — TSH

## 2016-08-03 LAB — VITAMIN D 25 HYDROXY (VIT D DEFICIENCY, FRACTURES): VITD: 19.29 ng/mL — ABNORMAL LOW (ref 30.00–100.00)

## 2016-08-03 LAB — POCT GLYCOSYLATED HEMOGLOBIN (HGB A1C): Hemoglobin A1C: 6.7

## 2016-08-03 MED ORDER — METFORMIN HCL ER 500 MG PO TB24: ORAL_TABLET | ORAL | 3 refills | Status: DC

## 2016-08-03 MED ORDER — METHIMAZOLE 5 MG PO TABS: ORAL_TABLET | ORAL | 2 refills | Status: DC

## 2016-08-03 MED ORDER — METOPROLOL SUCCINATE ER 25 MG PO TB24: ORAL_TABLET | ORAL | 3 refills | Status: DC

## 2016-08-03 NOTE — Patient Instructions (Signed)
Please stop at the lab.  Please continue Methimazole 5 mg in am and 2.5 mg daily.  Continue Metformin XR 1000 mg 2x a day.  Please return in 4 months with your sugar log.   Please call and schedule an eye appt with Dr. Randon Goldsmith: [facility]:  Dr. Jeni Salles MD ?  Address: [address] [place], [place], [place] [address]  [phone]

## 2016-08-03 NOTE — Progress Notes (Signed)
Patient ID: Ana [name], female   DOB: 10-26-1980, 36 y.o.   MRN: [id number]  HPI: Ana [name] is a 36 y.o.-year-old female, returning for f/u DM2, dx summer 2014, non-insulin-dependent, controlled, without complications and also Graves ds. Last visit 6 mo ago.    DM2: Last hemoglobin A1c was: Lab Results  Component Value Date   HGBA1C 6.3 02/11/2016   HGBA1C 6.3 10/13/2015   HGBA1C 5.7 07/10/2015  Previously 10.5% (10/2012).  Previously 6.5%.   Pt is on a regimen of: - Metformin XR 1000 mg po bid We had to stop Invokana 100 mg b/c repeated yeast inf. (09/26/2013) She was on JanuMet when she was prediabetic >> tolerated it well.   She has a One Touch Ultra meter.  Checking CBGs 1-2x a week: - am: 91-121 >> 121-128 >> 90s >> 90s >> 91-113 >> 91-100 - 2h after b'fast: 98-118 >> n/c - lunch:  90s >> 90-121 >> 90-120 >> 90-120 >> n/c - 2h after lunch: 128-148 (1h after lunch) >> n/c >> 130s >> 120s - dinner: 90-120 >> 100-120, 130 >> 130s >> 90s-130s >> n/c - 2h after dinner: 120, 130 >> 95-114 >> n/c Highest 124 >> 130 >> 188 on steroids >> n/c No lows. Lowest 90s >> 71. Highest: 130s >> 140.  - + mild CKD, last BUN/creatinine:  Lab Results  Component Value Date   BUN 17 07/10/2015   CREATININE 1.06 07/10/2015  Last ACR: normal, 5.7 at last check, but had proteinuria after pregnancy 2010.  - Pt has HL. Last lipids: Lab Results  Component Value Date   CHOL 187 07/10/2015   HDL 40.80 07/10/2015   LDLCALC 121 (H) 07/10/2015   LDLDIRECT 117.8 03/07/2013   TRIG 126.0 07/10/2015   CHOLHDL 5 07/10/2015  On Pravastatin 10 >> 20 mg. - Last eye exam - 02/2013.  - no numbness and tingling in her feet.  Graves ds.: - latest TFTs: Lab Results  Component Value Date   TSH <0.01 (L) 06/30/2016   TSH 0.21 (L) 02/11/2016   TSH 2.29 10/13/2015   TSH 2.57 09/02/2015   TSH 5.99 (H) 07/10/2015   FREET4 1.47 06/30/2016   FREET4 0.67 02/11/2016   FREET4 0.71 10/13/2015    FREET4 0.67 09/02/2015   FREET4 0.62 07/10/2015   I initially believed that she had subacute thyroiditis >> We started Toprol XL 25 mg >> now 50 mg, but the sxs persisted and her TFTs worsened >> we checked an uptake and scan that returned positive for Graves' disease. TSIs were also high.  Lab Results  Component Value Date   TSI 152 (H) 02/11/2016   TSI 188 (H) 12/18/2013   We started MMI 10 mg bid and advised her to decrease the MMI dose to 10 mg in am and 5 mg in pm 4 months ago. She did not do this >> continued10 mg bid >> TSH 38 >> stopped MMI >> then TSH 0.09 >> restarted 5 mg MMI daily, then increased to 2x a day 6 mo ago >> again, did not come back for labs... We decreased the methimazole to 2.5 mg daily in 08/2015 >> stopped it in 09/2015. However, we had to restart at last visit and then increase to 5 mg in am and 2.5 mg in pm >> she continues on this dose now.  She c/o: - + fatigue - + much improved palpitations - + hot flushes - no nausea, AP, diarrhea - + weight gain and loss -  no heat and cold intolerance  - no anxiety - + resolved tremors  She also has a history of PCOS, HL, HTN.   She has a h/o vitamin D def. >> was on Ergocalciferol 2 years ago >> now on a MVI only.  I reviewed pt's medications, allergies, PMH, social hx, family hx, and changes were documented in the history of present illness. Otherwise, unchanged from my initial visit note.   ROS: Constitutional: see HPI Eyes: no blurry vision, no xerophthalmia ENT: no sore throat, no nodules palpated in throat, no dysphagia/odynophagia, no hoarseness Cardiovascular: no CP/SOB/+ improved palpitations/no leg swelling Respiratory: no cough/SOB Gastrointestinal: no N/V/D/C Musculoskeletal: no muscle aches/no joint aches Skin: no  rash Neurological: no tremors/numbness/tingling/dizziness, + HA  PE: BP 116/72   Pulse (!) 103   Ht  (1.575 m)   Wt 204 lb (92.5 kg)   SpO2 97%   BMI 37.31 kg/m  Body  mass index is 37.31 kg/m.  Wt Readings from Last 3 Encounters:  08/03/16 204 lb (92.5 kg)  02/11/16 211 lb (95.7 kg)  10/13/15 210 lb (95.3 kg)   Constitutional: obese, in NAD Eyes: PERRLA, EOMI, no exophthalmos ENT: moist mucous membranes, + thyromegaly, no cervical lymphadenopathy Cardiovascular: tachycardia, RR, No MRG Respiratory: CTA B Gastrointestinal: abdomen soft, NT, ND, BS+ Musculoskeletal: no deformities, strength intact in all 4 Skin: moist, warm, + acanthosis nigricans on neck Neurological: no tremor with outstretched hands, DTR normal in all 4  ASSESSMENT: 1. DM2, non-insulin-dependent, controlled, without complications  2. Graves ds - 03/12/2014: Thyroid uptake and scan: Consistent with Graves' disease: The 24 hr uptake by the thyroid gland is 65%. Normal 24 hr uptake is 10-30%. On thyroid imaging, the thyroid activity appears diffusely increased. No focal hot or cold nodules are identified.  3. Vitamin D def.  PLAN:  1. Patient with h/o controlled diabetes, on oral regimen, with good DM control - I suggested to: continue Metformin XR 1000 mg 2x a day (we switched to XR b/c GI issues) - she checks sugars 3x a week, rotating checks >> at goal - will check annual labs today - will check Hba1c today >> 6.7% (higher) - again advised to schedule an appt with the ophthalmologist >> given info again  2. Graves ds - she was symptomatic at dx >> tachycardia, heat intolerance, tremors, and weight loss >> now all improved. On Toprol XL 50 mg daily >> will continue. Her pulse is high today. - off MMI x 4 mo last year, but we had to restart >> now at 5 mg in am and 2.5 mg in pm - we discussed about RAI tx or continuing MMI. She agrees with RAI if needs to be done, but will try to continue MMI for now and try not to stop it in the future but stay on a low dose if we can. - Will recheck TFTs today - Return to clinic in 4 months  Patient Instructions  Please stop at the  lab.  Please continue Methimazole 5 mg in am and 2.5 mg daily.  Continue Metformin XR 1000 mg 2x a day.  Please return in 4 months with your sugar log.   Please call and schedule an eye appt with Dr. Randon Goldsmith: [facility]:  Dr. Jeni Salles MD ?  Address: [address] [place], [place], [place] [address]  [phone]  3. Vit D def - will check today - Only on multivitamins now  Office Visit on 08/03/2016  Component  Date Value Ref Range Status  . TSH 08/03/2016 <0.01* 0.35 - 4.50 uIU/mL Final  . Free T4 08/03/2016 0.81  0.60 - 1.60 ng/dL Final   Comment: Specimens from patients who are undergoing biotin therapy and /or ingesting biotin supplements may contain high levels of biotin.  The higher biotin concentration in these specimens interferes with this Free T4 assay.  Specimens that contain high levels  of biotin may cause false high results for this Free T4 assay.  Please interpret results in light of the total clinical presentation of the patient.    . T3, Free 08/03/2016 4.1  2.3 - 4.2 pg/mL Final  . Sodium 08/03/2016 139  135 - 146 mmol/L Final  . Potassium 08/03/2016 4.8  3.5 - 5.3 mmol/L Final  . Chloride 08/03/2016 108  98 - 110 mmol/L Final  . CO2 08/03/2016 22  20 - 31 mmol/L Final  . Glucose, Bld 08/03/2016 129* 65 - 99 mg/dL Final  . BUN 81/19/1478 14  7 - 25 mg/dL Final  . Creat 29/56/2130 0.85  0.50 - 1.10 mg/dL Final  . Total Bilirubin 08/03/2016 0.3  0.2 - 1.2 mg/dL Final  . Alkaline Phosphatase 08/03/2016 94  33 - 115 U/L Final  . AST 08/03/2016 14  10 - 30 U/L Final  . ALT 08/03/2016 16  6 - 29 U/L Final  . Total Protein 08/03/2016 7.0  6.1 - 8.1 g/dL Final  . Albumin 86/57/8469 3.7  3.6 - 5.1 g/dL Final  . Calcium 62/95/2841 9.2  8.6 - 10.2 mg/dL Final  . GFR, Est African American 08/03/2016 >89  >=60 mL/min Final  . GFR, Est Non African American 08/03/2016 88  >=60 mL/min Final  . Cholesterol 08/03/2016 180  0 - 200 mg/dL Final  .  Triglycerides 08/03/2016 189.0* 0.0 - 149.0 mg/dL Final  . HDL 32/44/0102 40.40  >39.00 mg/dL Final  . VLDL 72/53/6644 37.8  0.0 - 40.0 mg/dL Final  . LDL Cholesterol 08/03/2016 102* 0 - 99 mg/dL Final  . Total CHOL/HDL Ratio 08/03/2016 4   Final  . NonHDL 08/03/2016 140.06   Final  . Microalb, Ur 08/03/2016 13.0* 0.0 - 1.9 mg/dL Final  . Creatinine,U 03/47/4259 125.9  mg/dL Final  . Microalb Creat Ratio 08/03/2016 10.3  0.0 - 30.0 mg/g Final  . Hemoglobin A1C 08/03/2016 6.7   Final  . VITD 08/03/2016 19.29* 30.00 - 100.00 ng/mL Final   Vitamin D is low >> Will add 4000 units vitamin D daily. He will need a recheck when she comes back. Free T3 has improved, but TSH is still low. For now, I would suggest to increase the methimazole to 5 mg twice a day and come back for labs in 1.5 months.  Carlus Pavlov, MD PhD [facility]

## 2016-08-04 MED ORDER — METHIMAZOLE 5 MG PO TABS: ORAL_TABLET | ORAL | 2 refills | Status: DC

## 2016-08-27 ENCOUNTER — Other Ambulatory Visit: Payer: Self-pay | Admitting: Internal Medicine

## 2016-08-29 ENCOUNTER — Other Ambulatory Visit: Payer: Self-pay

## 2016-08-29 ENCOUNTER — Other Ambulatory Visit: Payer: Self-pay | Admitting: Internal Medicine

## 2016-08-29 MED ORDER — METHIMAZOLE 5 MG PO TABS: ORAL_TABLET | ORAL | 2 refills | Status: DC

## 2016-09-27 ENCOUNTER — Encounter: Payer: Self-pay | Admitting: Internal Medicine

## 2016-09-28 ENCOUNTER — Other Ambulatory Visit: Payer: Self-pay | Admitting: Internal Medicine

## 2016-09-30 ENCOUNTER — Encounter: Payer: Self-pay | Admitting: Nurse Practitioner

## 2016-09-30 ENCOUNTER — Ambulatory Visit (INDEPENDENT_AMBULATORY_CARE_PROVIDER_SITE_OTHER): Payer: BLUE CROSS/BLUE SHIELD | Admitting: Nurse Practitioner

## 2016-09-30 ENCOUNTER — Ambulatory Visit (INDEPENDENT_AMBULATORY_CARE_PROVIDER_SITE_OTHER)
Admission: RE | Admit: 2016-09-30 | Discharge: 2016-09-30 | Disposition: A | Discharge status: Home / Self Care | Payer: BLUE CROSS/BLUE SHIELD | Source: Ambulatory Visit | Attending: Nurse Practitioner | Admitting: Nurse Practitioner

## 2016-09-30 DIAGNOSIS — M533 Sacrococcygeal disorders, not elsewhere classified: Secondary | ICD-10-CM | POA: Diagnosis not present

## 2016-09-30 DIAGNOSIS — E559 Vitamin D deficiency, unspecified: Secondary | ICD-10-CM | POA: Diagnosis not present

## 2016-09-30 DIAGNOSIS — M545 Low back pain, unspecified: Secondary | ICD-10-CM

## 2016-09-30 DIAGNOSIS — M25519 Pain in unspecified shoulder: Secondary | ICD-10-CM | POA: Diagnosis not present

## 2016-09-30 DIAGNOSIS — M542 Cervicalgia: Secondary | ICD-10-CM

## 2016-09-30 MED ORDER — VITAMIN D3 1.25 MG (50000 UT) PO CAPS: 1.0000 | ORAL_CAPSULE | ORAL | 1 refills | Status: DC

## 2016-09-30 MED ORDER — METHOCARBAMOL 750 MG PO TABS: 750.0000 mg | ORAL_TABLET | Freq: Three times a day (TID) | ORAL | 0 refills | Status: DC | PRN

## 2016-09-30 MED ORDER — IBUPROFEN 800 MG PO TABS: 800.0000 mg | ORAL_TABLET | Freq: Three times a day (TID) | ORAL | 0 refills | Status: DC | PRN
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
## 2016-09-30 NOTE — Patient Instructions (Signed)
Alternate between warm and cold compress as needed.  Will need referral to sport medicine if no improvement in 2weeks.  Back Pain, Adult Back pain is very common. The pain often gets better over time. The cause of back pain is usually not dangerous. Most people can learn to manage their back pain on their own. Follow these instructions at home: Watch your back pain for any changes. The following actions may help to lessen any pain you are feeling:  Stay active. Start with short walks on flat ground if you can. Try to walk farther each day.  Exercise regularly as told by your doctor. Exercise helps your back heal faster. It also helps avoid future injury by keeping your muscles strong and flexible.  Do not sit, drive, or stand in one place for more than 30 minutes.  Do not stay in bed. Resting more than 1-2 days can slow down your recovery.  Be careful when you bend or lift an object. Use good form when lifting: ? Bend at your knees. ? Keep the object close to your body. ? Do not twist.  Sleep on a firm mattress. Lie on your side, and bend your knees. If you lie on your back, put a pillow under your knees.  Take medicines only as told by your doctor.  Put ice on the injured area. ? Put ice in a plastic bag. ? Place a towel between your skin and the bag. ? Leave the ice on for 20 minutes, 2-3 times a day for the first 2-3 days. After that, you can switch between ice and heat packs.  Avoid feeling anxious or stressed. Find good ways to deal with stress, such as exercise.  Maintain a healthy weight. Extra weight puts stress on your back.  Contact a doctor if:  You have pain that does not go away with rest or medicine.  You have worsening pain that goes down into your legs or buttocks.  You have pain that does not get better in one week.  You have pain at night.  You lose weight.  You have a fever or chills. Get help right away if:  You cannot control when you poop (bowel  movement) or pee (urinate).  Your arms or legs feel weak.  Your arms or legs lose feeling (numbness).  You feel sick to your stomach (nauseous) or throw up (vomit).  You have belly (abdominal) pain.  You feel like you may pass out (faint). This information is not intended to replace advice given to you by your health care provider. Make sure you discuss any questions you have with your health care provider. Document Released: 09/21/2007 Document Revised: 09/10/2015 Document Reviewed: 08/06/2013 Elsevier Interactive Patient Education  2018 [employer].   Back Exercises If you have pain in your back, do these exercises 2-3 times each day or as told by your doctor. When the pain goes away, do the exercises once each day, but repeat the steps more times for each exercise (do more repetitions). If you do not have pain in your back, do these exercises once each day or as told by your doctor. Exercises Single Knee to Chest  Do these steps 3-5 times in a row for each leg: 1. Lie on your back on a firm bed or the floor with your legs stretched out. 2. Bring one knee to your chest. 3. Hold your knee to your chest by grabbing your knee or thigh. 4. Pull on your knee until you feel  a gentle stretch in your lower back. 5. Keep doing the stretch for 10-30 seconds. 6. Slowly let go of your leg and straighten it.  Pelvic Tilt  Do these steps 5-10 times in a row: 1. Lie on your back on a firm bed or the floor with your legs stretched out. 2. Bend your knees so they point up to the ceiling. Your feet should be flat on the floor. 3. Tighten your lower belly (abdomen) muscles to press your lower back against the floor. This will make your tailbone point up to the ceiling instead of pointing down to your feet or the floor. 4. Stay in this position for 5-10 seconds while you gently tighten your muscles and breathe evenly.  Cat-Cow  Do these steps until your lower back bends more easily: 1. Get on  your hands and knees on a firm surface. Keep your hands under your shoulders, and keep your knees under your hips. You may put padding under your knees. 2. Let your head hang down, and make your tailbone point down to the floor so your lower back is round like the back of a cat. 3. Stay in this position for 5 seconds. 4. Slowly lift your head and make your tailbone point up to the ceiling so your back hangs low (sags) like the back of a cow. 5. Stay in this position for 5 seconds.  Press-Ups  Do these steps 5-10 times in a row: 1. Lie on your belly (face-down) on the floor. 2. Place your hands near your head, about shoulder-width apart. 3. While you keep your back relaxed and keep your hips on the floor, slowly straighten your arms to raise the top half of your body and lift your shoulders. Do not use your back muscles. To make yourself more comfortable, you may change where you place your hands. 4. Stay in this position for 5 seconds. 5. Slowly return to lying flat on the floor.  Bridges  Do these steps 10 times in a row: 1. Lie on your back on a firm surface. 2. Bend your knees so they point up to the ceiling. Your feet should be flat on the floor. 3. Tighten your butt muscles and lift your butt off of the floor until your waist is almost as high as your knees. If you do not feel the muscles working in your butt and the back of your thighs, slide your feet 1-2 inches farther away from your butt. 4. Stay in this position for 3-5 seconds. 5. Slowly lower your butt to the floor, and let your butt muscles relax.  If this exercise is too easy, try doing it with your arms crossed over your chest. Belly Crunches  Do these steps 5-10 times in a row: 1. Lie on your back on a firm bed or the floor with your legs stretched out. 2. Bend your knees so they point up to the ceiling. Your feet should be flat on the floor. 3. Cross your arms over your chest. 4. Tip your chin a little bit toward your  chest but do not bend your neck. 5. Tighten your belly muscles and slowly raise your chest just enough to lift your shoulder blades a tiny bit off of the floor. 6. Slowly lower your chest and your head to the floor.  Back Lifts Do these steps 5-10 times in a row: 1. Lie on your belly (face-down) with your arms at your sides, and rest your forehead on the floor. 2.  Tighten the muscles in your legs and your butt. 3. Slowly lift your chest off of the floor while you keep your hips on the floor. Keep the back of your head in line with the curve in your back. Look at the floor while you do this. 4. Stay in this position for 3-5 seconds. 5. Slowly lower your chest and your face to the floor.  Contact a doctor if:  Your back pain gets a lot worse when you do an exercise.  Your back pain does not lessen 2 hours after you exercise. If you have any of these problems, stop doing the exercises. Do not do them again unless your doctor says it is okay. Get help right away if:  You have sudden, very bad back pain. If this happens, stop doing the exercises. Do not do them again unless your doctor says it is okay. This information is not intended to replace advice given to you by your health care provider. Make sure you discuss any questions you have with your health care provider. Document Released: 05/07/2010 Document Revised: 09/10/2015 Document Reviewed: 05/29/2014 Elsevier Interactive Patient Education  [employer].

## 2016-10-24 ENCOUNTER — Other Ambulatory Visit: Payer: Self-pay | Admitting: Internal Medicine

## 2016-10-28 ENCOUNTER — Other Ambulatory Visit (INDEPENDENT_AMBULATORY_CARE_PROVIDER_SITE_OTHER): Payer: BLUE CROSS/BLUE SHIELD

## 2016-10-28 DIAGNOSIS — E0500 Thyrotoxicosis with diffuse goiter without thyrotoxic crisis or storm: Secondary | ICD-10-CM

## 2016-10-28 LAB — TSH: TSH: 0.74 u[IU]/mL (ref 0.35–4.50)

## 2016-10-28 LAB — T4, FREE: FREE T4: 0.47 ng/dL — AB (ref 0.60–1.60)

## 2016-10-28 LAB — T3, FREE: T3, Free: 3.2 pg/mL (ref 2.3–4.2)

## 2016-11-24 ENCOUNTER — Other Ambulatory Visit: Payer: Self-pay | Admitting: Internal Medicine

## 2016-11-26 ENCOUNTER — Other Ambulatory Visit: Payer: Self-pay | Admitting: Internal Medicine

## 2016-12-01 ENCOUNTER — Ambulatory Visit (INDEPENDENT_AMBULATORY_CARE_PROVIDER_SITE_OTHER): Payer: BLUE CROSS/BLUE SHIELD | Admitting: Internal Medicine

## 2016-12-01 ENCOUNTER — Encounter: Payer: Self-pay | Admitting: Internal Medicine

## 2016-12-01 VITALS — BP 128/88 | HR 94 | Ht 63.0 in | Wt 211.0 lb

## 2016-12-01 DIAGNOSIS — E119 Type 2 diabetes mellitus without complications: Secondary | ICD-10-CM | POA: Diagnosis not present

## 2016-12-01 DIAGNOSIS — E0500 Thyrotoxicosis with diffuse goiter without thyrotoxic crisis or storm: Secondary | ICD-10-CM

## 2016-12-01 DIAGNOSIS — E559 Vitamin D deficiency, unspecified: Secondary | ICD-10-CM | POA: Diagnosis not present

## 2016-12-01 LAB — T4, FREE: FREE T4: 0.74 ng/dL (ref 0.60–1.60)

## 2016-12-01 LAB — TSH: TSH: 1.77 u[IU]/mL (ref 0.35–4.50)

## 2016-12-01 LAB — T3, FREE: T3, Free: 3.3 pg/mL (ref 2.3–4.2)

## 2016-12-01 LAB — POCT GLYCOSYLATED HEMOGLOBIN (HGB A1C): Hemoglobin A1C: 8.4

## 2016-12-01 MED ORDER — METHIMAZOLE 5 MG PO TABS: ORAL_TABLET | ORAL | 3 refills | Status: DC

## 2016-12-01 NOTE — Progress Notes (Signed)
Patient ID: Ana [name], female   DOB: Nov 07, 1980, 36 y.o.   MRN: [id number]  HPI: Ana [name] is a 36 y.o.-year-old female, returning for f/u DM2, dx summer 2014, non-insulin-dependent, controlled, without complications and also Graves ds. Last visit 4 mo ago.  She started to drink sodas every day since last visit!   Reviewed latest hemoglobin A1c was: Lab Results  Component Value Date   HGBA1C 6.7 08/03/2016   HGBA1C 6.3 02/11/2016   HGBA1C 6.3 10/13/2015  Previously 10.5% (10/2012).  Previously 6.5%.   Pt is on a regimen of: - Metformin XR 1000 mg bid We had to stop Invokana 100 mg b/c repeated yeast inf. (09/26/2013) She was on JanuMet when she was prediabetic >> tolerated it well.   She has a One Touch Ultra meter.  Checking CBGs 1-2x a week: - am: 91-121 >> 121-128 >> 90s >> 90s >> 91-113 >> 91-100 >> 110-111 - 2h after b'fast: 98-118 >> n/c - lunch:  90s >> 90-121 >> 90-120 >> 90-120 >> n/c - 2h after lunch: 128-148 (1h after lunch) >> n/c >> 130s >> 120s >> n/c - dinner: 90-120 >> 100-120, 130 >> 130s >> 90s-130s >> n/c - 2h after dinner: 120, 130 >> 95-114 >> n/c>> 140  - No CKD, last BUN/creatinine:  Lab Results  Component Value Date   BUN 14 08/03/2016   CREATININE 0.85 08/03/2016  Last ACR: normal, 5.7 at last check, but had proteinuria after pregnancy 2010.  - + HL. Last lipids: Lab Results  Component Value Date   CHOL 180 08/03/2016   HDL 40.40 08/03/2016   LDLCALC 102 (H) 08/03/2016   LDLDIRECT 117.8 03/07/2013   TRIG 189.0 (H) 08/03/2016   CHOLHDL 4 08/03/2016  On Pravastatin 20 mg daily. - Last eye exam - 02/2013 >> No DR. She did not schedule another eye exam despite multiple promptings.  - She denies numbness and tingling in her feet.  Graves ds.: - Reviewed latest TFTs: Lab Results  Component Value Date   TSH 0.74 10/28/2016   TSH <0.01 (L) 08/03/2016   TSH <0.01 (L) 06/30/2016   TSH 0.21 (L) 02/11/2016   TSH 2.29 10/13/2015    FREET4 0.47 (L) 10/28/2016   FREET4 0.81 08/03/2016   FREET4 1.47 06/30/2016   FREET4 0.67 02/11/2016   FREET4 0.71 10/13/2015   I initially believed that she had subacute thyroiditis >> We started Toprol XL 25 mg >> now on 50 mg daily with persistent tachycardia, but the sxs persisted and her TFTs worsened >> we checked an uptake and scan that returned positive for Graves' disease. TSI is were also high.  Lab Results  Component Value Date   TSI 152 (H) 02/11/2016   TSI 188 (H) 12/18/2013   We started MMI 10 mg bid and advised her to decrease the MMI dose to 10 mg in am and 5 mg in pm 4 months ago. She did not do this >> continued10 mg bid >> TSH 38 >> stopped MMI >> then TSH 0.09 >> restarted 5 mg MMI daily, then increased to 2x a day 6 mo ago >> again, did not come back for labs... We decreased the methimazole to 2.5 mg daily in 08/2015 >> stopped it in 09/2015. However, we had to restart and then increase to 5 mg in am and 2.5 mg in pm >> at last visit, increased to 5 mg bid >> TFTs normalized.  She also has a history of PCOS, HL, HTN.  She has a h/o vitamin D def. >> was on Ergocalciferol 2 years ago >> now on a MVI + we added 4000 IU vit D3 at last visit >> now back on 50,000 IU weekly - just started (by PCP)  Lab Results  Component Value Date   VD25OH 19.29 (L) 08/03/2016   VD25OH 15.59 (L) 05/21/2014    ROS: Constitutional: + weight gain, no fatigue, no subjective hyperthermia, no subjective hypothermia Eyes: no blurry vision, no xerophthalmia ENT: no sore throat, no nodules palpated in throat, no dysphagia, no odynophagia, no hoarseness Cardiovascular: no CP/no SOB/+ occas. palpitations/no leg swelling Respiratory: no cough/no SOB/no wheezing Gastrointestinal: no N/no V/no D/no C/no acid reflux Musculoskeletal: no muscle aches/no joint aches Skin: no rashes, no hair loss Neurological: no tremors/no numbness/no tingling/no dizziness, + HAs  I reviewed pt's medications,  allergies, PMH, social hx, family hx, and changes were documented in the history of present illness. Otherwise, unchanged from my initial visit note.  PE: BP 128/88 (BP Location: Left Arm, Patient Position: Sitting)   Pulse 94   Ht 5\' 3"  (1.6 m)   Wt 211 lb (95.7 kg)   SpO2 98%   BMI 37.38 kg/m  Body mass index is 37.38 kg/m.  Wt Readings from Last 3 Encounters:  12/01/16 211 lb (95.7 kg)  09/30/16 209 lb (94.8 kg)  08/03/16 204 lb (92.5 kg)   Constitutional: overweight, in NAD Eyes: PERRLA, EOMI, no exophthalmos ENT: moist mucous membranes, no thyromegaly, no cervical lymphadenopathy Cardiovascular: tachycardia, RR, No MRG Respiratory: CTA B Gastrointestinal: abdomen soft, NT, ND, BS+ Musculoskeletal: no deformities, strength intact in all 4 Skin: moist, warm, no rashes Neurological: no tremor with outstretched hands, DTR normal in all 4   ASSESSMENT: 1. DM2, non-insulin-dependent, controlled, without complications  2. Graves ds - 03/12/2014: Thyroid uptake and scan: Consistent with Graves' disease: The 24 hr uptake by the thyroid gland is 65%. Normal 24 hr uptake is 10-30%. On thyroid imaging, the thyroid activity appears diffusely increased. No focal hot or cold nodules are identified.  3. Vitamin D def.  PLAN:  1. Patient with h/o fairly well controlled diabetes, on oral antidiabetic regimen, with metformin extended-release maximum dose: 1000 mg twice a day (switched from regular formulation due to GI issues). - reported sugars are at goal, but she started to drink sodas daily (at least 1x a day) >> today, HbA1c is 8.4% (much higher) >> strongly advised her to stop sodas/shakes/icecream - continue checking sugars at different times of the day - check 1x a day, rotating checks - advised for yearly eye exams >> she needs one! - Return to clinic in 4 mo with sugar log   2. Graves ds  - she was symptomatic at diagnosis with tachycardia, heat intolerance, tremors, and  weight loss. All of these have improved now. She continues on Toprol-XL 50 mg daily  - In the last year we had to restart methimazole, and we increased at last visit to 5 mg twice a day. She now continues on this dose.   last set of TFTs were normal 1 month ago.  - at last visit, we continued methimazole, but discussed about the possibility of needing RAI treatment in the future - will recheck her TFTs today. - Return to clinic in 3 months  Patient Instructions  Please stop at the lab.  Please continue Methimazole 5 mg in am and 5 mg daily.  Continue vitamin D 50,000 units weekly  Continue Metformin XR 1000 mg 2x  a day.  STOP SODAS!  Please return in 3 months with your sugar log.   Please call and schedule an eye appt with Dr. Randon GoldsmithLyles: [facility]:  Dr. Jeni SallesLyles Graham W. MD ?  Address: [address], [place], [place] [address]  [phone]   3. Vit D def - At last visit, vitamin D was still low, so we added 4000 units of vitamin D3 supplement. Her PCP just added Ergocalcferol 50,0000 IU daily  Needs refills MMI. Component     Latest Ref Rng & Units 12/01/2016  Hemoglobin A1C      8.4  TSH     0.35 - 4.50 uIU/mL 1.77  T4,Free(Direct)     0.60 - 1.60 ng/dL 8.110.74  Triiodothyronine,Free,Serum     2.3 - 4.2 pg/mL 3.3   TFTs normal. I would suggest to continue same MMi dose for now and recheck at next visit.  Carlus Pavlovristina Navie Lamoreaux, MD PhD [facility]

## 2016-12-01 NOTE — Patient Instructions (Addendum)
Please stop at the lab.  Please continue Methimazole 5 mg in am and 5 mg daily.  Continue vitamin D 50,000 units weekly  Continue Metformin XR 1000 mg 2x a day.  STOP SODAS!  Please return in 3 months with your sugar log.   Please call and schedule an eye appt with Dr. Randon GoldsmithLyles: [facility]:  Dr. Jeni SallesLyles Graham W. MD ?  Address: [address], [place], [place] [address]  [phone]

## 2017-01-21 ENCOUNTER — Other Ambulatory Visit: Payer: Self-pay | Admitting: Internal Medicine

## 2017-02-03 ENCOUNTER — Ambulatory Visit (INDEPENDENT_AMBULATORY_CARE_PROVIDER_SITE_OTHER): Payer: BLUE CROSS/BLUE SHIELD | Admitting: Internal Medicine

## 2017-02-03 ENCOUNTER — Encounter: Payer: Self-pay | Admitting: Internal Medicine

## 2017-02-03 VITALS — BP 136/94 | HR 89 | Temp 97.9°F | Ht 63.0 in | Wt 214.0 lb

## 2017-02-03 DIAGNOSIS — Z23 Encounter for immunization: Secondary | ICD-10-CM | POA: Diagnosis not present

## 2017-02-03 DIAGNOSIS — E119 Type 2 diabetes mellitus without complications: Secondary | ICD-10-CM | POA: Diagnosis not present

## 2017-02-03 DIAGNOSIS — E6609 Other obesity due to excess calories: Secondary | ICD-10-CM | POA: Diagnosis not present

## 2017-02-03 DIAGNOSIS — R030 Elevated blood-pressure reading, without diagnosis of hypertension: Secondary | ICD-10-CM

## 2017-02-03 MED ORDER — AMLODIPINE BESYLATE 5 MG PO TABS: 5.0000 mg | ORAL_TABLET | Freq: Every day | ORAL | 3 refills | Status: DC

## 2017-02-03 MED ORDER — EXENATIDE ER 2 MG ~~LOC~~ PEN: 2.0000 mg | PEN_INJECTOR | SUBCUTANEOUS | 11 refills | Status: DC

## 2017-02-03 NOTE — Progress Notes (Signed)
   Subjective:    Patient ID: Ana [name], female    DOB: 04/21/1980, 36 y.o.   MRN: [id number]  HPI The patient is a 36 YO female coming in for several concerns including elevated blood pressure (she has been having headaches at home as well, she is taking her metoprolol as she is supposed to, weight is increasing and diet is poor lately, not exercising, checked BP at home and BP 140s/100s or 90s), and her weight (increasing lately and her thyroid medicine has been the same, she is struggling with diet and exercise, has tried and failed belviq and not a candidate for phentermine) and her diabetes (last HgA1c 8.4 and she was asked to make dietary changes, willing to try glp-1 as her mom takes it and no problems, has a lot of cravings).   Review of Systems  Constitutional: Positive for activity change, appetite change and unexpected weight change. Negative for chills, diaphoresis, fatigue and fever.  HENT: Negative.   Eyes: Negative.   Respiratory: Negative for cough, chest tightness and shortness of breath.   Cardiovascular: Negative for chest pain, palpitations and leg swelling.  Gastrointestinal: Negative for abdominal distention, abdominal pain, constipation, diarrhea, nausea and vomiting.  Musculoskeletal: Negative.   Skin: Negative.   Neurological: Positive for dizziness and headaches. Negative for tremors, seizures, speech difficulty and weakness.  Psychiatric/Behavioral: Negative.       Objective:   Physical Exam  Constitutional: She is oriented to person, place, and time. She appears well-developed and well-nourished.  Overweight  HENT:  Head: Normocephalic and atraumatic.  Eyes: EOM are normal.  Neck: Normal range of motion.  Cardiovascular: Normal rate and regular rhythm.   Pulmonary/Chest: Effort normal and breath sounds normal. No respiratory distress. She has no wheezes. She has no rales.  Abdominal: Soft. Bowel sounds are normal. She exhibits no distension. There is  no tenderness. There is no rebound.  Musculoskeletal: She exhibits no edema.  Neurological: She is alert and oriented to person, place, and time. Coordination normal.  Skin: Skin is warm and dry.  Psychiatric: She has a normal mood and affect.   Vitals:   02/03/17 0827 02/03/17 0906  BP: (!) 138/96 (!) 136/94  Pulse: 89   Temp: 97.9 F (36.6 C)   TempSrc: Oral   SpO2: 98%   Weight: 214 lb (97.1 kg)   Height: 5\' 3"  (1.6 m)       Assessment & Plan:  Flu shot given at visit

## 2017-02-03 NOTE — Patient Instructions (Addendum)
We have sent in bydureon to start for the sugars and weight loss. Inject weekly and you should see some results in 1-2 months.   We have also sent in amlodipine 1 pill daily for the blood pressure and headaches.   Make small changes 1 time per month to work on diet gradually for lasting change.

## 2017-02-03 NOTE — Assessment & Plan Note (Signed)
Rx for amlodipine 5 mg daily for consistently elevated blood pressure while she is working on weight loss.

## 2017-02-03 NOTE — Assessment & Plan Note (Signed)
Add bydureon to help with HgA1c 8.4 and with her weight loss goals. Not complicated. Continue metformin.

## 2017-02-03 NOTE — Assessment & Plan Note (Signed)
Trulicity for diabetes and weight loss. Will see back in 3 months to assess her goal of 10 pound weight loss.

## 2017-02-21 ENCOUNTER — Other Ambulatory Visit: Payer: Self-pay | Admitting: Internal Medicine

## 2017-03-07 ENCOUNTER — Ambulatory Visit: Payer: BLUE CROSS/BLUE SHIELD | Admitting: Internal Medicine

## 2017-03-17 ENCOUNTER — Telehealth: Payer: Self-pay

## 2017-03-17 ENCOUNTER — Encounter: Payer: Self-pay | Admitting: Internal Medicine

## 2017-03-17 ENCOUNTER — Ambulatory Visit: Payer: BLUE CROSS/BLUE SHIELD | Admitting: Internal Medicine

## 2017-03-17 DIAGNOSIS — E6609 Other obesity due to excess calories: Secondary | ICD-10-CM | POA: Diagnosis not present

## 2017-03-17 DIAGNOSIS — E119 Type 2 diabetes mellitus without complications: Secondary | ICD-10-CM

## 2017-03-17 MED ORDER — PHENTERMINE-TOPIRAMATE ER 7.5-46 MG PO CP24: 1.0000 | ORAL_CAPSULE | Freq: Every day | ORAL | 1 refills | Status: DC

## 2017-03-17 MED ORDER — PHENTERMINE-TOPIRAMATE ER 3.75-23 MG PO CP24: 1.0000 | ORAL_CAPSULE | Freq: Every day | ORAL | 0 refills | Status: DC

## 2017-03-17 NOTE — Patient Instructions (Addendum)
We will have you try taking qysmia for weight loss. We have sent in 2 doses of it. Start with the 3.75/23 mg take 1 pill daily for 2 weeks. Then increase to the 7.5/46 mg take 1 pill daily. Come back in 1-2 months to see if this is working.

## 2017-03-17 NOTE — Telephone Encounter (Signed)
She told me that she did not want to take bydureon so yes stop.

## 2017-03-17 NOTE — Assessment & Plan Note (Signed)
Rx for qsymia today to replace bydureon.

## 2017-03-17 NOTE — Progress Notes (Signed)
   Subjective:    Patient ID: Ana [name], female    DOB: 06/29/1980, 36 y.o.   MRN: [id number]  HPI The patient is a 36 YO female coming in for follow up of starting bydureon. She is down about 5 pounds in 1.5 months but is having some itching after taking it. She gets some welts on her skin and local itching. Some lump under the skin which lasts for weeks. She hit a blood vessel the last time and got a bruise. She has noticed decrease in appetite. Also the price will be going up for bydureon.  Review of Systems  Constitutional: Negative.   Respiratory: Negative for cough, chest tightness and shortness of breath.   Cardiovascular: Negative for chest pain, palpitations and leg swelling.  Gastrointestinal: Negative for abdominal distention, abdominal pain, constipation, diarrhea, nausea and vomiting.  Musculoskeletal: Negative.   Skin: Positive for rash.  Neurological: Negative.   Psychiatric/Behavioral: Negative.       Objective:   Physical Exam  Constitutional: She is oriented to person, place, and time. She appears well-developed and well-nourished.  HENT:  Head: Normocephalic and atraumatic.  Eyes: EOM are normal.  Neck: Normal range of motion.  Cardiovascular: Normal rate and regular rhythm.  Pulmonary/Chest: Effort normal and breath sounds normal. No respiratory distress. She has no wheezes. She has no rales.  Abdominal: Soft.  Musculoskeletal: She exhibits no edema.  Neurological: She is alert and oriented to person, place, and time. Coordination normal.  Skin: Skin is warm and dry.  Bruise on the stomach circular about 3 cm diameter mild tenderness   Vitals:   03/17/17 0801  BP: 110/80  Pulse: 100  Temp: 98.5 F (36.9 C)  TempSrc: Oral  SpO2: 100%  Weight: 209 lb (94.8 kg)  Height: 5\' 3"  (1.6 m)      Assessment & Plan:

## 2017-03-17 NOTE — Assessment & Plan Note (Signed)
She wishes to stop bydureon and not try pre-medication with benadryl for local skin reaction. Rx for qsymia for weight loss. Stop topiramate

## 2017-03-17 NOTE — Telephone Encounter (Signed)
Patient was wondering if you wanted her to stop bydureon and start new medication or take the benadryl 30 min before taking bydureon to finish her last three pens and then start her new medication. Please advise

## 2017-03-17 NOTE — Telephone Encounter (Signed)
Patient informed. 

## 2017-03-20 ENCOUNTER — Other Ambulatory Visit: Payer: Self-pay | Admitting: Internal Medicine

## 2017-03-22 ENCOUNTER — Telehealth: Payer: Self-pay | Admitting: Internal Medicine

## 2017-03-22 NOTE — Telephone Encounter (Signed)
Copied from CRM #[id number]. >> Mar 22, 2017  1:14 PM Raquel SarnaHayes, Ana G wrote: Pt hasn't taken the medication Dr. Okey Duprerawford prescribed for her.  [facility] to insurance company.  [facility] [address]

## 2017-03-22 NOTE — Telephone Encounter (Signed)
See OV 03/17/17 Qsymia  [facility] pre[facility]

## 2017-03-31 NOTE — Telephone Encounter (Signed)
PA started on CoverMyMeds KEY: [id number]

## 2017-04-05 NOTE — Telephone Encounter (Signed)
PA approved 03/31/2017-09/26/2017 pharmacy notified

## 2017-04-06 ENCOUNTER — Encounter: Payer: Self-pay | Admitting: Internal Medicine

## 2017-04-06 ENCOUNTER — Ambulatory Visit: Payer: BLUE CROSS/BLUE SHIELD | Admitting: Internal Medicine

## 2017-04-06 VITALS — BP 104/60 | HR 105 | Ht 63.0 in | Wt 207.4 lb

## 2017-04-06 DIAGNOSIS — Z6836 Body mass index (BMI) 36.0-36.9, adult: Secondary | ICD-10-CM | POA: Diagnosis not present

## 2017-04-06 DIAGNOSIS — E119 Type 2 diabetes mellitus without complications: Secondary | ICD-10-CM

## 2017-04-06 DIAGNOSIS — E0500 Thyrotoxicosis with diffuse goiter without thyrotoxic crisis or storm: Secondary | ICD-10-CM | POA: Diagnosis not present

## 2017-04-06 LAB — POCT GLYCOSYLATED HEMOGLOBIN (HGB A1C): HEMOGLOBIN A1C: 6.4

## 2017-04-06 LAB — T4, FREE: Free T4: 0.56 ng/dL — ABNORMAL LOW (ref 0.60–1.60)

## 2017-04-06 LAB — TSH: TSH: 8.45 u[IU]/mL — AB (ref 0.35–4.50)

## 2017-04-06 LAB — T3, FREE: T3 FREE: 3.4 pg/mL (ref 2.3–4.2)

## 2017-04-06 MED ORDER — METFORMIN HCL ER 500 MG PO TB24: ORAL_TABLET | ORAL | 3 refills | Status: DC

## 2017-04-06 MED ORDER — EXENATIDE ER 2 MG ~~LOC~~ PEN: 2.0000 mg | PEN_INJECTOR | SUBCUTANEOUS | 11 refills | Status: DC

## 2017-04-06 MED ORDER — PRAVASTATIN SODIUM 20 MG PO TABS: 20.0000 mg | ORAL_TABLET | Freq: Every day | ORAL | 3 refills | Status: DC

## 2017-04-06 MED ORDER — METOPROLOL SUCCINATE ER 25 MG PO TB24: ORAL_TABLET | ORAL | 3 refills | Status: DC

## 2017-04-06 NOTE — Progress Notes (Addendum)
Long-termPatient ID: Ana [name], female   DOB: Jan 24, 1981, 36 y.o.   MRN: [id number]  HPI: Ana [name] is a 36 y.o.-year-old female, returning for f/u DM2, dx summer 2014, non-insulin-dependent, controlled, without complications and also Graves ds. Last visit 4 mo ago.  Before last visit, she started to drink sodas again >> HbA1c was higher. She stopped sodas since then.   Reviewed latest hemoglobin A1c was: Lab Results  Component Value Date   HGBA1C 8.4 12/01/2016   HGBA1C 6.7 08/03/2016   HGBA1C 6.3 02/11/2016  Previously 10.5% (10/2012).  Previously 6.5%.   Pt is on a regimen of: - Metformin XR 1000 mg 2x a day We had to stop Invokana 100 mg b/c repeated yeast inf. (09/26/2013) She was on JanuMet when she was prediabetic >> tolerated it well.   She has a One Touch Ultra meter.  Checks sugars 0-1x a day: - am:  91-113 >> 91-100 >> 110-111 > 90-100 - 2h after b'fast: 98-118 >> n/c - lunch:   90-120 >> 90-120 >> n/c - 2h after lunch:  n/c >> 130s >> 120s >> n/c - dinner 130s >> 90s-130s >> n/c - 2h after dinner:  95-114 >> n/c>> 140 >> n/c  - No CKD, last BUN/creatinine:  Lab Results  Component Value Date   BUN 14 08/03/2016   CREATININE 0.85 08/03/2016  Last ACR: normal in 07/2016; but had proteinuria after pregnancy 2010.  - + HL. Last lipids: Lab Results  Component Value Date   CHOL 180 08/03/2016   HDL 40.40 08/03/2016   LDLCALC 102 (H) 08/03/2016   LDLDIRECT 117.8 03/07/2013   TRIG 189.0 (H) 08/03/2016   CHOLHDL 4 08/03/2016  On pravastatin 20. - Last eye exam - 2014 >> No DR. She did not schedule another eye exam despite multiple promptings.  - no numbness and tingling in her feet.  Graves ds.: - Reviewed latest TFTs >> normal: Lab Results  Component Value Date   TSH 1.77 12/01/2016   TSH 0.74 10/28/2016   TSH <0.01 (L) 08/03/2016   TSH <0.01 (L) 06/30/2016   TSH 0.21 (L) 02/11/2016   FREET4 0.74 12/01/2016   FREET4 0.47 (L) 10/28/2016    FREET4 0.81 08/03/2016   FREET4 1.47 06/30/2016   FREET4 0.67 02/11/2016   TSIs were elevated >> Graves ds.: Lab Results  Component Value Date   TSI 152 (H) 02/11/2016   TSI 188 (H) 12/18/2013   We started MMI 10 mg bid and advised her to decrease the MMI dose to 10 mg in am and 5 mg in pm 4 months ago. She did not do this >> continued10 mg bid >> TSH 38 >> stopped MMI >> then TSH 0.09 >> restarted 5 mg MMI daily, then increased to 2x a day 6 mo ago >> again, did not come back for labs... We decreased the methimazole to 2.5 mg daily in 08/2015 >> stopped it in 09/2015. However, we had to restart and then increase to 5 mg in am and 2.5 mg in pm >> increased to 5 mg bid >> TFTs normalized >> she continues on this dose  She also has a history of PCOS, HTN.   She has a h/o vitamin D def. >> on Ergocalciferol 50,000 IU weekly per PCP  Levels reviewed: Lab Results  Component Value Date   VD25OH 19.29 (L) 08/03/2016   VD25OH 15.59 (L) 05/21/2014    ROS: Constitutional: + weight loss, no fatigue, no subjective hyperthermia, no subjective hypothermia  Eyes: no blurry vision, no xerophthalmia ENT: no sore throat, no nodules palpated in throat, no dysphagia, no odynophagia, no hoarseness Cardiovascular: no CP/no SOB/no palpitations/no leg swelling Respiratory: no cough/no SOB/no wheezing Gastrointestinal: no N/no V/no D/no C/no acid reflux Musculoskeletal: no muscle aches/no joint aches Skin: no rashes, no hair loss Neurological: no tremors/no numbness/no tingling/no dizziness  I reviewed pt's medications, allergies, PMH, social hx, family hx, and changes were documented in the history of present illness. Otherwise, unchanged from my initial visit note.  PE: BP 104/60   Pulse (!) 105   Ht 5\' 3"  (1.6 m)   Wt 207 lb 6.4 oz (94.1 kg)   SpO2 98%   BMI 36.74 kg/m  Body mass index is 36.74 kg/m.  Wt Readings from Last 3 Encounters:  04/06/17 207 lb 6.4 oz (94.1 kg)  03/17/17 209 lb  (94.8 kg)  02/03/17 214 lb (97.1 kg)   Constitutional: overweight, in NAD Eyes: PERRLA, EOMI, no exophthalmos ENT: moist mucous membranes, no thyromegaly, no cervical lymphadenopathy Cardiovascular: tachycardiaRR, No MRG Respiratory: CTA B Gastrointestinal: abdomen soft, NT, ND, BS+ Musculoskeletal: no deformities, strength intact in all 4 Skin: moist, warm, no rashes Neurological: no tremor with outstretched hands, DTR normal in all 4    ASSESSMENT: 1. DM2, non-insulin-dependent, uncontrolled, without complications  2. Graves ds - 03/12/2014: Thyroid uptake and scan: Consistent with Graves' disease: The 24 hr uptake by the thyroid gland is 65%. Normal 24 hr uptake is 10-30%. On thyroid imaging, the thyroid activity appears diffusely increased. No focal hot or cold nodules are identified.  3.  Obesity  PLAN:  1. Patient with h/o previously well-controlled diabetes, on oral antidiabetic regimen, with only metformin extended release, but with higher HbA1c at last visit, at 8.4%. -Since last visit, she was started on Bydureon by PCP more in an effort to help her lose weight >> still on this for 2 mo - today, HbA1c is 6.4% (MUCH better) - continue checking sugars at different times of the day - check 1x a day, rotating checks - advised for yearly eye exams >> she is NOT UTD - again advised to schedule - Return to clinic in 4 mo with sugar log   2. Graves ds  - She continues on methimazole 5 mg twice a day and Toprol-XL 50 mg daily and feels good on these doses.  She did lose 7 pounds in the last 2 months, which is intentional. -Reviewed together her last set of TFTs from 11/2016 and these were normal. - At last visits, sit, we continued methimazole, but discussed about the possibility of needing RAI treatment in the future - will recheck her TFTs today - Return to clinic in 4 months  Patient Instructions  Please stop at the lab.  Please continue Methimazole 5 mg twice a  day  Continue: - Metformin XR 1000 mg 2x a day. - Bydureon 2 mg weekly  Please return in 3 months with your sugar log.   Please call and schedule an eye appt with Dr. Randon GoldsmithLyles: [facility]:  Dr. Jeni SallesLyles Graham W. MD ?  Address: [address], [place], [place] [address]  [phone]  3.  Obesity -Working closely with PCP -Lost 7 pounds in the last 2 months -Phentermine is contraindicated for her -Failed Belviq long time ago -developed injection site rxn with Bydureon, now still on this for 2 mo -will start Q'symia - if covered  Needs refills MMI. Component     Latest Ref Rng &  Units 04/06/2017  Hemoglobin A1C      6.4  TSH     0.35 - 4.50 uIU/mL 8.45 (H)  T4,Free(Direct)     0.60 - 1.60 ng/dL 4.090.56 (L)  Triiodothyronine,Free,Serum     2.3 - 4.2 pg/mL 3.4  TSI     <140 % baseline 210 (H)   Mild hypothyroidism >> will decrease the dose of methimazole to 5 mg daily and recheck her TFTs in 1.5 months.  TSI slightly higher.   Ana Pavlovristina Xylon Croom, MD PhD [facility]

## 2017-04-06 NOTE — Patient Instructions (Signed)
Please stop at the lab.  Please continue Methimazole 5 mg twice a day  Continue: - Metformin XR 1000 mg 2x a day. - Bydureon 2 mg weekly  Please return in 4 months with your sugar log.   Please call and schedule an eye appt with Dr. Randon GoldsmithLyles: [facility]:  Dr. Jeni SallesLyles Graham W. MD ?  Address: [address], [place], [place] [address]  [phone]

## 2017-04-07 MED ORDER — METHIMAZOLE 5 MG PO TABS: 5.0000 mg | ORAL_TABLET | Freq: Every day | ORAL | 3 refills | Status: DC

## 2017-04-11 LAB — THYROID STIMULATING IMMUNOGLOBULIN: TSI: 210 %{baseline} — AB (ref <140)

## 2017-04-28 ENCOUNTER — Telehealth: Payer: Self-pay | Admitting: Internal Medicine

## 2017-04-28 NOTE — Telephone Encounter (Signed)
Copied from CRM [id number]. Topic: Quick Communication - See Telephone Encounter >> Apr 28, 2017 11:21 AM Guinevere FerrariMorris, Laylanie Kruczek E, NT wrote: CRM for notification. See Telephone encounter for: Pt is called and said pharmacy is still waiting for insurance authorization for medication Phentermine-Topiramate (QSYMIA) 3.75-23 MG CP24. [facility] [address]   04/28/17.

## 2017-05-03 ENCOUNTER — Ambulatory Visit: Payer: BLUE CROSS/BLUE SHIELD | Admitting: Internal Medicine

## 2017-05-03 NOTE — Telephone Encounter (Signed)
PA started on CoverMyMeds KEY: [id number]

## 2017-07-12 ENCOUNTER — Other Ambulatory Visit (INDEPENDENT_AMBULATORY_CARE_PROVIDER_SITE_OTHER): Payer: BLUE CROSS/BLUE SHIELD

## 2017-07-12 DIAGNOSIS — E0500 Thyrotoxicosis with diffuse goiter without thyrotoxic crisis or storm: Secondary | ICD-10-CM | POA: Diagnosis not present

## 2017-07-12 LAB — T4, FREE: Free T4: 0.68 ng/dL (ref 0.60–1.60)

## 2017-07-12 LAB — TSH: TSH: 5.04 u[IU]/mL — AB (ref 0.35–4.50)

## 2017-07-12 LAB — T3, FREE: T3 FREE: 3.5 pg/mL (ref 2.3–4.2)

## 2017-07-13 ENCOUNTER — Other Ambulatory Visit: Payer: Self-pay | Admitting: Internal Medicine

## 2017-07-13 MED ORDER — METHIMAZOLE 5 MG PO TABS: 2.5000 mg | ORAL_TABLET | Freq: Every day | ORAL | 3 refills | Status: DC

## 2017-08-02 ENCOUNTER — Encounter: Payer: Self-pay | Admitting: Internal Medicine

## 2017-08-02 ENCOUNTER — Ambulatory Visit: Payer: BLUE CROSS/BLUE SHIELD | Admitting: Internal Medicine

## 2017-08-02 VITALS — BP 122/70 | HR 113 | Ht 63.0 in | Wt 198.0 lb

## 2017-08-02 DIAGNOSIS — E119 Type 2 diabetes mellitus without complications: Secondary | ICD-10-CM | POA: Diagnosis not present

## 2017-08-02 DIAGNOSIS — E0500 Thyrotoxicosis with diffuse goiter without thyrotoxic crisis or storm: Secondary | ICD-10-CM

## 2017-08-02 DIAGNOSIS — Z6836 Body mass index (BMI) 36.0-36.9, adult: Secondary | ICD-10-CM

## 2017-08-02 LAB — POCT GLYCOSYLATED HEMOGLOBIN (HGB A1C): HEMOGLOBIN A1C: 5.4

## 2017-08-02 MED ORDER — METOPROLOL SUCCINATE ER 25 MG PO TB24: ORAL_TABLET | ORAL | 3 refills | Status: DC

## 2017-08-02 NOTE — Patient Instructions (Signed)
Please have thyroid labs in >1 week from now.  Please continue methimazole 2.5 mg daily  Continue: - Metformin ER 1000 mg twice a day with meals  Please return in 6 months with your sugar log.   Please call and schedule an eye appt with Dr. Randon GoldsmithLyles: [facility]:  Dr. Jeni SallesLyles Graham W. MD ?  Address: [address], [place], [place] [address]  [phone]

## 2017-08-02 NOTE — Progress Notes (Signed)
Long-termPatient ID: Ana ItoLashonda R [name], female   DOB: 07/08/1980, 37 y.o.   MRN: [id number]  HPI: Ana [name] is a 37 y.o.-year-old female, returning for f/u DM2, dx summer 2014, non-insulin-dependent, controlled, without long-term complications and also Graves ds. Last visit 4 months ago.  She had a higher HbA1c in 11/2016 due to restarting drinking sodas.  Since then, she stopped sodas   Reviewed latest hemoglobin A1c was: Lab Results  Component Value Date   HGBA1C 6.4 04/06/2017   HGBA1C 8.4 12/01/2016   HGBA1C 6.7 08/03/2016  Previously 10.5% (10/2012).  Previously 6.5%.   Pt is on a regimen of: - Metformin XR 1000 mg twice a day. Stopped Bydureon 1 mo ago We had to stop Invokana 100 mg b/c repeated yeast inf. (09/26/2013) She was on JanuMet when she was prediabetic >> tolerated it well.   She has a One Touch Ultra meter.  Checks sugars 0 to once a day: - am: 110-111 > 90-100 >> 91-99 - 2h after b'fast: 98-118 >> n/c - lunch:  90-120 >> n/c - 2h after lunch: 120s >> n/c - dinner  90s-130s >> n/c - 2h after dinner:140 >> up to 130 Highest: 130s  -No CKD, last BUN/creatinine:  Lab Results  Component Value Date   BUN 14 08/03/2016   CREATININE 0.85 08/03/2016  Last ACR: Normal in 07/2016; but had proteinuria after pregnancy 2010.  -+ HL. Last lipids: Lab Results  Component Value Date   CHOL 180 08/03/2016   HDL 40.40 08/03/2016   LDLCALC 102 (H) 08/03/2016   LDLDIRECT 117.8 03/07/2013   TRIG 189.0 (H) 08/03/2016   CHOLHDL 4 08/03/2016  On pravastatin 20. - Last eye exam -2014: No DR DR. She did not schedule another eye exam despite multiple promptings.  - No numbness and tingling in her feet.  Graves ds.: - Reviewed latest TFTs >> slightly high TSH 3 weeks ago, after which we decreased her methimazole dose: Lab Results  Component Value Date   TSH 5.04 (H) 07/12/2017   TSH 8.45 (H) 04/06/2017   TSH 1.77 12/01/2016   TSH 0.74 10/28/2016   TSH <0.01  (L) 08/03/2016   FREET4 0.68 07/12/2017   FREET4 0.56 (L) 04/06/2017   FREET4 0.74 12/01/2016   FREET4 0.47 (L) 10/28/2016   FREET4 0.81 08/03/2016   Her TSI antibodies  were still high at last check: Lab Results  Component Value Date   TSI 210 (H) 04/06/2017   TSI 152 (H) 02/11/2016   TSI 188 (H) 12/18/2013   We initially started methimazole 10 mg twice a day, and subsequently tapered it off and stopped it completely in 09/2015.  However, her TSH started to decrease and we had to restart methimazole in 07/2016.  Since then, we were again able to decrease the medication and she has been taking 2.5 mg daily since 06/2017.    She also has a history of PCO S, HTN.  She has a history of vitamin D deficiency and is on ergocalciferol per PCP.  Levels reviewed: Lab Results  Component Value Date   VD25OH 19.29 (L) 08/03/2016   VD25OH 15.59 (L) 05/21/2014   ROS: Constitutional: no weight gain/no weight loss, no fatigue, no subjective hyperthermia, no subjective hypothermia Eyes: no blurry vision, no xerophthalmia ENT: no sore throat, no nodules palpated in throat, no dysphagia, no odynophagia, no hoarseness Cardiovascular: no CP/no SOB/no palpitations/no leg swelling Respiratory: no cough/no SOB/no wheezing Gastrointestinal: no N/no V/no D/no C/no acid reflux Musculoskeletal: no muscle  aches/no joint aches Skin: no rashes, no hair loss Neurological: no tremors/no numbness/no tingling/no dizziness  I reviewed pt's medications, allergies, PMH, social hx, family hx, and changes were documented in the history of present illness. Otherwise, unchanged from my initial visit note.  PE: BP 122/70   Pulse (!) 113   Ht 5\' 3"  (1.6 m)   Wt 198 lb (89.8 kg)   SpO2 99%   BMI 35.07 kg/m  Body mass index is 35.07 kg/m.  Wt Readings from Last 3 Encounters:  08/02/17 198 lb (89.8 kg)  04/06/17 207 lb 6.4 oz (94.1 kg)  03/17/17 209 lb (94.8 kg)   Constitutional: overweight, in NAD Eyes:  PERRLA, EOMI, no exophthalmos ENT: moist mucous membranes, no thyromegaly, no cervical lymphadenopathy Cardiovascular: tachycardiaRR, No MRG Respiratory: CTA B Gastrointestinal: abdomen soft, NT, ND, BS+ Musculoskeletal: no deformities, strength intact in all 4 Skin: moist, warm, no rashes Neurological: no tremor with outstretched hands, DTR normal in all 4  ASSESSMENT: 1. DM2, non-insulin-dependent, uncontrolled, without complications  2. Graves ds - 03/12/2014: Thyroid uptake and scan: Consistent with Graves' disease: The 24 hr uptake by the thyroid gland is 65%. Normal 24 hr uptake is 10-30%. On thyroid imaging, the thyroid activity appears diffusely increased. No focal hot or cold nodules are identified.  3.  Obesity  PLAN:  1. Patient with previously well-controlled diabetes, on oral antidiabetic regimen and GLP-1 receptor agonist added mostly for weight loss, but also for diabetes control, after an HbA1c returned high, at 8.4% in fall 2018 - sugars are at goal now - today, HbA1c is 5.4% (Great!)  - continue checking sugars at different times of the day - check 1x a day, rotating checks - advised for yearly eye exams >> she is not UTD - Return to clinic in 3 mo with sugar log   2. Graves ds  -At last visit, we started to decrease her methimazole dose, currently on 2.5 mg daily for the last 3 weeks.  No side effects from methimazole.  In the past, she was able to come off methimazole, but her Graves' disease recurred.  For now, we plan to stay on the medication for longer, at the minimum dose. -She continues on Toprol-XL 50 mg daily -reviewed together her previous TFTs and will recheck another set in 1-4 weeks from now -We did discuss at last visit about the potential need for RAI treatment, but  due to recent decrease in methimazole requirements, I do not feel this is absolutely necessary for now - Return to clinic in 4 months  Patient Instructions  Please have thyroid labs  in >1 week from now.  Please continue methimazole 2.5 mg daily  Continue: - Metformin ER 1000 mg twice a day with meals  Please return in 6 months with your sugar log.   Please call and schedule an eye appt with Dr. Randon Goldsmith: [facility]:  Dr. Jeni Salles MD ?  Address: [address] [place], [place], [place] [address]  [phone]  3.  Obesity -Working closely with PCP -started Qsymia >> lost 9 lbs since last visit! -Failed Belviq -Developed injection site reactions with Bydureon in the past   Carlus Pavlov, MD PhD [facility]

## 2017-08-11 ENCOUNTER — Other Ambulatory Visit: Payer: Self-pay | Admitting: Internal Medicine

## 2017-08-21 ENCOUNTER — Other Ambulatory Visit: Payer: Self-pay

## 2017-08-21 MED ORDER — METOPROLOL SUCCINATE ER 50 MG PO TB24: 50.0000 mg | ORAL_TABLET | Freq: Every day | ORAL | 1 refills | Status: DC

## 2017-09-03 ENCOUNTER — Other Ambulatory Visit: Payer: Self-pay | Admitting: Internal Medicine

## 2017-10-20 ENCOUNTER — Other Ambulatory Visit: Payer: Self-pay | Admitting: Internal Medicine

## 2017-10-20 DIAGNOSIS — E559 Vitamin D deficiency, unspecified: Secondary | ICD-10-CM

## 2017-12-11 ENCOUNTER — Other Ambulatory Visit: Payer: Self-pay | Admitting: Internal Medicine

## 2017-12-11 ENCOUNTER — Other Ambulatory Visit (INDEPENDENT_AMBULATORY_CARE_PROVIDER_SITE_OTHER): Payer: BLUE CROSS/BLUE SHIELD

## 2017-12-11 ENCOUNTER — Encounter: Payer: Self-pay | Admitting: Internal Medicine

## 2017-12-11 ENCOUNTER — Ambulatory Visit: Payer: BLUE CROSS/BLUE SHIELD | Admitting: Internal Medicine

## 2017-12-11 VITALS — BP 120/84 | HR 94 | Temp 98.0°F | Ht 63.0 in | Wt 206.0 lb

## 2017-12-11 DIAGNOSIS — Z01419 Encounter for gynecological examination (general) (routine) without abnormal findings: Secondary | ICD-10-CM | POA: Diagnosis not present

## 2017-12-11 DIAGNOSIS — E119 Type 2 diabetes mellitus without complications: Secondary | ICD-10-CM

## 2017-12-11 DIAGNOSIS — E0500 Thyrotoxicosis with diffuse goiter without thyrotoxic crisis or storm: Secondary | ICD-10-CM | POA: Diagnosis not present

## 2017-12-11 DIAGNOSIS — E1169 Type 2 diabetes mellitus with other specified complication: Secondary | ICD-10-CM | POA: Diagnosis not present

## 2017-12-11 DIAGNOSIS — Z6837 Body mass index (BMI) 37.0-37.9, adult: Secondary | ICD-10-CM | POA: Diagnosis not present

## 2017-12-11 DIAGNOSIS — E785 Hyperlipidemia, unspecified: Secondary | ICD-10-CM

## 2017-12-11 LAB — HEMOGLOBIN A1C: HEMOGLOBIN A1C: 7.1 % — AB (ref 4.6–6.5)

## 2017-12-11 LAB — COMPREHENSIVE METABOLIC PANEL
ALK PHOS: 70 U/L (ref 39–117)
ALT: 10 U/L (ref 0–35)
AST: 13 U/L (ref 0–37)
Albumin: 4.2 g/dL (ref 3.5–5.2)
BUN: 13 mg/dL (ref 6–23)
CO2: 24 mEq/L (ref 19–32)
Calcium: 9.9 mg/dL (ref 8.4–10.5)
Chloride: 103 mEq/L (ref 96–112)
Creatinine, Ser: 1.05 mg/dL (ref 0.40–1.20)
GFR: 75.38 mL/min (ref >60.00)
GLUCOSE: 130 mg/dL — AB (ref 70–99)
POTASSIUM: 4.1 meq/L (ref 3.5–5.1)
SODIUM: 136 meq/L (ref 135–145)
Total Bilirubin: 0.3 mg/dL (ref 0.2–1.2)
Total Protein: 8.1 g/dL (ref 6.0–8.3)

## 2017-12-11 LAB — LIPID PANEL
Cholesterol: 213 mg/dL — ABNORMAL HIGH (ref 0–200)
HDL: 44.1 mg/dL (ref >39.00)
LDL CALC: 132 mg/dL — AB (ref 0–99)
NONHDL: 169.14
Total CHOL/HDL Ratio: 5
Triglycerides: 186 mg/dL — ABNORMAL HIGH (ref 0.0–149.0)
VLDL: 37.2 mg/dL (ref 0.0–40.0)

## 2017-12-11 LAB — T4, FREE: Free T4: 0.64 ng/dL (ref 0.60–1.60)

## 2017-12-11 LAB — MICROALBUMIN / CREATININE URINE RATIO
Creatinine,U: 131.2 mg/dL
MICROALB/CREAT RATIO: 3.4 mg/g (ref 0.0–30.0)
Microalb, Ur: 4.5 mg/dL — ABNORMAL HIGH (ref 0.0–1.9)

## 2017-12-11 LAB — T3, FREE: T3, Free: 4.1 pg/mL (ref 2.3–4.2)

## 2017-12-11 LAB — TSH: TSH: 3.8 u[IU]/mL (ref 0.35–4.50)

## 2017-12-11 MED ORDER — TOPIRAMATE 25 MG PO TABS: 50.0000 mg | ORAL_TABLET | Freq: Every day | ORAL | 11 refills | Status: DC

## 2017-12-11 NOTE — Patient Instructions (Signed)
We will check the labs today and work on the prior authorization.   We have sent in the prescription for the topamax by itself for the headaches until you get back on the qsymia.

## 2017-12-11 NOTE — Assessment & Plan Note (Signed)
BMI 36.5 and complications by diabetes, hyperlipidemia. Refill qsymia as needed until follow up in 3-6 months as it is effective. Needs PA done. She has tried and failed belviq, contrave, and bydureon. If she cannot get qsymia she is willing to try bydureon again.

## 2017-12-11 NOTE — Assessment & Plan Note (Signed)
Checking lipid panel and adjust pravastatin as needed for LDL goal <70.  

## 2017-12-11 NOTE — Progress Notes (Signed)
   Subjective:    Patient ID: Karl ItoLashonda R Highley, female    DOB: 11/12/1980, 37 y.o.   MRN: [id number]  HPI The patient is a 37 YO female coming in for follow up of her weight (was using bydureon and thought is was giving her allergic reaction and stopped about 9 months ago, was given rx for qsymia and took that for about 2 months as this was all insurance approved with PA, weight was going down on the qsymia but now she is out), diabetes (taking metformin and bydureon, last HgA1c April 5.4, denies new numbness or tingling in arms or legs, is getting more cravings for sugars and drinking more sodas again and usually this means that her HgA1c is up) and her cholesterol (taking pravastatin and denies side effects, denies muscle pains, denies chest pains or SOB or numbness or weakness).   Review of Systems  Constitutional: Negative.   HENT: Negative.   Eyes: Negative.   Respiratory: Negative for cough, chest tightness and shortness of breath.   Cardiovascular: Negative for chest pain, palpitations and leg swelling.  Gastrointestinal: Negative for abdominal distention, abdominal pain, constipation, diarrhea, nausea and vomiting.  Musculoskeletal: Negative.   Skin: Negative.   Neurological: Negative.   Psychiatric/Behavioral: Negative.       Objective:   Physical Exam  Constitutional: She is oriented to person, place, and time. She appears well-developed and well-nourished.  HENT:  Head: Normocephalic and atraumatic.  Eyes: EOM are normal.  Neck: Normal range of motion.  Cardiovascular: Normal rate and regular rhythm.  Pulmonary/Chest: Effort normal and breath sounds normal. No respiratory distress. She has no wheezes. She has no rales.  Abdominal: Soft. Bowel sounds are normal. She exhibits no distension. There is no tenderness. There is no rebound.  Musculoskeletal: She exhibits no edema.  Neurological: She is alert and oriented to person, place, and time. Coordination normal.  Skin: Skin  is warm and dry.  Psychiatric: She has a normal mood and affect.   Vitals:   12/11/17 0843  BP: 120/84  Pulse: 94  Temp: 98 F (36.7 C)  TempSrc: Oral  SpO2: 99%  Weight: 206 lb (93.4 kg)  Height: 5\' 3"  (1.6 m)      Assessment & Plan:

## 2017-12-11 NOTE — Assessment & Plan Note (Signed)
Checking HgA1c and adjust metformin 1000 mg BID as needed. She has had more cravings and more sugar in diet recently. Last HgA1c 5.4.

## 2017-12-12 ENCOUNTER — Telehealth: Payer: Self-pay

## 2017-12-12 NOTE — Telephone Encounter (Signed)
Copied from CRM [phone]. Topic: General - Other >> Dec 12, 2017  8:51 AM Tamela OddiHarris, Brenda J wrote: Reason for CRM: Judeth CornfieldStephanie from [facility] called to speak with the doctor or nurse in regards to a continuation form.  The wrong portion of the form was filled.  Needs to be corrected.  Please advise and call back at #[phone].

## 2017-12-12 NOTE — Telephone Encounter (Signed)
LVM for stephanie to fax me the correct form so that I can make sure I have the right form and that I fill it out correctly and to call back if she needs me

## 2017-12-13 NOTE — Telephone Encounter (Signed)
Fax[facility] form back to [facility]

## 2018-01-27 ENCOUNTER — Other Ambulatory Visit: Payer: Self-pay | Admitting: Internal Medicine

## 2018-01-30 ENCOUNTER — Ambulatory Visit (INDEPENDENT_AMBULATORY_CARE_PROVIDER_SITE_OTHER): Payer: BLUE CROSS/BLUE SHIELD | Admitting: Internal Medicine

## 2018-01-30 ENCOUNTER — Encounter: Payer: Self-pay | Admitting: Internal Medicine

## 2018-01-30 VITALS — BP 126/80 | HR 107 | Ht 63.0 in | Wt 209.0 lb

## 2018-01-30 DIAGNOSIS — E119 Type 2 diabetes mellitus without complications: Secondary | ICD-10-CM

## 2018-01-30 DIAGNOSIS — E1169 Type 2 diabetes mellitus with other specified complication: Secondary | ICD-10-CM | POA: Diagnosis not present

## 2018-01-30 DIAGNOSIS — Z23 Encounter for immunization: Secondary | ICD-10-CM

## 2018-01-30 DIAGNOSIS — E0500 Thyrotoxicosis with diffuse goiter without thyrotoxic crisis or storm: Secondary | ICD-10-CM | POA: Diagnosis not present

## 2018-01-30 DIAGNOSIS — E785 Hyperlipidemia, unspecified: Secondary | ICD-10-CM

## 2018-01-30 LAB — T4, FREE: FREE T4: 0.62 ng/dL (ref 0.60–1.60)

## 2018-01-30 LAB — TSH: TSH: 1.98 u[IU]/mL (ref 0.35–4.50)

## 2018-01-30 LAB — T3, FREE: T3, Free: 3.5 pg/mL (ref 2.3–4.2)

## 2018-01-30 MED ORDER — METOPROLOL SUCCINATE ER 50 MG PO TB24: 50.0000 mg | ORAL_TABLET | Freq: Every day | ORAL | 1 refills | Status: DC

## 2018-01-30 MED ORDER — SEMAGLUTIDE(0.25 OR 0.5MG/DOS) 2 MG/1.5ML ~~LOC~~ SOPN: 0.5000 mg | PEN_INJECTOR | SUBCUTANEOUS | 5 refills | Status: DC

## 2018-01-30 NOTE — Progress Notes (Addendum)
Long-termPatient ID: [name], female   DOB: July 16, 1980, 37 y.o.   MRN: [id number]  HPI: Ana [name] is a 37 y.o.-year-old female, returning for f/u DM2, dx summer 2014, non-insulin-dependent, controlled, without long-term complications and also Graves ds. Last visit 6 months ago.   Reviewed latest hemoglobin A1c was: Lab Results  Component Value Date   HGBA1C 7.1 (H) 12/11/2017   HGBA1C 5.4 08/02/2017   HGBA1C 6.4 04/06/2017  Previously 10.5% (10/2012).  Previously 6.5%.   Pt is on a regimen of: - Metformin XR 1000 mg twice a day. Stopped Bydureon before last visit. We had to stop Invokana 100 mg b/c repeated yeast inf. (09/26/2013) She was on JanuMet when she was prediabetic >> tolerated it well.   She has a One Touch Ultra meter.  Checks sugars 0 to once a day: - am: 110-111 > 90-100 >> 91-99 >> 93-95 - 2h after b'fast: 98-118 >> n/c - lunch:  90-120 >> n/c - 2h after lunch: 120s >> n/c >> 150-160 - dinner  90s-130s >> n/c - 2h after dinner:140 >> up to 130 >> 150-160 Highest: 130s >> 160  -No CKD, last BUN/creatinine:  Lab Results  Component Value Date   BUN 13 12/11/2017   CREATININE 1.05 12/11/2017  Last ACR: Normal in 07/2016; but had proteinuria after pregnancy 2010.  -+ HL. Last lipids: Lab Results  Component Value Date   CHOL 213 (H) 12/11/2017   HDL 44.10 12/11/2017   LDLCALC 132 (H) 12/11/2017   LDLDIRECT 117.8 03/07/2013   TRIG 186.0 (H) 12/11/2017   CHOLHDL 5 12/11/2017  On pravastatin. - Last eye exam 2014: No DR she did not schedule another eye exam despite multiple promptings.  -Denies numbness and tingling in her feet.  Graves ds.: -Reviewed latest TFTs: TSH was still -slightly high on 2.5 mg methimazole daily. Lab Results  Component Value Date   TSH 3.80 12/11/2017   TSH 5.04 (H) 07/12/2017   TSH 8.45 (H) 04/06/2017   TSH 1.77 12/01/2016   TSH 0.74 10/28/2016   FREET4 0.64 12/11/2017   FREET4 0.68 07/12/2017   FREET4  0.56 (L) 04/06/2017   FREET4 0.74 12/01/2016   FREET4 0.47 (L) 10/28/2016   TSI antibodies were still high at last check: Lab Results  Component Value Date   TSI 210 (H) 04/06/2017   TSI 152 (H) 02/11/2016   TSI 188 (H) 12/18/2013   We initially started methimazole 10 mg twice a day, and subsequently tapered it off and stopped it completely in 09/2015.  However, her TSH started to decrease and we had to restart methimazole in 07/2016.  Since then, we were again able to decrease the medication and she has been taking 2.5 mg daily since 06/2017.  In 11/2017 we were able to stop methimazole completely.  She also has a history of PCOS, HTN.  She has a history of vitamin D deficiency and is on ergocalciferol per PCP  Levels reviewed: Lab Results  Component Value Date   VD25OH 19.29 (L) 08/03/2016   VD25OH 15.59 (L) 05/21/2014   ROS: Constitutional: no weight gain/no weight loss, no fatigue, no subjective hyperthermia, no subjective hypothermia Eyes: no blurry vision, no xerophthalmia ENT: no sore throat, no nodules palpated in throat, no dysphagia, no odynophagia, no hoarseness Cardiovascular: no CP/no SOB/no palpitations/no leg swelling Respiratory: no cough/no SOB/no wheezing Gastrointestinal: no N/no V/no D/no C/no acid reflux Musculoskeletal: no muscle aches/no joint aches Skin: no rashes, no hair loss Neurological: no tremors/no numbness/no  tingling/no dizziness  I reviewed pt's medications, allergies, PMH, social hx, family hx, and changes were documented in the history of present illness. Otherwise, unchanged from my initial visit note.  Past Medical History:  Diagnosis Date  . DM2 (diabetes mellitus, type 2) (HCC) 01/2013 dx  . Hypertension   . Other and unspecified hyperlipidemia   . PCOS (polycystic ovarian syndrome)    Past Surgical History:  Procedure Laterality Date  . CESAREAN SECTION  2010   Social History   Socioeconomic History  . Marital status: Married     Spouse name: Not on file  . Number of children: Not on file  . Years of education: Not on file  . Highest education level: Not on file  Occupational History  . Not on file  Social Needs  . Financial resource strain: Not on file  . Food insecurity:    Worry: Not on file    Inability: Not on file  . Transportation needs:    Medical: Not on file    Non-medical: Not on file  Tobacco Use  . Smoking status: Never Smoker  . Smokeless tobacco: Never Used  Substance and Sexual Activity  . Alcohol use: No  . Drug use: No  . Sexual activity: Yes    Partners: Male  Lifestyle  . Physical activity:    Days per week: Not on file    Minutes per session: Not on file  . Stress: Not on file  Relationships  . Social connections:    Talks on phone: Not on file    Gets together: Not on file    Attends religious service: Not on file    Active member of club or organization: Not on file    Attends meetings of clubs or organizations: Not on file    Relationship status: Not on file  . Intimate partner violence:    Fear of current or ex partner: Not on file    Emotionally abused: Not on file    Physically abused: Not on file    Forced sexual activity: Not on file  Other Topics Concern  . Not on file  Social History Narrative   Regular exercise: no   Caffeine use: 2 soda's daily   Lives with spouse and son   Works at [employer] as Psychologist, occupational   Current Outpatient Medications on File Prior to Visit  Medication Sig Dispense Refill  . amLODipine (NORVASC) 5 MG tablet TAKE 1 TABLET BY MOUTH EVERY DAY 90 tablet 3  . glucose blood (ONE TOUCH ULTRA TEST) test strip     . glucose blood test strip Use 2 times day as instructed - One Touch Ultra 100 each 11  . hydrOXYzine (ATARAX/VISTARIL) 10 MG tablet   0  . levonorgestrel (MIRENA) 20 MCG/24HR IUD 1 Intra Uterine Device (1 each total) by Intrauterine route once. Placed 07/19/13 - gyn 1 each 0  . metFORMIN (GLUCOPHAGE-XR) 500 MG 24 hr tablet TAKE TWO TABLETS  BY MOUTH TWICE A DAY WITH MEALS 360 tablet 3  . metoprolol succinate (TOPROL XL) 50 MG 24 hr tablet Take 1 tablet (50 mg total) by mouth daily. Take with or immediately following a meal. 90 tablet 1  . pravastatin (PRAVACHOL) 20 MG tablet Take 1 tablet (20 mg total) by mouth at bedtime. 90 tablet 3  . QSYMIA 7.5-46 MG CP24 TAKE 1 CAPSULE BY MOUTH EVERY DAY 30 capsule 3  . topiramate (TOPAMAX) 25 MG tablet Take 2 tablets (50 mg total) by mouth daily.  60 tablet 11  . Cholecalciferol (VITAMIN D3) 50000 units CAPS Take 1 capsule by mouth once a week. (Patient not taking: Reported on 01/30/2018) 24 capsule 1   No current facility-administered medications on file prior to visit.    Allergies  Allergen Reactions  . Invokana [Canagliflozin]     Recurrent yeast infections   Family History  Problem Relation Age of Onset  . Diabetes Mother        Type II  . Thyroid disease Mother   . Hypertension Mother   . Hyperlipidemia Mother   . Diabetes Father        TYPE II  . Hypertension Father   . Hyperlipidemia Father   . Heart disease Father        CAD with stent  . Hyperlipidemia Maternal Grandmother   . Hypertension Maternal Grandmother   . Obesity Maternal Grandmother   . Diabetes Maternal Grandmother   . Cancer Paternal Grandmother 39       colon cancer  . Diabetes Paternal Grandmother    PE: BP 126/80   Pulse (!) 107   Ht 5\' 3"  (1.6 m) Comment: measured  Wt 209 lb (94.8 kg)   SpO2 98%   BMI 37.02 kg/m  Body mass index is 37.02 kg/m.  Wt Readings from Last 3 Encounters:  01/30/18 209 lb (94.8 kg)  12/11/17 206 lb (93.4 kg)  08/02/17 198 lb (89.8 kg)   Constitutional: overweight, in NAD Eyes: PERRLA, EOMI, no exophthalmos ENT: moist mucous membranes, no thyromegaly, no cervical lymphadenopathy Cardiovascular: Tachycardia RR, No MRG Respiratory: CTA B Gastrointestinal: abdomen soft, NT, ND, BS+ Musculoskeletal: no deformities, strength intact in all 4 Skin: moist, warm, no  rashes Neurological: no tremor with outstretched hands, DTR normal in all 4  ASSESSMENT: 1. DM2, non-insulin-dependent, uncontrolled, without complications  2. Graves ds - 03/12/2014: Thyroid uptake and scan: Consistent with Graves' disease: The 24 hr uptake by the thyroid gland is 65%. Normal 24 hr uptake is 10-30%. On thyroid imaging, the thyroid activity appears diffusely increased. No focal hot or cold nodules are identified.  3.  Hyperlipidemia  4. Obesity  PLAN:  1. Patient with well-controlled diabetes, on oral antidiabetic regimen only.  Her latest HbA1c was 5.4% at last visit.  We did not change her regimen at that time.  She continues only on metformin.  Latest HbA1c was from 1.5 months ago and this was higher, at 7.1%. - sugars at home are a little higher than before, later in the day, with still CBGs at goal in am - we discussed about starting back a GLP1 R agonist >> will try Ozempic - continue checking sugars at different times of the day - check 1x a day, rotating checks - advised for yearly eye exams >> she is still not UTD - Return to clinic in  4 mo with sugar log   2. Graves ds  -Previously on methimazole 2.5 mg daily without side effects.  In the past she was able to come off methimazole but her Graves' disease recurred.  However, after last check, we were able to stop the methimazole again in 11/2017 -She continues Toprol-XL 50 mg daily -We will recheck her TFTs today.  If we need to restart methimazole, will stay on the low dose for a longer time. -No need for RAI treatment or surgery for her Graves' disease - Return to clinic in 6 months  Patient Instructions  Please continue off methimazole.  Continue: - Metformin ER 1000 mg  2x a day with meals  Please start Ozempic 0.25 mg weekly in a.m. (for example on Sunday morning) x 2 weeks, then increase to 0.5 mg weekly in a.m. if no nausea or hypoglycemia.  Please return in 4 months with your sugar log.    Please call and schedule an eye appt with Dr. Randon Goldsmith: [facility]:  Dr. Jeni Salles MD ?  Address: [address] [place], [place], [place] [address]  [phone]  3.  Hyperlipidemia - Reviewed latest lipid panel from 11/2017: LDL was higher, above goal, she also has high triglycerides and high total cholesterol Lab Results  Component Value Date   CHOL 213 (H) 12/11/2017   HDL 44.10 12/11/2017   LDLCALC 132 (H) 12/11/2017   LDLDIRECT 117.8 03/07/2013   TRIG 186.0 (H) 12/11/2017   CHOLHDL 5 12/11/2017  - Continues pravastatin 20 without side effects. We may need an increase in dose if Ozempic does not help with this.  4.  Obesity -Working closely with PCP -Failed Belviq -Developed injection site reaction to Bydureon in the past -Was Doing well on Qsymia >> but not approved - will start GLP1 R agonist   Component     Latest Ref Rng & Units 01/30/2018  TSH     0.35 - 4.50 uIU/mL 1.98  T4,Free(Direct)     0.60 - 1.60 ng/dL 8.11  Triiodothyronine,Free,Serum     2.3 - 4.2 pg/mL 3.5     Carlus Pavlov, MD PhD [facility]

## 2018-01-30 NOTE — Patient Instructions (Addendum)
Please continue off methimazole.  Continue: - Metformin ER 1000 mg 2x a day with meals  Please start Ozempic 0.25 mg weekly in a.m. (for example on Sunday morning) x 2 weeks, then increase to 0.5 mg weekly in a.m. if no nausea or hypoglycemia.  Please return in 4 months with your sugar log.   Please call and schedule an eye appt with Dr. Randon Goldsmith: [facility]:  Dr. Jeni Salles MD ?  Address: [address] [place], [place], [place] [address]  [phone]

## 2018-02-01 LAB — THYROID STIMULATING IMMUNOGLOBULIN: TSI: <89 % baseline (ref <140)

## 2018-03-24 ENCOUNTER — Other Ambulatory Visit: Payer: Self-pay | Admitting: Internal Medicine

## 2018-04-23 ENCOUNTER — Other Ambulatory Visit: Payer: Self-pay | Admitting: Internal Medicine

## 2018-04-23 ENCOUNTER — Encounter: Payer: Self-pay | Admitting: Internal Medicine

## 2018-04-28 ENCOUNTER — Other Ambulatory Visit: Payer: Self-pay | Admitting: Internal Medicine

## 2018-05-07 DIAGNOSIS — E119 Type 2 diabetes mellitus without complications: Secondary | ICD-10-CM | POA: Diagnosis not present

## 2018-06-05 ENCOUNTER — Ambulatory Visit: Payer: BLUE CROSS/BLUE SHIELD | Admitting: Internal Medicine

## 2018-06-05 ENCOUNTER — Encounter: Payer: Self-pay | Admitting: Internal Medicine

## 2018-06-05 VITALS — BP 110/80 | HR 106 | Ht 63.0 in | Wt 210.0 lb

## 2018-06-05 DIAGNOSIS — E0500 Thyrotoxicosis with diffuse goiter without thyrotoxic crisis or storm: Secondary | ICD-10-CM | POA: Diagnosis not present

## 2018-06-05 DIAGNOSIS — E785 Hyperlipidemia, unspecified: Secondary | ICD-10-CM | POA: Diagnosis not present

## 2018-06-05 DIAGNOSIS — E1169 Type 2 diabetes mellitus with other specified complication: Secondary | ICD-10-CM | POA: Diagnosis not present

## 2018-06-05 DIAGNOSIS — E119 Type 2 diabetes mellitus without complications: Secondary | ICD-10-CM | POA: Diagnosis not present

## 2018-06-05 LAB — LIPID PANEL
Cholesterol: 181 mg/dL (ref 0–200)
HDL: 40.2 mg/dL (ref >39.00)
NonHDL: 140.66
Total CHOL/HDL Ratio: 4
Triglycerides: 260 mg/dL — ABNORMAL HIGH (ref 0.0–149.0)
VLDL: 52 mg/dL — ABNORMAL HIGH (ref 0.0–40.0)

## 2018-06-05 LAB — POCT GLYCOSYLATED HEMOGLOBIN (HGB A1C): HEMOGLOBIN A1C: 6.8 % — AB (ref 4.0–5.6)

## 2018-06-05 LAB — TSH: TSH: 1.19 u[IU]/mL (ref 0.35–4.50)

## 2018-06-05 LAB — T4, FREE: FREE T4: 0.75 ng/dL (ref 0.60–1.60)

## 2018-06-05 LAB — T3, FREE: T3 FREE: 3.2 pg/mL (ref 2.3–4.2)

## 2018-06-05 LAB — LDL CHOLESTEROL, DIRECT: LDL DIRECT: 97 mg/dL

## 2018-06-05 NOTE — Progress Notes (Signed)
Long-termPatient ID: Ana [name], female   DOB: Jul 28, 1980, 38 y.o.   MRN: [id number]  HPI: Ana [name] is a 38 y.o.-year-old female, returning for f/u DM2, dx summer 2014, non-insulin-dependent, controlled, without long-term complications and also Graves ds. Last visit 4 months ago.   Reviewed latest hemoglobin A1c was: Lab Results  Component Value Date   HGBA1C 7.1 (H) 12/11/2017   HGBA1C 5.4 08/02/2017   HGBA1C 6.4 04/06/2017  Previously 10.5% (10/2012).  Previously 6.5%.   Pt is on a regimen of: - Metformin XR 1000 mg 2x a day. - Ozempic 0.5 mg weekly in a.m. -she was only able to start this 5 weeks ago (started directly at the target dose and she forgot she needed to titrate) >> had nausea, abd pain, HA  - first 2 weeks >> now resolved Stopped Bydureon due to reaction to the injection sites. We had to stop Invokana 100 mg b/c repeated yeast inf. (09/26/2013) She was on JanuMet when she was prediabetic >> tolerated it well.   She has a One Touch Ultra meter.  Not checking sugars now. From last visit: - am: 90-100 >> 91-99 >> 93-95 - 2h after b'fast: 98-118 >> n/c - lunch:  90-120 >> n/c - 2h after lunch: 120s >> n/c >> 150-160 - dinner  90s-130s >> n/c - 2h after dinner:up to 130 >> 150-160 Highest: 130s >> 160  -No CKD, last BUN/creatinine:  Lab Results  Component Value Date   BUN 13 12/11/2017   CREATININE 1.05 12/11/2017  Last ACR: Normal in 07/2016; but had proteinuria after pregnancy 2010.  -+ HL. Last lipids: Lab Results  Component Value Date   CHOL 213 (H) 12/11/2017   HDL 44.10 12/11/2017   LDLCALC 132 (H) 12/11/2017   LDLDIRECT 117.8 03/07/2013   TRIG 186.0 (H) 12/11/2017   CHOLHDL 5 12/11/2017  On pravastatin. - Last eye exam 04/2018: No DR. -Denies numbness and tingling in her feet.  Graves ds.: -Reviewed latest TFTs: Lab Results  Component Value Date   TSH 1.98 01/30/2018   TSH 3.80 12/11/2017   TSH 5.04 (H) 07/12/2017   TSH  8.45 (H) 04/06/2017   TSH 1.77 12/01/2016   FREET4 0.62 01/30/2018   FREET4 0.64 12/11/2017   FREET4 0.68 07/12/2017   FREET4 0.56 (L) 04/06/2017   FREET4 0.74 12/01/2016   TSI antibodies decreased to undetectable at last check: Lab Results  Component Value Date   TSI <89 01/30/2018   TSI 210 (H) 04/06/2017   TSI 152 (H) 02/11/2016   TSI 188 (H) 12/18/2013   We initially started methimazole 10 mg twice a day, and subsequently tapered it off and stopped it completely in 09/2015.  However, her TSH started to decrease and we had to restart methimazole in 07/2016.  Since then, we were again able to decrease the medication and she has been taking 2.5 mg daily since 06/2017.  In 11/2017 we were able to stop methimazole completely.  She also has a history of PCOS, HTN, vitamin D deficiency.  ROS: Constitutional: + weight gain/no weight loss, no fatigue, no subjective hyperthermia, no subjective hypothermia Eyes: no blurry vision, no xerophthalmia ENT: + sore throat, no nodules palpated in neck, no dysphagia, no odynophagia, no hoarseness Cardiovascular: no CP/no SOB/no palpitations/no leg swelling Respiratory: no cough/no SOB/no wheezing Gastrointestinal: no N/no V/+ D/no C/no acid reflux Musculoskeletal: no muscle aches/no joint aches Skin: no rashes, no hair loss Neurological: no tremors/no numbness/no tingling/no dizziness, + HA  I reviewed  pt's medications, allergies, PMH, social hx, family hx, and changes were documented in the history of present illness. Otherwise, unchanged from my initial visit note..  Past Medical History:  Diagnosis Date  . DM2 (diabetes mellitus, type 2) (HCC) 01/2013 dx  . Hypertension   . Other and unspecified hyperlipidemia   . PCOS (polycystic ovarian syndrome)    Past Surgical History:  Procedure Laterality Date  . CESAREAN SECTION  2010   Social History   Socioeconomic History  . Marital status: Married    Spouse name: Not on file  . Number  of children: Not on file  . Years of education: Not on file  . Highest education level: Not on file  Occupational History  . Not on file  Social Needs  . Financial resource strain: Not on file  . Food insecurity:    Worry: Not on file    Inability: Not on file  . Transportation needs:    Medical: Not on file    Non-medical: Not on file  Tobacco Use  . Smoking status: Never Smoker  . Smokeless tobacco: Never Used  Substance and Sexual Activity  . Alcohol use: No  . Drug use: No  . Sexual activity: Yes    Partners: Male  Lifestyle  . Physical activity:    Days per week: Not on file    Minutes per session: Not on file  . Stress: Not on file  Relationships  . Social connections:    Talks on phone: Not on file    Gets together: Not on file    Attends religious service: Not on file    Active member of club or organization: Not on file    Attends meetings of clubs or organizations: Not on file    Relationship status: Not on file  . Intimate partner violence:    Fear of current or ex partner: Not on file    Emotionally abused: Not on file    Physically abused: Not on file    Forced sexual activity: Not on file  Other Topics Concern  . Not on file  Social History Narrative   Regular exercise: no   Caffeine use: 2 soda's daily   Lives with spouse and son   Works at [employer] as Psychologist, occupationalbanker   Current Outpatient Medications on File Prior to Visit  Medication Sig Dispense Refill  . amLODipine (NORVASC) 5 MG tablet TAKE 1 TABLET BY MOUTH EVERY DAY 90 tablet 3  . Cholecalciferol (VITAMIN D3) 50000 units CAPS Take 1 capsule by mouth once a week. (Patient not taking: Reported on 01/30/2018) 24 capsule 1  . glucose blood (ONE TOUCH ULTRA TEST) test strip     . glucose blood test strip Use 2 times day as instructed - One Touch Ultra 100 each 11  . hydrOXYzine (ATARAX/VISTARIL) 10 MG tablet   0  . levonorgestrel (MIRENA) 20 MCG/24HR IUD 1 Intra Uterine Device (1 each total) by Intrauterine  route once. Placed 07/19/13 - gyn 1 each 0  . metFORMIN (GLUCOPHAGE-XR) 500 MG 24 hr tablet TAKE TWO TABLETS BY MOUTH TWICE A DAY WITH MEALS 360 tablet 3  . metoprolol succinate (TOPROL XL) 50 MG 24 hr tablet Take 1 tablet (50 mg total) by mouth daily. Take with or immediately following a meal. 90 tablet 1  . metoprolol succinate (TOPROL-XL) 25 MG 24 hr tablet TAKE 2 TABLETS BY MOUTH EVERY DAY 60 tablet 11  . pravastatin (PRAVACHOL) 20 MG tablet TAKE 1 TABLET BY MOUTH  EVERYDAY AT BEDTIME 90 tablet 3  . QSYMIA 7.5-46 MG CP24 TAKE 1 CAPSULE BY MOUTH EVERY DAY 30 capsule 3  . Semaglutide,0.25 or 0.5MG /DOS, (OZEMPIC, 0.25 OR 0.5 MG/DOSE,) 2 MG/1.5ML SOPN Inject 0.5 mg into the skin once a week. 2 pen 5  . topiramate (TOPAMAX) 25 MG tablet Take 2 tablets (50 mg total) by mouth daily. 60 tablet 11   No current facility-administered medications on file prior to visit.    Allergies  Allergen Reactions  . Invokana [Canagliflozin]     Recurrent yeast infections   Family History  Problem Relation Age of Onset  . Diabetes Mother        Type II  . Thyroid disease Mother   . Hypertension Mother   . Hyperlipidemia Mother   . Diabetes Father        TYPE II  . Hypertension Father   . Hyperlipidemia Father   . Heart disease Father        CAD with stent  . Hyperlipidemia Maternal Grandmother   . Hypertension Maternal Grandmother   . Obesity Maternal Grandmother   . Diabetes Maternal Grandmother   . Cancer Paternal Grandmother 20       colon cancer  . Diabetes Paternal Grandmother    PE: BP 110/80   Pulse (!) 106   Ht 5\' 3"  (1.6 m)   Wt 210 lb (95.3 kg)   SpO2 97%   BMI 37.20 kg/m  Body mass index is 37.2 kg/m.  Wt Readings from Last 3 Encounters:  06/05/18 210 lb (95.3 kg)  01/30/18 209 lb (94.8 kg)  12/11/17 206 lb (93.4 kg)   Constitutional: overweight, in NAD Eyes: PERRLA, EOMI, no exophthalmos ENT: moist mucous membranes, no thyromegaly, no cervical  lymphadenopathy Cardiovascular: Tachycardia, RR, No MRG Respiratory: CTA B Gastrointestinal: abdomen soft, NT, ND, BS+ Musculoskeletal: no deformities, strength intact in all 4 Skin: moist, warm, no rashes Neurological: no tremor with outstretched hands, DTR normal in all 4  ASSESSMENT: 1. DM2, non-insulin-dependent, uncontrolled, without complications  2. Graves ds - 03/12/2014: Thyroid uptake and scan: Consistent with Graves' disease: The 24 hr uptake by the thyroid gland is 65%. Normal 24 hr uptake is 10-30%. On thyroid imaging, the thyroid activity appears diffusely increased. No focal hot or cold nodules are identified.  3.  Hyperlipidemia  4. Obesity  PLAN:  1. Patient with well-controlled diabetes, on oral antidiabetic regimen and GLP-1 receptor agonist added at last visit.  At that time, sugars were a little higher than before, later in the day, with still CBGs at goal in the morning.  HbA1c was 7.1% we added Ozempic for both diabetes and to help with weight loss.  She has no side effects from this now, but she had GI side effects and headaches after she started as she forgot she needed to titrate the dose up and started directly at 0.5 mg weekly. -At this visit, unfortunately, we do not have any sugars if she stopped checking after last visit.  I strongly advised her to start taking again every day. - today, HbA1c is 6.8% (improved) -We will continue the current regimen for now.  At next visit, we may need to increase Ozempic if she tolerates this well. -I advised her to:  Patient Instructions  Please continue off methimazole.  Continue: - Metformin ER 1000 mg 2x a day with meals - Ozempic 0.5 mg weekly in a.m.  Please return in 4 months with your sugar log.   - advised  for yearly eye exams >> she is now UTD - Return to clinic in 4 mo with sugar log   2. Graves ds  -Previously on methimazole 2.5 mg daily without side effects.  In the past, she was able to come off  methimazole but her Graves' disease recurred.  However, we were able to stop methimazole again in 11/2017 and TFTs remain normal at the next check in 01/2018 -She continues Toprol-XL 50 mg daily -We will need to check her TFTs today.  If we need to restart methimazole, will need to stay on the low dose for a longer time. -She does not appear to require RAI treatment or surgery for her Graves' disease  3.  Hyperlipidemia - Reviewed latest lipid panel from 11/2017, LDL was higher, above goal, and also triglycerides were higher. Lab Results  Component Value Date   CHOL 213 (H) 12/11/2017   HDL 44.10 12/11/2017   LDLCALC 132 (H) 12/11/2017   LDLDIRECT 117.8 03/07/2013   TRIG 186.0 (H) 12/11/2017   CHOLHDL 5 12/11/2017  - Continues pravastatin 20 without side effects.  This may need to be increased after next lipid panel. - We will check another lipid panel today.  She is fasting.  4.  Obesity -Working with PCP on this -Failed Belviq, however, this is not taken out of the market -Developed injection site reaction to Bydureon in the past -Was doing well on Qsymia but this was not approved by her insurance -We started GLP 1 receptor agonist at last visit, which should also help with weight loss -However, she did not lose any weight since last visit, but she only started 5 weeks ago.  Needs refills for Metformin and Pravastatin.  Component     Latest Ref Rng & Units 06/05/2018  Cholesterol     0 - 200 mg/dL 786  Triglycerides     0.0 - 149.0 mg/dL 754.4 (H)  HDL Cholesterol     >39.00 mg/dL 92.01  VLDL     0.0 - 00.7 mg/dL 12.1 (H)  Total CHOL/HDL Ratio      4  NonHDL      140.66  Hemoglobin A1C     4.0 - 5.6 % 6.8 (A)  TSH     0.35 - 4.50 uIU/mL 1.19  T4,Free(Direct)     0.60 - 1.60 ng/dL 9.75  Triiodothyronine,Free,Serum     2.3 - 4.2 pg/mL 3.2  Direct LDL     mg/dL 88.3  TFTs are normal.  Triglycerides higher.  LDL improved.  For now we will continue the same dose of  pravastatin.   Carlus Pavlov, MD PhD [facility]

## 2018-06-05 NOTE — Patient Instructions (Addendum)
Please continue off methimazole.  Continue: - Metformin ER 1000 mg 2x a day with meals - Ozempic 0.5 mg weekly in a.m.  Please stop at the lab.  Please return in 4 months with your sugar log.

## 2018-06-06 MED ORDER — PRAVASTATIN SODIUM 20 MG PO TABS: ORAL_TABLET | ORAL | 3 refills | Status: DC

## 2018-06-06 MED ORDER — METFORMIN HCL ER 500 MG PO TB24: ORAL_TABLET | ORAL | 3 refills | Status: DC

## 2018-06-13 ENCOUNTER — Ambulatory Visit: Payer: BLUE CROSS/BLUE SHIELD | Admitting: Internal Medicine

## 2018-06-13 ENCOUNTER — Encounter: Payer: Self-pay | Admitting: Internal Medicine

## 2018-06-13 VITALS — BP 112/80 | HR 101 | Temp 98.3°F | Ht 63.0 in | Wt 210.0 lb

## 2018-06-13 DIAGNOSIS — R51 Headache: Secondary | ICD-10-CM

## 2018-06-13 DIAGNOSIS — E119 Type 2 diabetes mellitus without complications: Secondary | ICD-10-CM

## 2018-06-13 DIAGNOSIS — R519 Headache, unspecified: Secondary | ICD-10-CM

## 2018-06-13 DIAGNOSIS — Z0000 Encounter for general adult medical examination without abnormal findings: Secondary | ICD-10-CM

## 2018-06-13 DIAGNOSIS — E1169 Type 2 diabetes mellitus with other specified complication: Secondary | ICD-10-CM

## 2018-06-13 DIAGNOSIS — E785 Hyperlipidemia, unspecified: Secondary | ICD-10-CM

## 2018-06-13 MED ORDER — VITAMIN D (ERGOCALCIFEROL) 1.25 MG (50000 UNIT) PO CAPS: 50000.0000 [IU] | ORAL_CAPSULE | ORAL | 0 refills | Status: DC

## 2018-06-13 MED ORDER — TOPIRAMATE 25 MG PO TABS: 50.0000 mg | ORAL_TABLET | Freq: Every day | ORAL | 3 refills | Status: DC

## 2018-06-13 MED ORDER — IBUPROFEN 800 MG PO TABS: 800.0000 mg | ORAL_TABLET | Freq: Three times a day (TID) | ORAL | 0 refills | Status: DC | PRN

## 2018-06-13 NOTE — Patient Instructions (Signed)

## 2018-06-13 NOTE — Assessment & Plan Note (Signed)
Refill topiramate as this was helping with headaches. Rx for ibuprofen 800 mg to use as needed for headaches as well.

## 2018-06-13 NOTE — Assessment & Plan Note (Signed)
Flu shot up to date. Tetanus up to date. Pap smear up to date with gyn. Counseled about sun safety and mole surveillance. Counseled about the dangers of distracted driving. Given 10 year screening recommendations.   

## 2018-06-13 NOTE — Assessment & Plan Note (Signed)
Taking pravastatin and we talked about high triglycerides and want to monitor in 3-6 months with diet changes she will work on.

## 2018-06-13 NOTE — Assessment & Plan Note (Signed)
Seeing endo and at goal. Taking statin and metformin and ozempic.

## 2018-06-13 NOTE — Assessment & Plan Note (Signed)
BMI 37 but complicated by diabetes, hyperlipidemia and PCOS.

## 2018-06-13 NOTE — Progress Notes (Signed)
   Subjective:   Patient ID: Ana [name], female    DOB: [birth year], 38 y.o.   MRN: [id number]  HPI The patient is a 38 YO female coming in for physical.   PMH, FMH, social history reviewed and updated  Review of Systems  Constitutional: Negative.   HENT: Negative.   Eyes: Negative.   Respiratory: Negative for cough, chest tightness and shortness of breath.   Cardiovascular: Negative for chest pain, palpitations and leg swelling.  Gastrointestinal: Negative for abdominal distention, abdominal pain, constipation, diarrhea, nausea and vomiting.  Musculoskeletal: Negative.   Skin: Negative.   Neurological: Negative.   Psychiatric/Behavioral: Negative.     Objective:  Physical Exam Constitutional:      Appearance: She is well-developed.  HENT:     Head: Normocephalic and atraumatic.  Neck:     Musculoskeletal: Normal range of motion.  Cardiovascular:     Rate and Rhythm: Normal rate and regular rhythm.  Pulmonary:     Effort: Pulmonary effort is normal. No respiratory distress.     Breath sounds: Normal breath sounds. No wheezing or rales.  Abdominal:     General: Bowel sounds are normal. There is no distension.     Palpations: Abdomen is soft.     Tenderness: There is no abdominal tenderness. There is no rebound.  Skin:    General: Skin is warm and dry.  Neurological:     Mental Status: She is alert and oriented to person, place, and time.     Coordination: Coordination normal.     Vitals:   06/13/18 0756  BP: 112/80  Pulse: (!) 101  Temp: 98.3 F (36.8 C)  TempSrc: Oral  SpO2: 97%  Weight: 210 lb (95.3 kg)  Height: 5\' 3"  (1.6 m)    Assessment & Plan:

## 2018-07-10 ENCOUNTER — Other Ambulatory Visit: Payer: Self-pay | Admitting: Internal Medicine

## 2018-09-05 DIAGNOSIS — Z30433 Encounter for removal and reinsertion of intrauterine contraceptive device: Secondary | ICD-10-CM | POA: Diagnosis not present

## 2018-09-08 ENCOUNTER — Other Ambulatory Visit: Payer: Self-pay | Admitting: Internal Medicine

## 2018-10-01 ENCOUNTER — Other Ambulatory Visit: Payer: Self-pay | Admitting: Internal Medicine

## 2018-10-02 DIAGNOSIS — B373 Candidiasis of vulva and vagina: Secondary | ICD-10-CM | POA: Diagnosis not present

## 2018-10-02 DIAGNOSIS — M545 Low back pain: Secondary | ICD-10-CM | POA: Diagnosis not present

## 2018-10-09 ENCOUNTER — Other Ambulatory Visit: Payer: Self-pay | Admitting: Internal Medicine

## 2018-10-12 ENCOUNTER — Other Ambulatory Visit: Payer: Self-pay

## 2018-10-16 ENCOUNTER — Ambulatory Visit: Payer: BC Managed Care – PPO | Admitting: Internal Medicine

## 2018-10-16 ENCOUNTER — Encounter: Payer: Self-pay | Admitting: Internal Medicine

## 2018-10-16 ENCOUNTER — Other Ambulatory Visit: Payer: Self-pay

## 2018-10-16 VITALS — BP 122/80 | HR 82 | Ht 63.0 in | Wt 197.0 lb

## 2018-10-16 DIAGNOSIS — E785 Hyperlipidemia, unspecified: Secondary | ICD-10-CM

## 2018-10-16 DIAGNOSIS — E119 Type 2 diabetes mellitus without complications: Secondary | ICD-10-CM

## 2018-10-16 DIAGNOSIS — Z6836 Body mass index (BMI) 36.0-36.9, adult: Secondary | ICD-10-CM

## 2018-10-16 DIAGNOSIS — E0500 Thyrotoxicosis with diffuse goiter without thyrotoxic crisis or storm: Secondary | ICD-10-CM | POA: Diagnosis not present

## 2018-10-16 DIAGNOSIS — E1169 Type 2 diabetes mellitus with other specified complication: Secondary | ICD-10-CM

## 2018-10-16 LAB — POCT GLYCOSYLATED HEMOGLOBIN (HGB A1C): Hemoglobin A1C: 5.4 % (ref 4.0–5.6)

## 2018-10-16 MED ORDER — METFORMIN HCL ER 500 MG PO TB24: ORAL_TABLET | ORAL | 3 refills | Status: DC

## 2018-10-16 NOTE — Patient Instructions (Addendum)
You can continue off methimazole.  Please continue: - Ozempic 0.5 mg weekly in a.m.  Please decrease: - Metformin ER to 1000 mg with dinner.  Please return in 6 months with your sugar log.

## 2018-10-16 NOTE — Progress Notes (Signed)
Patient ID: Ana [name], female   DOB: 01/03/1981, 38 y.o.   MRN: [id number]  HPI: Ana [name] is a 38 y.o.-year-old female, returning for f/u DM2, dx summer 2014, non-insulin-dependent, controlled, without long-term complications and also Graves ds. Last visit 4 months ago.  She has done very well since last visit after he started to pack her lunches from home.  Sugars improved and she also lost weight.   Reviewed latest hemoglobin A1c was: Lab Results  Component Value Date   HGBA1C 6.8 (A) 06/05/2018   HGBA1C 7.1 (H) 12/11/2017   HGBA1C 5.4 08/02/2017  Previously 10.5% (10/2012).  Previously 6.5%.   Pt is on a regimen of: - Metformin ER 1000 mg 2x a day. - Ozempic 0.5 mg weekly in a.m. -she was only able to start this 5 weeks ago (started directly at the target dose and she forgot she needed to titrate) >> had nausea, abd pain, HA in first 2 weeks but is now resolved Stopped Bydureon due to reaction to the injection sites. We had to stop Invokana 100 mg b/c repeated yeast inf. (09/26/2013) She was on JanuMet when she was prediabetic >> tolerated it well.   She has a One Touch Ultra meter.  She is checking sugars 0 to once a day: - am: 90-100 >> 91-99 >> 93-95 >> 90s - 2h after b'fast: 98-118 >> n/c - lunch:  90-120 >> n/c - 2h after lunch: 120s >> n/c >> 150-160 >> 121-130 - dinner  90s-130s >> n/c - 2h after dinner:up to 130 >> 150-160 >> 120-130 Highest: 130s >> 160 >> 150s.  She is packing her own lunch.  -No CKD, last BUN/creatinine:  Lab Results  Component Value Date   BUN 13 12/11/2017   CREATININE 1.05 12/11/2017   Lab Results  Component Value Date   MICRALBCREAT 3.4 12/11/2017   MICRALBCREAT 10.3 08/03/2016   MICRALBCREAT 4.0 07/10/2015   MICRALBCREAT 5.7 02/05/2014   MICRALBCREAT 9.4 07/11/2013   MICRALBCREAT 36.3 (H) 03/07/2013   Last ACR: Normal in 07/2016; but had proteinuria after pregnancy 2010.  -+ HL. Last lipids: Lab Results   Component Value Date   CHOL 181 06/05/2018   HDL 40.20 06/05/2018   LDLCALC 132 (H) 12/11/2017   LDLDIRECT 97.0 06/05/2018   TRIG 260.0 (H) 06/05/2018   CHOLHDL 4 06/05/2018  On pravastatin. - Last eye exam 04/2018: No DR -no numbness and tingling in her feet.  Graves ds.: -Reviewed her TFTs Lab Results  Component Value Date   TSH 1.19 06/05/2018   TSH 1.98 01/30/2018   TSH 3.80 12/11/2017   TSH 5.04 (H) 07/12/2017   TSH 8.45 (H) 04/06/2017   FREET4 0.75 06/05/2018   FREET4 0.62 01/30/2018   FREET4 0.64 12/11/2017   FREET4 0.68 07/12/2017   FREET4 0.56 (L) 04/06/2017   At last check, her TSI antibodies were undetectable: Lab Results  Component Value Date   TSI <89 01/30/2018   TSI 210 (H) 04/06/2017   TSI 152 (H) 02/11/2016   TSI 188 (H) 12/18/2013   We initially started methimazole 10 mg twice a day, and subsequently tapered it off and stopped it completely in 09/2015.  However, her TSH started to decrease and we had to restart methimazole in 07/2016.  Since then, we were again able to decrease the medication and she has been taking 2.5 mg daily since 06/2017.  In 11/2017, we were able to stop methimazole completely.  She also has a history of PCOS, HTN,  vitamin D deficiency.  ROS: Constitutional: no weight gain/+ weight loss, no fatigue, no subjective hyperthermia, no subjective hypothermia Eyes: no blurry vision, no xerophthalmia ENT: no sore throat, no nodules palpated in neck, no dysphagia, no odynophagia, no hoarseness Cardiovascular: no CP/no SOB/no palpitations/no leg swelling Respiratory: no cough/no SOB/no wheezing Gastrointestinal: no N/no V/no D/no C/no acid reflux Musculoskeletal: no muscle aches/no joint aches Skin: no rashes, no hair loss Neurological: no tremors/no numbness/no tingling/no dizziness  I reviewed pt's medications, allergies, PMH, social hx, family hx, and changes were documented in the history of present illness. Otherwise, unchanged  from my initial visit note.  Past Medical History:  Diagnosis Date  . DM2 (diabetes mellitus, type 2) (HCC) 01/2013 dx  . Hypertension   . Other and unspecified hyperlipidemia   . PCOS (polycystic ovarian syndrome)    Past Surgical History:  Procedure Laterality Date  . CESAREAN SECTION  2010   Social History   Socioeconomic History  . Marital status: Married    Spouse name: Not on file  . Number of children: Not on file  . Years of education: Not on file  . Highest education level: Not on file  Occupational History  . Not on file  Social Needs  . Financial resource strain: Not on file  . Food insecurity    Worry: Not on file    Inability: Not on file  . Transportation needs    Medical: Not on file    Non-medical: Not on file  Tobacco Use  . Smoking status: Never Smoker  . Smokeless tobacco: Never Used  Substance and Sexual Activity  . Alcohol use: No  . Drug use: No  . Sexual activity: Yes    Partners: Male  Lifestyle  . Physical activity    Days per week: Not on file    Minutes per session: Not on file  . Stress: Not on file  Relationships  . Social Musicianconnections    Talks on phone: Not on file    Gets together: Not on file    Attends religious service: Not on file    Active member of club or organization: Not on file    Attends meetings of clubs or organizations: Not on file    Relationship status: Not on file  . Intimate partner violence    Fear of current or ex partner: Not on file    Emotionally abused: Not on file    Physically abused: Not on file    Forced sexual activity: Not on file  Other Topics Concern  . Not on file  Social History Narrative   Regular exercise: no   Caffeine use: 2 soda's daily   Lives with spouse and son   Works at [employer] as Psychologist, occupationalbanker   Current Outpatient Medications on File Prior to Visit  Medication Sig Dispense Refill  . amLODipine (NORVASC) 5 MG tablet TAKE 1 TABLET BY MOUTH EVERY DAY 90 tablet 3  . glucose blood test  strip Use 2 times day as instructed - One Touch Ultra 100 each 11  . ibuprofen (ADVIL) 800 MG tablet TAKE 1 TABLET BY MOUTH EVERY 8 HOURS AS NEEDED 90 tablet 1  . levonorgestrel (MIRENA) 20 MCG/24HR IUD 1 Intra Uterine Device (1 each total) by Intrauterine route once. Placed 07/19/13 - gyn 1 each 0  . metFORMIN (GLUCOPHAGE-XR) 500 MG 24 hr tablet Take 2 tablets twice a day by mouth. 360 tablet 3  . metoprolol succinate (TOPROL XL) 50 MG 24 hr tablet  Take 1 tablet (50 mg total) by mouth daily. Take with or immediately following a meal. 90 tablet 1  . OZEMPIC, 0.25 OR 0.5 MG/DOSE, 2 MG/1.5ML SOPN INJECT 0.5 MG INTO THE SKIN ONCE A WEEK. 2 pen 5  . pravastatin (PRAVACHOL) 20 MG tablet TAKE 1 TABLET BY MOUTH EVERYDAY AT BEDTIME 90 tablet 3  . topiramate (TOPAMAX) 25 MG tablet Take 2 tablets (50 mg total) by mouth daily. 180 tablet 3  . Vitamin D, Ergocalciferol, (DRISDOL) 1.25 MG (50000 UT) CAPS capsule TAKE 1 CAPSULE (50,000 UNITS) BY MOUTH EVERY 7 DAYS 12 capsule 0   No current facility-administered medications on file prior to visit.    Allergies  Allergen Reactions  . Invokana [Canagliflozin]     Recurrent yeast infections   Family History  Problem Relation Age of Onset  . Diabetes Mother        Type II  . Thyroid disease Mother   . Hypertension Mother   . Hyperlipidemia Mother   . Diabetes Father        TYPE II  . Hypertension Father   . Hyperlipidemia Father   . Heart disease Father        CAD with stent  . Hyperlipidemia Maternal Grandmother   . Hypertension Maternal Grandmother   . Obesity Maternal Grandmother   . Diabetes Maternal Grandmother   . Cancer Paternal Grandmother 194       colon cancer  . Diabetes Paternal Grandmother    PE: BP 122/80   Pulse 82   Ht 5\' 3"  (1.6 m)   Wt 197 lb (89.4 kg)   SpO2 98%   BMI 34.90 kg/m  There is no height or weight on file to calculate BMI.  Wt Readings from Last 3 Encounters:  10/16/18 197 lb (89.4 kg)  06/13/18 210 lb (95.3  kg)  06/05/18 210 lb (95.3 kg)   Constitutional: overweight, in NAD Eyes: PERRLA, EOMI, no exophthalmos ENT: moist mucous membranes, no thyromegaly, no cervical lymphadenopathy Cardiovascular: RRR, No MRG Respiratory: CTA B Gastrointestinal: abdomen soft, NT, ND, BS+ Musculoskeletal: no deformities, strength intact in all 4 Skin: moist, warm, no rashes Neurological: no tremor with outstretched hands, DTR normal in all 4  ASSESSMENT: 1. DM2, non-insulin-dependent, uncontrolled, without complications  2. Graves ds - 03/12/2014: Thyroid uptake and scan: Consistent with Graves' disease: The 24 hr uptake by the thyroid gland is 65%. Normal 24 hr uptake is 10-30%. On thyroid imaging, the thyroid activity appears diffusely increased. No focal hot or cold nodules are identified.  3.  Hyperlipidemia  4. Obesity  PLAN:  1. Patient with controlled diabetes, on oral antidiabetic regimen with metformin ER and also GLP-1 receptor agonist -At last visit, she was not checking sugars and I strongly advised her to restart.  Her HbA1c at that time was 6.8%, better -At this visit, sugars are excellent after she started to take her Lantus from home.  I strongly advised her to continue.  At this point, we can decrease the dose of her metformin -I advised her to: Patient Instructions  You can continue off methimazole.  Please continue: - Ozempic 0.5 mg weekly in a.m.  Please decrease: - Metformin ER to 1000 mg with dinner.  Please return in 6 months with your sugar log.   - today, HbA1c is 5.4% (excellent improvement) - continue checking sugars at different times of the day - check 1x a day or every other day, rotating checks - advised for yearly eye exams >>  she is UTD - will check her kidney function, liver function, and albumin to creatinine ratio today - Return to clinic in 6 mo with sugar log   2. Graves ds  -Previously on methimazole 2.5 mg daily.  In the past, she was able to come  off methimazole but her Graves' disease recurred afterwards.  We were able to stop methimazole again in 11/2017 and her TFTs remain normal.  Latest set of TFTs were checked in 05/2018. -She denies thyrotoxic symptoms: No heat intolerance, uncontrolled weight loss, tremors, palpitations, anxiety -We will recheck her TFTs today.  If we need to restart methimazole, she will need to stay on the low dose for longer time. -She continues Toprol-XL 50 mg daily  3.  Hyperlipidemia - Reviewed latest lipid panel from 05/2018: LDL improved, now at goal, triglycerides still high Lab Results  Component Value Date   CHOL 181 06/05/2018   HDL 40.20 06/05/2018   LDLCALC 132 (H) 12/11/2017   LDLDIRECT 97.0 06/05/2018   TRIG 260.0 (H) 06/05/2018   CHOLHDL 4 06/05/2018  - Continues pravastatin 20 without side effects.  4.  Obesity -Working with PCP on this -Failed Belviq, however, has not taken off the market -Developed injection site reaction to Bydureon in the past -Was doing well on Qsymia but this was not approved by her insurance -Currently on Ozempic, which should also help with weight loss -lost 13 lbs since last OV!  Carlus Pavlovristina Ephrem Carrick, MD PhD [facility]

## 2018-10-17 ENCOUNTER — Other Ambulatory Visit: Payer: BC Managed Care – PPO

## 2018-10-17 ENCOUNTER — Telehealth: Payer: Self-pay

## 2018-10-17 DIAGNOSIS — E119 Type 2 diabetes mellitus without complications: Secondary | ICD-10-CM

## 2018-10-17 DIAGNOSIS — E785 Hyperlipidemia, unspecified: Secondary | ICD-10-CM

## 2018-10-17 DIAGNOSIS — E1169 Type 2 diabetes mellitus with other specified complication: Secondary | ICD-10-CM

## 2018-10-17 DIAGNOSIS — E0500 Thyrotoxicosis with diffuse goiter without thyrotoxic crisis or storm: Secondary | ICD-10-CM

## 2018-10-17 NOTE — Telephone Encounter (Signed)
-----   Message from [name], MD sent at 10/16/2018  2:09 PM EDT ----- Regarding: RE: labs M, Can you please reorder the labs from today? Ty, C ----- Message ----- From: Kaylyn Lim I Sent: 10/16/2018   1:29 PM EDT To: [name], MD Subject: labs                                           Please put labs in patient coming tomorrow morning 10-17-2018.  Thanks

## 2018-10-26 ENCOUNTER — Other Ambulatory Visit (INDEPENDENT_AMBULATORY_CARE_PROVIDER_SITE_OTHER): Payer: BC Managed Care – PPO

## 2018-10-26 ENCOUNTER — Other Ambulatory Visit: Payer: Self-pay

## 2018-10-26 ENCOUNTER — Telehealth: Payer: Self-pay

## 2018-10-26 DIAGNOSIS — E1169 Type 2 diabetes mellitus with other specified complication: Secondary | ICD-10-CM

## 2018-10-26 DIAGNOSIS — E785 Hyperlipidemia, unspecified: Secondary | ICD-10-CM

## 2018-10-26 DIAGNOSIS — E119 Type 2 diabetes mellitus without complications: Secondary | ICD-10-CM | POA: Diagnosis not present

## 2018-10-26 DIAGNOSIS — E0500 Thyrotoxicosis with diffuse goiter without thyrotoxic crisis or storm: Secondary | ICD-10-CM

## 2018-10-26 LAB — COMPREHENSIVE METABOLIC PANEL
ALT: 11 U/L (ref 0–35)
AST: 14 U/L (ref 0–37)
Albumin: 4.2 g/dL (ref 3.5–5.2)
Alkaline Phosphatase: 61 U/L (ref 39–117)
BUN: 15 mg/dL (ref 6–23)
CO2: 23 mEq/L (ref 19–32)
Calcium: 8.7 mg/dL (ref 8.4–10.5)
Chloride: 110 mEq/L (ref 96–112)
Creatinine, Ser: 0.83 mg/dL (ref 0.40–1.20)
GFR: 92.6 mL/min (ref >60.00)
Glucose, Bld: 83 mg/dL (ref 70–99)
Potassium: 3.7 mEq/L (ref 3.5–5.1)
Sodium: 141 mEq/L (ref 135–145)
Total Bilirubin: 0.3 mg/dL (ref 0.2–1.2)
Total Protein: 7.4 g/dL (ref 6.0–8.3)

## 2018-10-26 LAB — T4, FREE: Free T4: 0.71 ng/dL (ref 0.60–1.60)

## 2018-10-26 LAB — T3, FREE: T3, Free: 3.2 pg/mL (ref 2.3–4.2)

## 2018-10-26 LAB — MICROALBUMIN / CREATININE URINE RATIO
Creatinine,U: 151.1 mg/dL
Microalb Creat Ratio: 2 mg/g (ref 0.0–30.0)
Microalb, Ur: 3.1 mg/dL — ABNORMAL HIGH (ref 0.0–1.9)

## 2018-10-26 LAB — TSH: TSH: 0.57 u[IU]/mL (ref 0.35–4.50)

## 2018-10-26 NOTE — Telephone Encounter (Signed)
-----   Message from [name], MD sent at 10/26/2018  4:38 PM EDT ----- Ana [name], can you please call pt: All her labs are normal, including her thyroid tests.

## 2018-10-29 NOTE — Telephone Encounter (Signed)
Notified patient of message from Dr. Gherghe, patient expressed understanding and agreement. No further questions.  

## 2018-12-11 DIAGNOSIS — D485 Neoplasm of uncertain behavior of skin: Secondary | ICD-10-CM | POA: Diagnosis not present

## 2018-12-11 DIAGNOSIS — B079 Viral wart, unspecified: Secondary | ICD-10-CM | POA: Diagnosis not present

## 2018-12-11 DIAGNOSIS — L821 Other seborrheic keratosis: Secondary | ICD-10-CM | POA: Diagnosis not present

## 2018-12-26 ENCOUNTER — Other Ambulatory Visit: Payer: Self-pay | Admitting: Internal Medicine

## 2019-01-04 ENCOUNTER — Other Ambulatory Visit: Payer: Self-pay

## 2019-01-14 DIAGNOSIS — Z01419 Encounter for gynecological examination (general) (routine) without abnormal findings: Secondary | ICD-10-CM | POA: Diagnosis not present

## 2019-01-14 DIAGNOSIS — Z6836 Body mass index (BMI) 36.0-36.9, adult: Secondary | ICD-10-CM | POA: Diagnosis not present

## 2019-01-14 DIAGNOSIS — G4700 Insomnia, unspecified: Secondary | ICD-10-CM | POA: Diagnosis not present

## 2019-01-15 ENCOUNTER — Other Ambulatory Visit: Payer: Self-pay | Admitting: Internal Medicine

## 2019-03-28 DIAGNOSIS — R0981 Nasal congestion: Secondary | ICD-10-CM | POA: Diagnosis not present

## 2019-03-28 DIAGNOSIS — J0100 Acute maxillary sinusitis, unspecified: Secondary | ICD-10-CM | POA: Diagnosis not present

## 2019-03-28 DIAGNOSIS — R52 Pain, unspecified: Secondary | ICD-10-CM | POA: Diagnosis not present

## 2019-03-28 DIAGNOSIS — Z20828 Contact with and (suspected) exposure to other viral communicable diseases: Secondary | ICD-10-CM | POA: Diagnosis not present

## 2019-03-28 DIAGNOSIS — R05 Cough: Secondary | ICD-10-CM | POA: Diagnosis not present

## 2019-03-28 DIAGNOSIS — J029 Acute pharyngitis, unspecified: Secondary | ICD-10-CM | POA: Diagnosis not present

## 2019-03-29 ENCOUNTER — Other Ambulatory Visit: Payer: Self-pay | Admitting: Internal Medicine

## 2019-03-29 NOTE — Telephone Encounter (Signed)
OK 

## 2019-03-29 NOTE — Telephone Encounter (Signed)
Please advise on refill.

## 2019-04-01 ENCOUNTER — Other Ambulatory Visit: Payer: Self-pay | Admitting: Internal Medicine

## 2019-04-16 ENCOUNTER — Other Ambulatory Visit: Payer: Self-pay | Admitting: Internal Medicine

## 2019-04-23 ENCOUNTER — Other Ambulatory Visit: Payer: Self-pay

## 2019-04-24 ENCOUNTER — Other Ambulatory Visit: Payer: Self-pay

## 2019-04-24 ENCOUNTER — Ambulatory Visit (INDEPENDENT_AMBULATORY_CARE_PROVIDER_SITE_OTHER): Payer: BC Managed Care – PPO | Admitting: Internal Medicine

## 2019-04-24 ENCOUNTER — Encounter: Payer: Self-pay | Admitting: Internal Medicine

## 2019-04-24 VITALS — BP 110/80 | HR 114 | Ht 63.0 in | Wt 192.0 lb

## 2019-04-24 DIAGNOSIS — Z6836 Body mass index (BMI) 36.0-36.9, adult: Secondary | ICD-10-CM

## 2019-04-24 DIAGNOSIS — E0500 Thyrotoxicosis with diffuse goiter without thyrotoxic crisis or storm: Secondary | ICD-10-CM

## 2019-04-24 DIAGNOSIS — E1169 Type 2 diabetes mellitus with other specified complication: Secondary | ICD-10-CM

## 2019-04-24 DIAGNOSIS — E785 Hyperlipidemia, unspecified: Secondary | ICD-10-CM

## 2019-04-24 DIAGNOSIS — E119 Type 2 diabetes mellitus without complications: Secondary | ICD-10-CM

## 2019-04-24 LAB — TSH: TSH: <0.01 u[IU]/mL — ABNORMAL LOW (ref 0.35–4.50)

## 2019-04-24 LAB — LIPID PANEL
Cholesterol: 153 mg/dL (ref 0–200)
HDL: 36.1 mg/dL — ABNORMAL LOW (ref >39.00)
LDL Cholesterol: 89 mg/dL (ref 0–99)
NonHDL: 117.03
Total CHOL/HDL Ratio: 4
Triglycerides: 142 mg/dL (ref 0.0–149.0)
VLDL: 28.4 mg/dL (ref 0.0–40.0)

## 2019-04-24 LAB — POCT GLYCOSYLATED HEMOGLOBIN (HGB A1C): Hemoglobin A1C: 5.6 % (ref 4.0–5.6)

## 2019-04-24 LAB — T4, FREE: Free T4: 1.89 ng/dL — ABNORMAL HIGH (ref 0.60–1.60)

## 2019-04-24 LAB — T3, FREE: T3, Free: 6 pg/mL — ABNORMAL HIGH (ref 2.3–4.2)

## 2019-04-24 NOTE — Progress Notes (Signed)
Patient ID: Ana [name], female   DOB: [birth year], 39 y.o.   MRN: [id number]  This visit occurred during the SARS-CoV-2 public health emergency.  Safety protocols were in place, including screening questions prior to the visit, additional usage of staff PPE, and extensive cleaning of exam room while observing appropriate contact time as indicated for disinfecting solutions.   HPI: Ana [name] is a 39 y.o.-year-old female, returning for f/u DM2, dx summer 2014, non-insulin-dependent, controlled, without long-term complications and also Graves ds. Last visit 6 months ago.   Reviewed HbA1c levels: Lab Results  Component Value Date   HGBA1C 5.4 10/16/2018   HGBA1C 6.8 (A) 06/05/2018   HGBA1C 7.1 (H) 12/11/2017  Previously 10.5% (10/2012).  Previously 6.5%.   Patient is on a regimen of: - Metformin ER 1000 mg 2x a day. - Ozempic 0.5 mg weekly in a.m. >> had nausea, abd pain, HA in first 2 weeks but they have now resolved. Stopped Bydureon due to reaction to the injection sites. We had to stop Invokana 100 mg b/c repeated yeast inf. (09/26/2013) She was on JanuMet when she was prediabetic >> tolerated it well.   She has a One Touch Ultra meter.  She is checking sugars 0 to once a day: - am: 90-100 >> 91-99 >> 93-95 >> 90s >> 90s - 2h after b'fast: 98-118 >> n/c - lunch:  90-120 >> n/c - 2h after lunch: 120s >> n/c >> 150-160 >> 121-130 >> 120s-130s - dinner  90s-130s >> n/c - 2h after dinner:up to 130 >> 150-160 >> 120-130 >> 148 Highest: 150s >> 148 (Christmas).  -No CKD, last BUN/creatinine:  Lab Results  Component Value Date   BUN 15 10/26/2018   CREATININE 0.83 10/26/2018   Lab Results  Component Value Date   MICRALBCREAT 2.0 10/26/2018   MICRALBCREAT 3.4 12/11/2017   MICRALBCREAT 10.3 08/03/2016   MICRALBCREAT 4.0 07/10/2015   MICRALBCREAT 5.7 02/05/2014   MICRALBCREAT 9.4 07/11/2013   MICRALBCREAT 36.3 (H) 03/07/2013   Last ACR: Normal in 07/2016; but  had proteinuria after pregnancy 2010.  -+ HL. Last lipids: Lab Results  Component Value Date   CHOL 181 06/05/2018   HDL 40.20 06/05/2018   LDLCALC 132 (H) 12/11/2017   LDLDIRECT 97.0 06/05/2018   TRIG 260.0 (H) 06/05/2018   CHOLHDL 4 06/05/2018  On pravastatin. - Last eye exam 04/2018: No DR -No numbness and tingling in her feet.  Graves ds.:  We reviewed her TFTs: Lab Results  Component Value Date   TSH 0.57 10/26/2018   TSH 1.19 06/05/2018   TSH 1.98 01/30/2018   TSH 3.80 12/11/2017   TSH 5.04 (H) 07/12/2017   FREET4 0.71 10/26/2018   FREET4 0.75 06/05/2018   FREET4 0.62 01/30/2018   FREET4 0.64 12/11/2017   FREET4 0.68 07/12/2017   At last check, her TSI antibodies were undetectable: Lab Results  Component Value Date   TSI <89 01/30/2018   TSI 210 (H) 04/06/2017   TSI 152 (H) 02/11/2016   TSI 188 (H) 12/18/2013   We initially started methimazole 10 mg twice a day, and subsequently tapered it off and stopped it completely in 09/2015.  However, her TSH started to decrease and we had to restart methimazole in 07/2016.  Since then, we were again able to decrease the medication and she has been taking 2.5 mg daily since 06/2017.  In 11/2017, we were able to stop methimazole.  She also has a history of PCOS, HTN, vitamin D deficiency.  ROS: Constitutional: no weight gain/no weight loss, no fatigue, no subjective hyperthermia, no subjective hypothermia Eyes: no blurry vision, no xerophthalmia ENT: no sore throat, no nodules palpated in neck, no dysphagia, no odynophagia, no hoarseness Cardiovascular: no CP/no SOB/+ palpitations (chronic)/no leg swelling Respiratory: no cough/no SOB/no wheezing Gastrointestinal: no N/no V/no D/no C/no acid reflux Musculoskeletal: no muscle aches/no joint aches Skin: no rashes, no hair loss Neurological: + Very faint tremors/no numbness/no tingling/no dizziness  I reviewed pt's medications, allergies, PMH, social hx, family hx, and  changes were documented in the history of present illness. Otherwise, unchanged from my initial visit note.  Past Medical History:  Diagnosis Date  . DM2 (diabetes mellitus, type 2) (HCC) 01/2013 dx  . Hypertension   . Other and unspecified hyperlipidemia   . PCOS (polycystic ovarian syndrome)    Past Surgical History:  Procedure Laterality Date  . CESAREAN SECTION  2010   Social History   Socioeconomic History  . Marital status: Married    Spouse name: Not on file  . Number of children: Not on file  . Years of education: Not on file  . Highest education level: Not on file  Occupational History  . Not on file  Tobacco Use  . Smoking status: Never Smoker  . Smokeless tobacco: Never Used  Substance and Sexual Activity  . Alcohol use: No  . Drug use: No  . Sexual activity: Yes    Partners: Male  Other Topics Concern  . Not on file  Social History Narrative   Regular exercise: no   Caffeine use: 2 soda's daily   Lives with spouse and son   Works at [employer] as Psychologist, occupational   Social Determinants of Corporate investment banker Strain:   . Difficulty of Paying Living Expenses: Not on file  Food Insecurity:   . Worried About Programme researcher, broadcasting/film/video in the Last Year: Not on file  . Ran Out of Food in the Last Year: Not on file  Transportation Needs:   . Lack of Transportation (Medical): Not on file  . Lack of Transportation (Non-Medical): Not on file  Physical Activity:   . Days of Exercise per Week: Not on file  . Minutes of Exercise per Session: Not on file  Stress:   . Feeling of Stress : Not on file  Social Connections:   . Frequency of Communication with Friends and Family: Not on file  . Frequency of Social Gatherings with Friends and Family: Not on file  . Attends Religious Services: Not on file  . Active Member of Clubs or Organizations: Not on file  . Attends Banker Meetings: Not on file  . Marital Status: Not on file  Intimate Partner Violence:   . Fear  of Current or Ex-Partner: Not on file  . Emotionally Abused: Not on file  . Physically Abused: Not on file  . Sexually Abused: Not on file   Current Outpatient Medications on File Prior to Visit  Medication Sig Dispense Refill  . amLODipine (NORVASC) 5 MG tablet Take 1 tablet (5 mg total) by mouth daily. **OFFICE VISIT DUE** 90 tablet 0  . glucose blood test strip Use 2 times day as instructed - One Touch Ultra 100 each 11  . ibuprofen (ADVIL) 800 MG tablet TAKE 1 TABLET BY MOUTH EVERY 8 HOURS AS NEEDED 90 tablet 1  . levonorgestrel (MIRENA) 20 MCG/24HR IUD 1 Intra Uterine Device (1 each total) by Intrauterine route once. Placed 07/19/13 - gyn  1 each 0  . metFORMIN (GLUCOPHAGE-XR) 500 MG 24 hr tablet Take 2 tablets by mouth with dinner 180 tablet 3  . metoprolol succinate (TOPROL XL) 50 MG 24 hr tablet Take 1 tablet (50 mg total) by mouth daily. Take with or immediately following a meal. 90 tablet 1  . metoprolol succinate (TOPROL-XL) 25 MG 24 hr tablet TAKE 2 TABLETS BY MOUTH EVERY DAY 60 tablet 11  . OZEMPIC, 0.25 OR 0.5 MG/DOSE, 2 MG/1.5ML SOPN INJECT 0.5 MG INTO THE SKIN ONCE A WEEK. 3 pen 5  . pravastatin (PRAVACHOL) 20 MG tablet TAKE 1 TABLET BY MOUTH EVERYDAY AT BEDTIME 90 tablet 3  . topiramate (TOPAMAX) 25 MG tablet Take 2 tablets (50 mg total) by mouth daily. 180 tablet 3  . Vitamin D, Ergocalciferol, (DRISDOL) 1.25 MG (50000 UT) CAPS capsule TAKE 1 CAPSULE (50,000 UNITS) BY MOUTH EVERY 7 DAYS 12 capsule 0   No current facility-administered medications on file prior to visit.   Allergies  Allergen Reactions  . Invokana [Canagliflozin]     Recurrent yeast infections   Family History  Problem Relation Age of Onset  . Diabetes Mother        Type II  . Thyroid disease Mother   . Hypertension Mother   . Hyperlipidemia Mother   . Diabetes Father        TYPE II  . Hypertension Father   . Hyperlipidemia Father   . Heart disease Father        CAD with stent  . Hyperlipidemia  Maternal Grandmother   . Hypertension Maternal Grandmother   . Obesity Maternal Grandmother   . Diabetes Maternal Grandmother   . Cancer Paternal Grandmother 55       colon cancer  . Diabetes Paternal Grandmother    PE: BP 110/80   Pulse (!) 114   Ht 5\' 3"  (1.6 m)   Wt 192 lb (87.1 kg)   SpO2 99%   BMI 34.01 kg/m  Body mass index is 34.01 kg/m.  Wt Readings from Last 3 Encounters:  04/24/19 192 lb (87.1 kg)  10/16/18 197 lb (89.4 kg)  06/13/18 210 lb (95.3 kg)   Constitutional: overweight, in NAD Eyes: PERRLA, EOMI, no exophthalmos ENT: moist mucous membranes, no thyromegaly, no cervical lymphadenopathy Cardiovascular: tachycardia, RR, No MRG Respiratory: CTA B Gastrointestinal: abdomen soft, NT, ND, BS+ Musculoskeletal: no deformities, strength intact in all 4 Skin: moist, warm, no rashes Neurological: no tremor with outstretched hands, DTR normal in all 4  ASSESSMENT: 1. DM2, non-insulin-dependent, uncontrolled, without complications  2. Graves ds - 03/12/2014: Thyroid uptake and scan: Consistent with Graves' disease: The 24 hr uptake by the thyroid gland is 65%. Normal 24 hr uptake is 10-30%. On thyroid imaging, the thyroid activity appears diffusely increased. No focal hot or cold nodules are identified.  3.  Hyperlipidemia  4. Obesity  PLAN:  1. Patient with controlled diabetes on Metformin and weekly GLP-1 receptor agonist. -At last visit, HbA1c was excellent, at 5.4% after she started to improve her diet by taking her lunches from home >> we did not change her regimen.  We are mostly using Ozempic for controlling weight, and only marginally for her diabetes control. -At this visit, sugars are excellent, despite going through the holidays.  No need to change her regimen for now. -I advised her to: Patient Instructions  Please continue off methimazole.  Continue Toprol XL 50 mg daily.  Please continue: - Metformin ER 1000 mg with dinner - Ozempic  0.5 mg  weekly in a.m.  Please stop at the lab.  Please return in 6 months with your sugar log.   - we checked her HbA1c: 5.6% (excellent, slightly higher) - advised to check sugars at different times of the day - 1x a day, rotating check times - advised for yearly eye exams >> she is UTD - return to clinic in 6 months  2. Graves ds  -Previously on methimazole 2.5 mg daily.  She was able to come off methimazole but her Graves' disease recurred afterwards.  We again stop methimazole in 11/2017 and her TFTs remains normal afterwards, including at last check, in 10/2018. -She denies thyrotoxic symptoms: No heat intolerance, uncontrolled weight loss, tremors, palpitations, anxiety -We will recheck her TFTs today.  If we need to restart methimazole she may need to stay on the low dose for a longer time -She continues Toprol-XL 50 mg daily, however, she is tachycardic today and she has faint tremors so I wonder if her TFTs are in the thyrotoxic range again.  3.  Hyperlipidemia -Reviewed latest lipid panel from 05/2018: LDL improved, now at goal, triglycerides high Lab Results  Component Value Date   CHOL 181 06/05/2018   HDL 40.20 06/05/2018   LDLCALC 132 (H) 12/11/2017   LDLDIRECT 97.0 06/05/2018   TRIG 260.0 (H) 06/05/2018   CHOLHDL 4 06/05/2018  -Continues pravastatin 20 without side effects. -We will recheck her lipid panel today  4.  Obesity -Failed Belviq, however, this is now taken off the market -She developed injection site reaction to Bydureon in the past -She was doing well on Qsymia but this was not approved by her insurance -We will continue Ozempic which should also help with weight loss -she lost 13 lbs before last OV, 5 more lbs since last OV  Component     Latest Ref Rng & Units 04/24/2019  Cholesterol     0 - 200 mg/dL 161  Triglycerides     0.0 - 149.0 mg/dL 096.0  HDL Cholesterol     >39.00 mg/dL 45.40 (L)  VLDL     0.0 - 40.0 mg/dL 98.1  LDL (calc)     0 - 99  mg/dL 89  Total CHOL/HDL Ratio      4  NonHDL      117.03  TSH     0.35 - 4.50 uIU/mL <0.01 (L)  T4,Free(Direct)     0.60 - 1.60 ng/dL 1.91 (H)  Triiodothyronine,Free,Serum     2.3 - 4.2 pg/mL 6.0 (H)   Patient is again thyrotoxic so we need to restart methimazole.  I would suggest to start 5 mg twice a day and recheck her tests in 1 month.  Carlus Pavlov, MD PhD [facility]

## 2019-04-24 NOTE — Patient Instructions (Signed)
Please continue off methimazole.  Continue Toprol XL 50 mg daily.  Please continue: - Metformin ER 1000 mg with dinner - Ozempic 0.5 mg weekly in a.m.  Please stop at the lab.  Please return in 6 months with your sugar log.

## 2019-04-25 ENCOUNTER — Encounter: Payer: Self-pay | Admitting: Internal Medicine

## 2019-04-25 MED ORDER — METHIMAZOLE 5 MG PO TABS: 5.0000 mg | ORAL_TABLET | Freq: Two times a day (BID) | ORAL | 3 refills | Status: DC

## 2019-04-30 ENCOUNTER — Other Ambulatory Visit: Payer: Self-pay | Admitting: Internal Medicine

## 2019-05-20 MED ORDER — AMLODIPINE BESYLATE 5 MG PO TABS: 5.0000 mg | ORAL_TABLET | Freq: Every day | ORAL | 0 refills | Status: DC

## 2019-06-22 ENCOUNTER — Other Ambulatory Visit: Payer: Self-pay | Admitting: Internal Medicine

## 2019-06-22 DIAGNOSIS — E1169 Type 2 diabetes mellitus with other specified complication: Secondary | ICD-10-CM

## 2019-07-15 ENCOUNTER — Other Ambulatory Visit (INDEPENDENT_AMBULATORY_CARE_PROVIDER_SITE_OTHER): Payer: BC Managed Care – PPO

## 2019-07-15 ENCOUNTER — Other Ambulatory Visit: Payer: Self-pay | Admitting: Internal Medicine

## 2019-07-15 ENCOUNTER — Other Ambulatory Visit: Payer: Self-pay

## 2019-07-15 DIAGNOSIS — E0500 Thyrotoxicosis with diffuse goiter without thyrotoxic crisis or storm: Secondary | ICD-10-CM

## 2019-07-15 LAB — T3, FREE: T3, Free: 3 pg/mL (ref 2.3–4.2)

## 2019-07-15 LAB — T4, FREE: Free T4: 0.59 ng/dL — ABNORMAL LOW (ref 0.60–1.60)

## 2019-07-15 LAB — TSH: TSH: 0.02 u[IU]/mL — ABNORMAL LOW (ref 0.35–4.50)

## 2019-07-15 MED ORDER — METHIMAZOLE 5 MG PO TABS: 5.0000 mg | ORAL_TABLET | Freq: Every day | ORAL | 3 refills | Status: DC

## 2019-08-12 ENCOUNTER — Other Ambulatory Visit: Payer: Self-pay | Admitting: Internal Medicine

## 2019-09-08 ENCOUNTER — Other Ambulatory Visit: Payer: Self-pay | Admitting: Internal Medicine

## 2019-09-08 DIAGNOSIS — E119 Type 2 diabetes mellitus without complications: Secondary | ICD-10-CM

## 2019-09-09 ENCOUNTER — Other Ambulatory Visit (INDEPENDENT_AMBULATORY_CARE_PROVIDER_SITE_OTHER): Payer: BC Managed Care – PPO

## 2019-09-09 ENCOUNTER — Other Ambulatory Visit: Payer: Self-pay

## 2019-09-09 DIAGNOSIS — E0500 Thyrotoxicosis with diffuse goiter without thyrotoxic crisis or storm: Secondary | ICD-10-CM | POA: Diagnosis not present

## 2019-09-09 LAB — T4, FREE: Free T4: 0.61 ng/dL (ref 0.60–1.60)

## 2019-09-09 LAB — T3, FREE: T3, Free: 3 pg/mL (ref 2.3–4.2)

## 2019-09-09 LAB — TSH: TSH: 4.83 u[IU]/mL — ABNORMAL HIGH (ref 0.35–4.50)

## 2019-09-10 ENCOUNTER — Ambulatory Visit (INDEPENDENT_AMBULATORY_CARE_PROVIDER_SITE_OTHER): Payer: BC Managed Care – PPO | Admitting: Internal Medicine

## 2019-09-10 ENCOUNTER — Encounter: Payer: Self-pay | Admitting: Internal Medicine

## 2019-09-10 VITALS — BP 126/84 | HR 102 | Ht 63.0 in | Wt 198.2 lb

## 2019-09-10 DIAGNOSIS — E1169 Type 2 diabetes mellitus with other specified complication: Secondary | ICD-10-CM

## 2019-09-10 DIAGNOSIS — E0500 Thyrotoxicosis with diffuse goiter without thyrotoxic crisis or storm: Secondary | ICD-10-CM

## 2019-09-10 DIAGNOSIS — E785 Hyperlipidemia, unspecified: Secondary | ICD-10-CM | POA: Diagnosis not present

## 2019-09-10 DIAGNOSIS — E119 Type 2 diabetes mellitus without complications: Secondary | ICD-10-CM | POA: Diagnosis not present

## 2019-09-10 LAB — POCT GLYCOSYLATED HEMOGLOBIN (HGB A1C): Hemoglobin A1C: 5.5 % (ref 4.0–5.6)

## 2019-09-10 MED ORDER — AMLODIPINE BESYLATE 5 MG PO TABS: 5.0000 mg | ORAL_TABLET | Freq: Every day | ORAL | 1 refills | Status: DC

## 2019-09-10 MED ORDER — PRAVASTATIN SODIUM 20 MG PO TABS: ORAL_TABLET | ORAL | 3 refills | Status: DC

## 2019-09-10 MED ORDER — METFORMIN HCL ER 500 MG PO TB24: ORAL_TABLET | ORAL | 3 refills | Status: DC

## 2019-09-10 NOTE — Addendum Note (Signed)
Addended by: Jimmye Norman on: 09/10/2019 02:00 PM   Modules accepted: Orders

## 2019-09-10 NOTE — Patient Instructions (Addendum)
Please decrease the methimazole to 2.5 mg daily.  Please return for labs in 1.5 months.  Continue Toprol XL 50 mg daily.  Please continue: - Metformin ER 500 mg 2x a day - Ozempic 0.5 mg weekly in a.m.  Please return in 6 months with your sugar log.

## 2019-09-10 NOTE — Progress Notes (Signed)
Patient ID: Ana [name], female   DOB: 20-Apr-1980, 39 y.o.   MRN: [id number]  This visit occurred during the SARS-CoV-2 public health emergency.  Safety protocols were in place, including screening questions prior to the visit, additional usage of staff PPE, and extensive cleaning of exam room while observing appropriate contact time as indicated for disinfecting solutions.   HPI: Ana [name] is a 39 y.o.-year-old female, returning for f/u DM2, dx summer 2014, non-insulin-dependent, controlled, without long-term complications and also Graves ds. Last visit 4 months ago.   Reviewed HbA1c levels: Lab Results  Component Value Date   HGBA1C 5.6 04/24/2019   HGBA1C 5.4 10/16/2018   HGBA1C 6.8 (A) 06/05/2018  Previously 10.5% (10/2012).  Previously 6.5%.   Patient is on a regimen of: - Metformin ER 1000 >> 500 mg 2x a day. - Ozempic 0.5 mg weekly in a.m. >> had nausea, abd pain, HA in first 2 weeks but they resolved. Stopped Bydureon due to reaction to the injection sites. We had to stop Invokana 100 mg b/c repeated yeast inf. (09/26/2013) She was on JanuMet when she was prediabetic >> tolerated it [name].   She has a One Touch Ultra meter.  She is checking sugars 0 to once a day: - am: 90-100 >> 91-99 >> 93-95 >> 90s >> 90s >> 90s - 2h after b'fast: 98-118 >> n/c - lunch:  90-120 >> n/c - 2h after lunch:  150-160 >> 121-130 >> 120s-130s >>  up to 130 - dinner  90s-130s >> n/c - 2h after dinner:up to 130 >> 150-160 >> 120-130 >> 148 >> up to 130 Highest: 150s >> 148 (Christmas).  -No CKD, last BUN/creatinine:  Lab Results  Component Value Date   BUN 15 10/26/2018   CREATININE 0.83 10/26/2018   Lab Results  Component Value Date   MICRALBCREAT 2.0 10/26/2018   MICRALBCREAT 3.4 12/11/2017   MICRALBCREAT 10.3 08/03/2016   MICRALBCREAT 4.0 07/10/2015   MICRALBCREAT 5.7 02/05/2014   MICRALBCREAT 9.4 07/11/2013   MICRALBCREAT 36.3 (H) 03/07/2013   Last ACR: Normal in  07/2016; but had proteinuria after pregnancy 2010.  -+ HL. Last lipids: Lab Results  Component Value Date   CHOL 153 04/24/2019   HDL 36.10 (L) 04/24/2019   LDLCALC 89 04/24/2019   LDLDIRECT 97.0 06/05/2018   TRIG 142.0 04/24/2019   CHOLHDL 4 04/24/2019  On pravastatin. - Last eye exam 04/2018: No DR -She denies numbness and tingling in her feet.  Graves ds.:  We reviewed her TFTs: Lab Results  Component Value Date   TSH 4.83 (H) 09/09/2019   TSH 0.02 (L) 07/15/2019   TSH <0.01 (L) 04/24/2019   TSH 0.57 10/26/2018   TSH 1.19 06/05/2018   FREET4 0.61 09/09/2019   FREET4 0.59 (L) 07/15/2019   FREET4 1.89 (H) 04/24/2019   FREET4 0.71 10/26/2018   FREET4 0.75 06/05/2018   Lab Results  Component Value Date   T3FREE 3.0 09/09/2019   T3FREE 3.0 07/15/2019   T3FREE 6.0 (H) 04/24/2019   T3FREE 3.2 10/26/2018   T3FREE 3.2 06/05/2018   T3FREE 3.5 01/30/2018   T3FREE 4.1 12/11/2017   T3FREE 3.5 07/12/2017   T3FREE 3.4 04/06/2017   T3FREE 3.3 12/01/2016   At last check, her Graves' antibodies are undetectable: Lab Results  Component Value Date   TSI <89 01/30/2018   TSI 210 (H) 04/06/2017   TSI 152 (H) 02/11/2016   TSI 188 (H) 12/18/2013   We initially started methimazole 10 mg twice  a day, and subsequently tapered it off and stopped it completely in 09/2015.  However, her TSH started to decrease and we had to restart methimazole in 07/2016.  Since then, we were again able to decrease the medication and she has been taking 2.5 mg daily since 06/2017.  In 11/2017, we were able to stop methimazole.  In 04/2019, we had to restart methimazole at 5 mg twice a day.  Dose was decreased to 5 mg once a day in 06/2019.  She continues on this dose now.  She also has a history of PCOS, HTN, vitamin D deficiency.  ROS: Constitutional: + weight gain/no weight loss, no fatigue, no subjective hyperthermia, no subjective hypothermia Eyes: no blurry vision, no xerophthalmia ENT: no sore  throat, no nodules palpated in neck, no dysphagia, no odynophagia, no hoarseness Cardiovascular: no CP/no SOB/+ chronic palpitations/no leg swelling Respiratory: no cough/no SOB/no wheezing Gastrointestinal: no N/no V/no D/no C/no acid reflux Musculoskeletal: no muscle aches/no joint aches Skin: no rashes, no hair loss Neurological: no tremors/no numbness/no tingling/no dizziness  I reviewed pt's medications, allergies, PMH, social hx, family hx, and changes were documented in the history of present illness. Otherwise, unchanged from my initial visit note.  Past Medical History:  Diagnosis Date  . DM2 (diabetes mellitus, type 2) (HCC) 01/2013 dx  . Hypertension   . Other and unspecified hyperlipidemia   . PCOS (polycystic ovarian syndrome)    Past Surgical History:  Procedure Laterality Date  . CESAREAN SECTION  2010   Social History   Socioeconomic History  . Marital status: Married    Spouse name: Not on file  . Number of children: Not on file  . Years of education: Not on file  . Highest education level: Not on file  Occupational History  . Not on file  Tobacco Use  . Smoking status: Never Smoker  . Smokeless tobacco: Never Used  Substance and Sexual Activity  . Alcohol use: No  . Drug use: No  . Sexual activity: Yes    Partners: Male  Other Topics Concern  . Not on file  Social History Narrative   Regular exercise: no   Caffeine use: 2 soda's daily   Lives with spouse and son   Works at [employer] as Psychologist, occupational   Social Determinants of Corporate investment banker Strain:   . Difficulty of Paying Living Expenses:   Food Insecurity:   . Worried About Programme researcher, broadcasting/film/video in the Last Year:   . Barista in the Last Year:   Transportation Needs:   . Freight forwarder (Medical):   Marland Kitchen Lack of Transportation (Non-Medical):   Physical Activity:   . Days of Exercise per Week:   . Minutes of Exercise per Session:   Stress:   . Feeling of Stress :   Social  Connections:   . Frequency of Communication with Friends and Family:   . Frequency of Social Gatherings with Friends and Family:   . Attends Religious Services:   . Active Member of Clubs or Organizations:   . Attends Banker Meetings:   Marland Kitchen Marital Status:   Intimate Partner Violence:   . Fear of Current or Ex-Partner:   . Emotionally Abused:   Marland Kitchen Physically Abused:   . Sexually Abused:    Current Outpatient Medications on File Prior to Visit  Medication Sig Dispense Refill  . amLODipine (NORVASC) 5 MG tablet Take 1 tablet (5 mg total) by mouth daily. Overdue  for Annual appt w/labs must see provider for future refills 30 tablet 0  . glucose blood test strip Use 2 times day as instructed - One Touch Ultra 100 each 11  . ibuprofen (ADVIL) 800 MG tablet TAKE 1 TABLET BY MOUTH EVERY 8 HOURS AS NEEDED 90 tablet 1  . levonorgestrel (MIRENA) 20 MCG/24HR IUD 1 Intra Uterine Device (1 each total) by Intrauterine route once. Placed 07/19/13 - gyn 1 each 0  . metFORMIN (GLUCOPHAGE-XR) 500 MG 24 hr tablet TAKE 2 TABLETS TWICE A DAY BY MOUTH. 360 tablet 0  . methimazole (TAPAZOLE) 5 MG tablet Take 1 tablet (5 mg total) by mouth daily. 180 tablet 3  . metoprolol succinate (TOPROL-XL) 25 MG 24 hr tablet TAKE 2 TABLETS BY MOUTH EVERY DAY 60 tablet 11  . OZEMPIC, 0.25 OR 0.5 MG/DOSE, 2 MG/1.5ML SOPN INJECT 0.5 MG INTO THE SKIN ONCE A WEEK. 3 pen 5  . pravastatin (PRAVACHOL) 20 MG tablet TAKE 1 TABLET BY MOUTH EVERYDAY AT BEDTIME 90 tablet 1  . topiramate (TOPAMAX) 25 MG tablet Take 2 tablets (50 mg total) by mouth daily. 180 tablet 3  . Vitamin D, Ergocalciferol, (DRISDOL) 1.25 MG (50000 UT) CAPS capsule TAKE 1 CAPSULE (50,000 UNITS) BY MOUTH EVERY 7 DAYS 12 capsule 0   No current facility-administered medications on file prior to visit.   Allergies  Allergen Reactions  . Invokana [Canagliflozin]     Recurrent yeast infections   Family History  Problem Relation Age of Onset  . Diabetes  Mother        Type II  . Thyroid disease Mother   . Hypertension Mother   . Hyperlipidemia Mother   . Diabetes Father        TYPE II  . Hypertension Father   . Hyperlipidemia Father   . Heart disease Father        CAD with stent  . Hyperlipidemia Maternal Grandmother   . Hypertension Maternal Grandmother   . Obesity Maternal Grandmother   . Diabetes Maternal Grandmother   . Cancer Paternal Grandmother 33       colon cancer  . Diabetes Paternal Grandmother    PE: BP 126/84 (BP Location: Left Arm, Patient Position: Sitting, Cuff Size: Normal)   Pulse (!) 102   Ht 5\' 3"  (1.6 m)   Wt 198 lb 4 oz (89.9 kg)   SpO2 99%   BMI 35.12 kg/m  Body mass index is 35.12 kg/m.  Wt Readings from Last 3 Encounters:  09/10/19 198 lb 4 oz (89.9 kg)  04/24/19 192 lb (87.1 kg)  10/16/18 197 lb (89.4 kg)   Constitutional: overweight, in NAD Eyes: PERRLA, EOMI, no exophthalmos ENT: moist mucous membranes, no thyromegaly, no cervical lymphadenopathy Cardiovascular: tachycardia, RR, No MRG Respiratory: CTA B Gastrointestinal: abdomen soft, NT, ND, BS+ Musculoskeletal: no deformities, strength intact in all 4 Skin: moist, warm, no rashes Neurological: no tremor with outstretched hands, DTR normal in all 4  ASSESSMENT: 1. DM2, non-insulin-dependent, uncontrolled, without complications  2. Graves ds - 03/12/2014: Thyroid uptake and scan: Consistent with Graves' disease: The 24 hr uptake by the thyroid gland is 65%. Normal 24 hr uptake is 10-30%. On thyroid imaging, the thyroid activity appears diffusely increased. No focal hot or cold nodules are identified.  3.  Hyperlipidemia  4. Obesity class I  PLAN:  1. Patient with controlled diabetes on Metformin and weekly GLP-1 receptor agonist.  We are mostly using Ozempic for controlling weight and only marginally for her  diabetes control. -At last visit sugars were excellent, even after the holidays.  We did not change her regimen.  HbA1c at  that time was 5.6%. -At this visit sugars are [name] controlled.  No need to change her regimen. -I advised her to: Patient Instructions  Please decrease the methimazole to 2.5 mg daily.  Please return for labs in 1.5 months.  Continue Toprol XL 50 mg daily.  Please continue: - Metformin ER 500 mg 2x a day - Ozempic 0.5 mg weekly in a.m.  Please return in 6 months with your sugar log.   - we checked her HbA1c: 5.5% (lower) - advised to check sugars at different times of the day - 1x a day, rotating check times - advised for yearly eye exams >> she is not UTD - return to clinic in 6 months  2. Graves ds  -Patient previously on methimazole, but able to come off in the past.  However, Graves' disease recurred afterwards.  We restarted the methimazole and again was able to stop in 11/2017.  TFTs remained normal, however, at last visit, the tests were again thyrotoxic and we started her on methimazole 5 mg twice a day.  TFTs improved in 06/2019 and we decreased the methimazole dose to 5 mg daily.  She had labs yesterday and the TSH was slightly high, so for now, we will decrease the methimazole dose to 2.5 mg daily. Lab Results  Component Value Date   TSH 4.83 (H) 09/09/2019  -We will recheck the test in 1.5 months. -Since she had to restart methimazole twice in the past, we discussed about continuing on methimazole low-dose for a longer time. -We will continue Toprol-XL 50 mg daily.  3.  Hyperlipidemia -Reviewed latest lipid panel from 04/2019: LDL at goal, triglycerides at goal, HDL slightly low: Lab Results  Component Value Date   CHOL 153 04/24/2019   HDL 36.10 (L) 04/24/2019   LDLCALC 89 04/24/2019   LDLDIRECT 97.0 06/05/2018   TRIG 142.0 04/24/2019   CHOLHDL 4 04/24/2019  -Continues pravastatin 20 without side effects - refilled  4.  Obesity class I -Failed Belviq, however, this is now taken off the market -She developed injection site reaction to Bydureon in the past -She  was doing [name] on Qsymia but this was not approved by her insurance -We will continue Ozempic which should also help with weight loss -She lost weight: 13+5 pounds before last 2 visits, now gained approximately 6 pounds back   [name], MD PhD [facility]

## 2019-09-16 ENCOUNTER — Other Ambulatory Visit: Payer: Self-pay | Admitting: Internal Medicine

## 2019-09-18 ENCOUNTER — Other Ambulatory Visit: Payer: Self-pay | Admitting: Internal Medicine

## 2019-09-22 ENCOUNTER — Other Ambulatory Visit: Payer: Self-pay | Admitting: Internal Medicine

## 2019-10-24 ENCOUNTER — Ambulatory Visit: Payer: Self-pay | Admitting: Internal Medicine

## 2019-11-11 ENCOUNTER — Telehealth: Payer: Self-pay | Admitting: Skilled Nursing Facility1

## 2019-11-11 NOTE — Telephone Encounter (Signed)
Opened in error

## 2020-03-01 ENCOUNTER — Other Ambulatory Visit: Payer: Self-pay | Admitting: Internal Medicine

## 2020-03-01 DIAGNOSIS — E119 Type 2 diabetes mellitus without complications: Secondary | ICD-10-CM

## 2020-03-10 DIAGNOSIS — Z6838 Body mass index (BMI) 38.0-38.9, adult: Secondary | ICD-10-CM | POA: Diagnosis not present

## 2020-03-10 DIAGNOSIS — N760 Acute vaginitis: Secondary | ICD-10-CM | POA: Diagnosis not present

## 2020-03-10 DIAGNOSIS — Z01419 Encounter for gynecological examination (general) (routine) without abnormal findings: Secondary | ICD-10-CM | POA: Diagnosis not present

## 2020-03-16 ENCOUNTER — Other Ambulatory Visit: Payer: Self-pay | Admitting: Internal Medicine

## 2020-03-19 ENCOUNTER — Other Ambulatory Visit: Payer: Self-pay

## 2020-03-19 ENCOUNTER — Ambulatory Visit: Payer: BC Managed Care – PPO | Admitting: Internal Medicine

## 2020-03-19 ENCOUNTER — Encounter: Payer: Self-pay | Admitting: Internal Medicine

## 2020-03-19 VITALS — BP 132/80 | HR 106 | Ht 62.0 in | Wt 210.8 lb

## 2020-03-19 DIAGNOSIS — E1169 Type 2 diabetes mellitus with other specified complication: Secondary | ICD-10-CM | POA: Diagnosis not present

## 2020-03-19 DIAGNOSIS — Z6836 Body mass index (BMI) 36.0-36.9, adult: Secondary | ICD-10-CM

## 2020-03-19 DIAGNOSIS — E0500 Thyrotoxicosis with diffuse goiter without thyrotoxic crisis or storm: Secondary | ICD-10-CM | POA: Diagnosis not present

## 2020-03-19 DIAGNOSIS — E119 Type 2 diabetes mellitus without complications: Secondary | ICD-10-CM

## 2020-03-19 DIAGNOSIS — E785 Hyperlipidemia, unspecified: Secondary | ICD-10-CM | POA: Diagnosis not present

## 2020-03-19 LAB — LIPID PANEL
Cholesterol: 184 mg/dL (ref 0–200)
HDL: 41.3 mg/dL (ref >39.00)
LDL Cholesterol: 117 mg/dL — ABNORMAL HIGH (ref 0–99)
NonHDL: 142.31
Total CHOL/HDL Ratio: 4
Triglycerides: 126 mg/dL (ref 0.0–149.0)
VLDL: 25.2 mg/dL (ref 0.0–40.0)

## 2020-03-19 LAB — MICROALBUMIN / CREATININE URINE RATIO
Creatinine,U: 186.9 mg/dL
Microalb Creat Ratio: 2.3 mg/g (ref 0.0–30.0)
Microalb, Ur: 4.2 mg/dL — ABNORMAL HIGH (ref 0.0–1.9)

## 2020-03-19 LAB — POCT GLYCOSYLATED HEMOGLOBIN (HGB A1C): Hemoglobin A1C: 5.7 % — AB (ref 4.0–5.6)

## 2020-03-19 LAB — T4, FREE: Free T4: 0.67 ng/dL (ref 0.60–1.60)

## 2020-03-19 LAB — TSH: TSH: 1.69 u[IU]/mL (ref 0.35–4.50)

## 2020-03-19 LAB — T3, FREE: T3, Free: 3.2 pg/mL (ref 2.3–4.2)

## 2020-03-19 MED ORDER — OZEMPIC (1 MG/DOSE) 4 MG/3ML ~~LOC~~ SOPN: 1.0000 mg | PEN_INJECTOR | SUBCUTANEOUS | 3 refills | Status: DC

## 2020-03-19 NOTE — Patient Instructions (Addendum)
Please continue: - Metformin ER 500 mg 2x a day  Increase: - Ozempic 1 mg weekly in a.m.  Please also continue: - Methimazole 2.5 mg daily. - Toprol XL 50 mg daily.  Please stop at the lab.  We will order a thyroid uptake test.  . For the Uptake, please call: [facility]: [phone] . For the RAI treatment, please call: [facility]: [phone]  5 days before the scan, stop Methimazole and resume it right after the scan.  5 days before the RAI treatment, stop Methimazole and DO NOT resume it.  4 weeks after the treatment, please come back for labs.  Please return in 6 months with your sugar log.

## 2020-03-19 NOTE — Progress Notes (Addendum)
Patient ID: RENALDA LOCKLIN, female   DOB: 12/11/1980, 39 y.o.   MRN: [id number]  This visit occurred during the SARS-CoV-2 public health emergency.  Safety protocols were in place, including screening questions prior to the visit, additional usage of staff PPE, and extensive cleaning of exam room while observing appropriate contact time as indicated for disinfecting solutions.   HPI: DERIN MATTHES is a 39 y.o.-year-old female, returning for f/u DM2, dx summer 2014, non-insulin-dependent, controlled, without long-term complications and also Graves ds. Last visit 6 months ago.   Reviewed HbA1c levels: Lab Results  Component Value Date   HGBA1C 5.5 09/10/2019   HGBA1C 5.6 04/24/2019   HGBA1C 5.4 10/16/2018  Previously 10.5% (10/2012).  Previously 6.5%.   Patient is on a regimen of: - Metformin ER 1000 >> 500 mg twice a day - Ozempic 0.5 mg weekly in a.m. >> had nausea, abd pain, HA initially, then resolved Stopped Bydureon due to reaction to the injection sites. We had to stop Invokana 100 mg b/c repeated yeast inf. (09/26/2013) She was on JanuMet when she was prediabetic >> tolerated it well.   She has a One Touch Ultra meter.  She is checking sugars 0-1x a day: - am: 91-99 >> 93-95 >> 90s >> 90s >> 90s >> 90s - 2h after b'fast: 98-118 >> n/c - lunch:  90-120 >> n/c - 2h after lunch: 121-130 >> 120s-130s >>  up to 130 >> <130 - dinner  90s-130s >> n/c - 2h after dinner: 120-130 >> 148 >> up to 130 >> <130 Highest: 150s >> 148 (Christmas).  -No CKD, last BUN/creatinine:  Lab Results  Component Value Date   BUN 15 10/26/2018   CREATININE 0.83 10/26/2018   Normal ACR: Lab Results  Component Value Date   MICRALBCREAT 2.0 10/26/2018   MICRALBCREAT 3.4 12/11/2017   MICRALBCREAT 10.3 08/03/2016   MICRALBCREAT 4.0 07/10/2015   MICRALBCREAT 5.7 02/05/2014   MICRALBCREAT 9.4 07/11/2013   MICRALBCREAT 36.3 (H) 03/07/2013   She had proteinuria after pregnancy 2010.    -+ HL. Last lipids: Lab Results  Component Value Date   CHOL 153 04/24/2019   HDL 36.10 (L) 04/24/2019   LDLCALC 89 04/24/2019   LDLDIRECT 97.0 06/05/2018   TRIG 142.0 04/24/2019   CHOLHDL 4 04/24/2019  On pravastatin 20.  - Last eye exam 04/2018: No DR  - no numbness and tingling in her feet.  Graves ds.:  Reviewed her treatment course: 02/2014: started MMI 10 mg twice a day MMI tapered down 09/2015: stopped MMI 07/2016: restarted MMI 5 mg daily 06/2017: MMI decrease to 2.5 mg daily  11/2017: MMI stopped again 04/2019: MMI restarted at 5 mg twice a day 06/2019: MMI decrease to 5 mg daily 08/2019: MMI decrease to 2.5 mg daily -did not come back for labs...  Reviewed her TFTs: Lab Results  Component Value Date   TSH 4.83 (H) 09/09/2019   TSH 0.02 (L) 07/15/2019   TSH <0.01 (L) 04/24/2019   TSH 0.57 10/26/2018   TSH 1.19 06/05/2018   FREET4 0.61 09/09/2019   FREET4 0.59 (L) 07/15/2019   FREET4 1.89 (H) 04/24/2019   FREET4 0.71 10/26/2018   FREET4 0.75 06/05/2018   Lab Results  Component Value Date   T3FREE 3.0 09/09/2019   T3FREE 3.0 07/15/2019   T3FREE 6.0 (H) 04/24/2019   T3FREE 3.2 10/26/2018   T3FREE 3.2 06/05/2018   T3FREE 3.5 01/30/2018   T3FREE 4.1 12/11/2017   T3FREE 3.5 07/12/2017   T3FREE  3.4 04/06/2017   T3FREE 3.3 12/01/2016   At last check, her Graves' antibodies are undetectable: Lab Results  Component Value Date   TSI <89 01/30/2018   TSI 210 (H) 04/06/2017   TSI 152 (H) 02/11/2016   TSI 188 (H) 12/18/2013   She also has a history of PCOS, HTN, vitamin D deficiency.  ROS: Constitutional: no weight gain/no weight loss, no fatigue, no subjective hyperthermia, no subjective hypothermia Eyes: no blurry vision, no xerophthalmia ENT: no sore throat, no nodules palpated in neck, no dysphagia, no odynophagia, no hoarseness Cardiovascular: no CP/no SOB/no palpitations/no leg swelling Respiratory: no cough/no SOB/no  wheezing Gastrointestinal: no N/no V/no D/no C/no acid reflux Musculoskeletal: no muscle aches/no joint aches Skin: no rashes, no hair loss Neurological: no tremors/no numbness/no tingling/no dizziness  I reviewed pt's medications, allergies, PMH, social hx, family hx, and changes were documented in the history of present illness. Otherwise, unchanged from my initial visit note.  Past Medical History:  Diagnosis Date  . DM2 (diabetes mellitus, type 2) (HCC) 01/2013 dx  . Hypertension   . Other and unspecified hyperlipidemia   . PCOS (polycystic ovarian syndrome)    Past Surgical History:  Procedure Laterality Date  . CESAREAN SECTION  2010   Social History   Socioeconomic History  . Marital status: Married    Spouse name: Not on file  . Number of children: Not on file  . Years of education: Not on file  . Highest education level: Not on file  Occupational History  . Not on file  Tobacco Use  . Smoking status: Never Smoker  . Smokeless tobacco: Never Used  Substance and Sexual Activity  . Alcohol use: No  . Drug use: No  . Sexual activity: Yes    Partners: Male  Other Topics Concern  . Not on file  Social History Narrative   Regular exercise: no   Caffeine use: 2 soda's daily   Lives with spouse and son   Works at [employer] as Psychologist, occupational   Social Determinants of Corporate investment banker Strain:   . Difficulty of Paying Living Expenses: Not on file  Food Insecurity:   . Worried About Programme researcher, broadcasting/film/video in the Last Year: Not on file  . Ran Out of Food in the Last Year: Not on file  Transportation Needs:   . Lack of Transportation (Medical): Not on file  . Lack of Transportation (Non-Medical): Not on file  Physical Activity:   . Days of Exercise per Week: Not on file  . Minutes of Exercise per Session: Not on file  Stress:   . Feeling of Stress : Not on file  Social Connections:   . Frequency of Communication with Friends and Family: Not on file  . Frequency of  Social Gatherings with Friends and Family: Not on file  . Attends Religious Services: Not on file  . Active Member of Clubs or Organizations: Not on file  . Attends Banker Meetings: Not on file  . Marital Status: Not on file  Intimate Partner Violence:   . Fear of Current or Ex-Partner: Not on file  . Emotionally Abused: Not on file  . Physically Abused: Not on file  . Sexually Abused: Not on file   Current Outpatient Medications on File Prior to Visit  Medication Sig Dispense Refill  . amLODipine (NORVASC) 5 MG tablet TAKE 1 TABLET BY MOUTH EVERY DAY NEEDS APPT FOR REFILLS 30 tablet 0  . glucose blood test  strip Use 2 times day as instructed - One Touch Ultra 100 each 11  . ibuprofen (ADVIL) 800 MG tablet TAKE 1 TABLET BY MOUTH EVERY 8 HOURS AS NEEDED 90 tablet 1  . levonorgestrel (MIRENA) 20 MCG/24HR IUD 1 Intra Uterine Device (1 each total) by Intrauterine route once. Placed 07/19/13 - gyn 1 each 0  . metFORMIN (GLUCOPHAGE-XR) 500 MG 24 hr tablet TAKE 2 TABS TWICE A DAY BY MOUTH 180 tablet 1  . methimazole (TAPAZOLE) 5 MG tablet Take 1 tablet (5 mg total) by mouth daily. 180 tablet 3  . metoprolol succinate (TOPROL-XL) 25 MG 24 hr tablet TAKE 2 TABLETS BY MOUTH EVERY DAY 60 tablet 11  . OZEMPIC, 0.25 OR 0.5 MG/DOSE, 2 MG/1.5ML SOPN INJECT 0.5 MG INTO THE SKIN ONCE A WEEK. 3 pen 5  . pravastatin (PRAVACHOL) 20 MG tablet Take 1 tablet daily 90 tablet 3  . topiramate (TOPAMAX) 25 MG tablet Take 2 tablets (50 mg total) by mouth daily. 180 tablet 3   No current facility-administered medications on file prior to visit.   Allergies  Allergen Reactions  . Invokana [Canagliflozin]     Recurrent yeast infections   Family History  Problem Relation Age of Onset  . Diabetes Mother        Type II  . Thyroid disease Mother   . Hypertension Mother   . Hyperlipidemia Mother   . Diabetes Father        TYPE II  . Hypertension Father   . Hyperlipidemia Father   . Heart disease  Father        CAD with stent  . Hyperlipidemia Maternal Grandmother   . Hypertension Maternal Grandmother   . Obesity Maternal Grandmother   . Diabetes Maternal Grandmother   . Cancer Paternal Grandmother [id number]       colon cancer  . Diabetes Paternal Grandmother    PE: BP 132/80   Pulse (!) 106   Ht 5\' 2"  (1.575 m)   Wt 210 lb 12.8 oz (95.6 kg)   SpO2 99%   BMI 38.56 kg/m  Body mass index is 38.56 kg/m.  Wt Readings from Last 3 Encounters:  03/19/20 210 lb 12.8 oz (95.6 kg)  09/10/19 198 lb 4 oz (89.9 kg)  04/24/19 192 lb (87.1 kg)   Constitutional: overweight, in NAD Eyes: PERRLA, EOMI, no exophthalmos ENT: moist mucous membranes, no thyromegaly, no cervical lymphadenopathy Cardiovascular: tachycardia, RR, No MRG Respiratory: CTA B Gastrointestinal: abdomen soft, NT, ND, BS+ Musculoskeletal: no deformities, strength intact in all 4 Skin: moist, warm, no rashes Neurological: no tremor with outstretched hands, DTR normal in all 4  ASSESSMENT: 1. DM2, non-insulin-dependent, uncontrolled, without complications  2. Graves ds - 03/12/2014: Thyroid uptake and scan: Consistent with Graves' disease: The 24 hr uptake by the thyroid gland is 65%. Normal 24 hr uptake is 10-30%. On thyroid imaging, the thyroid activity appears diffusely increased. No focal hot or cold nodules are identified.  3.  Hyperlipidemia  4. Obesity class II  PLAN:  1. Patient with well-controlled type 2 diabetes on Metformin and weekly GLP-1 receptor agonist.  We are also using Ozempic for controlling weight and only marginally for her diabetes control.  Latest HbA1c was excellent, at 5.9%, slightly lower, at last visit. At that time, sugars were very well controlled so we did not change her regimen.  She is tolerating Ozempic well. -Sugars at home are still at goal, between 90s and 130. -We will go ahead and increase the  Ozempic to help with her weight since she gained >10 pounds since last visit. -I  advised her to: Patient Instructions  Please continue: - Metformin ER 500 mg 2x a day  Increase: - Ozempic 1 mg weekly in a.m.  Please also continue: - Methimazole 2.5 mg daily. - Toprol XL 50 mg daily.  Please stop at the lab.  We will order a thyroid uptake test.  . For the Uptake, please call: [facility]: [phone] . For the RAI treatment, please call: [facility]: [phone]  5 days before the scan, stop Methimazole and resume it right after the scan.  5 days before the RAI treatment, stop Methimazole and DO NOT resume it.  4 weeks after the treatment, please come back for labs.  Please return in 6 months with your sugar log.   - we checked her HbA1c: 5.7% (slightly higher) - advised to check sugars at different times of the day - 1x a day, rotating check times - advised for yearly eye exams >> she is UTD - return to clinic in 6 months  2. Graves ds  -Patient with recurrent Graves' disease after trying to stop methimazole twice.   -At last visit, on 5 mg daily of methimazole, her TSH was elevated (see below) so we decreased the dose to 2.5 mg daily. Lab Results  Component Value Date   TSH 4.83 (H) 09/09/2019  -We will recheck her TFTs and will also add a TSI level today -Continue Toprol-XL 50 mg daily -she is still tachycardic today -Since we had to restart methimazole twice in the past, we discussed about continuing on methimazole low dose for longer time -At this visit, however, she expresses her wish to undergo RAI treatment.  We will go ahead and obtain another uptake now and then proceed with RAI treatment.  I advised her to hold methimazole for 5 days prior to the thyroid uptake and also 5days RAI treatment.  I advised her not to restart the methimazole after the treatment.  She will need to return for labs 4 weeks after that. -Discussed about what to expect after RAI treatment and the fact that we will most likely need to start levothyroxine for post ablative  hypothyroidism. -Discussed about how to take levothyroxine correctly.  3.  Hyperlipidemia -Reviewed latest lipid panel from 04/2019: LDL at goal, triglycerides also at goal, HDL low: Lab Results  Component Value Date   CHOL 153 04/24/2019   HDL 36.10 (L) 04/24/2019   LDLCALC 89 04/24/2019   LDLDIRECT 97.0 06/05/2018   TRIG 142.0 04/24/2019   CHOLHDL 4 04/24/2019  -Continues pravastatin 20 without side effects -Recheck lipids today  4.  Obesity class II -Previously on Belviq (but no significant weight loss with this), however, this is now taken off the market -She was doing well on Qsymia but this was not approved by her insurance -She developed injection site reaction to Bydureon in the past -She initially lost a significant amount of weight (approximately 18 pounds since starting Ozempic) but then gained 6 pounds before last visit and 12 lbs since then. -At this visit, will increase Ozempic, mostly help with weight loss.  Component     Latest Ref Rng & Units 03/19/2020  Glucose     65 - 99 mg/dL 81  BUN     7 - 25 mg/dL 14  Creatinine     9.62 - 1.10 mg/dL 2.29  GFR, Est Non African American     > OR = 60 mL/min/1.31m2 72  GFR, Est African American     > OR = 60 mL/min/1.8m2 84  BUN/Creatinine Ratio     6 - 22 (calc) NOT APPLICABLE  Sodium     135 - 146 mmol/L 140  Potassium     3.5 - 5.3 mmol/L 4.3  Chloride     98 - 110 mmol/L 106  CO2     20 - 32 mmol/L 21  Calcium     8.6 - 10.2 mg/dL 9.2  Total Protein     6.1 - 8.1 g/dL 7.1  Albumin MSPROF     3.6 - 5.1 g/dL 3.9  Globulin     1.9 - 3.7 g/dL (calc) 3.2  AG Ratio     1.0 - 2.5 (calc) 1.2  Total Bilirubin     0.2 - 1.2 mg/dL 0.4  Alkaline phosphatase (APISO)     31 - 125 U/L 64  AST     10 - 30 U/L 14  ALT     6 - 29 U/L 9  Cholesterol     0 - 200 mg/dL 960  Triglycerides     0 - 149 mg/dL 454.0  HDL Cholesterol     >39.00 mg/dL 98.11  VLDL     0.0 - 91.4 mg/dL 78.2  LDL (calc)     0 - 99  mg/dL 956 (H)  Total CHOL/HDL Ratio      4  NonHDL      142.31  Microalb, Ur     0.0 - 1.9 mg/dL 4.2 (H)  Creatinine,U     mg/dL 213.0  MICROALB/CREAT RATIO     0.0 - 30.0 mg/g 2.3  T4,Free(Direct)     0.60 - 1.60 ng/dL 8.65  Triiodothyronine,Free,Serum     2.3 - 4.2 pg/mL 3.2  TSH     0.35 - 4.50 uIU/mL 1.69  Hemoglobin A1C     4.0 - 5.6 % 5.7 (A)  TSI     <140 % baseline 221 (H)  LDL is slightly higher.  I will advise her to increase pravastatin to 40 mg daily. TFTs are normal but TSI's have increased. We will go ahead with a thyroid uptake as discussed.  Carlus Pavlov, MD PhD [facility] Endocrinology

## 2020-03-23 LAB — THYROID STIMULATING IMMUNOGLOBULIN: TSI: 221 % baseline — ABNORMAL HIGH (ref <140)

## 2020-03-23 LAB — COMPLETE METABOLIC PANEL WITH GFR
AG Ratio: 1.2 (calc) (ref 1.0–2.5)
ALT: 9 U/L (ref 6–29)
AST: 14 U/L (ref 10–30)
Albumin: 3.9 g/dL (ref 3.6–5.1)
Alkaline phosphatase (APISO): 64 U/L (ref 31–125)
BUN: 14 mg/dL (ref 7–25)
CO2: 21 mmol/L (ref 20–32)
Calcium: 9.2 mg/dL (ref 8.6–10.2)
Chloride: 106 mmol/L (ref 98–110)
Creat: 0.98 mg/dL (ref 0.50–1.10)
GFR, Est African American: 84 mL/min/{1.73_m2} (ref >=60)
GFR, Est Non African American: 72 mL/min/{1.73_m2} (ref >=60)
Globulin: 3.2 g/dL (calc) (ref 1.9–3.7)
Glucose, Bld: 81 mg/dL (ref 65–99)
Potassium: 4.3 mmol/L (ref 3.5–5.3)
Sodium: 140 mmol/L (ref 135–146)
Total Bilirubin: 0.4 mg/dL (ref 0.2–1.2)
Total Protein: 7.1 g/dL (ref 6.1–8.1)

## 2020-04-14 ENCOUNTER — Other Ambulatory Visit: Payer: Self-pay | Admitting: Internal Medicine

## 2020-04-16 DIAGNOSIS — U071 COVID-19: Secondary | ICD-10-CM | POA: Diagnosis not present

## 2020-04-21 ENCOUNTER — Other Ambulatory Visit: Payer: Self-pay | Admitting: Internal Medicine

## 2020-04-27 ENCOUNTER — Encounter: Payer: Self-pay | Admitting: Internal Medicine

## 2020-04-29 ENCOUNTER — Encounter (HOSPITAL_COMMUNITY)
Admission: RE | Admit: 2020-04-29 | Discharge: 2020-04-29 | Disposition: A | Discharge status: Home / Self Care | Payer: BC Managed Care – PPO | Source: Ambulatory Visit | Attending: Internal Medicine | Admitting: Internal Medicine

## 2020-04-29 ENCOUNTER — Other Ambulatory Visit: Payer: Self-pay

## 2020-04-29 ENCOUNTER — Ambulatory Visit (HOSPITAL_COMMUNITY)
Admission: RE | Admit: 2020-04-29 | Discharge: 2020-04-29 | Disposition: A | Discharge status: Home / Self Care | Payer: BC Managed Care – PPO | Source: Ambulatory Visit | Attending: Internal Medicine | Admitting: Internal Medicine

## 2020-04-29 DIAGNOSIS — E0500 Thyrotoxicosis with diffuse goiter without thyrotoxic crisis or storm: Secondary | ICD-10-CM | POA: Diagnosis not present

## 2020-04-29 MED ORDER — SODIUM IODIDE I 131 CAPSULE: 146.0000 | Freq: Once | INTRAVENOUS | Status: DC | PRN

## 2020-04-30 ENCOUNTER — Encounter (HOSPITAL_COMMUNITY)
Admission: RE | Admit: 2020-04-30 | Discharge: 2020-04-30 | Disposition: A | Discharge status: Home / Self Care | Payer: BC Managed Care – PPO | Source: Ambulatory Visit | Attending: Internal Medicine | Admitting: Internal Medicine

## 2020-04-30 ENCOUNTER — Other Ambulatory Visit: Payer: Self-pay | Admitting: Internal Medicine

## 2020-04-30 DIAGNOSIS — E0500 Thyrotoxicosis with diffuse goiter without thyrotoxic crisis or storm: Secondary | ICD-10-CM | POA: Diagnosis not present

## 2020-04-30 MED ORDER — SODIUM IODIDE I-123 7.4 MBQ CAPS: 146.0000 | ORAL_CAPSULE | Freq: Once | ORAL | Status: DC

## 2020-05-05 ENCOUNTER — Other Ambulatory Visit: Payer: Self-pay | Admitting: Internal Medicine

## 2020-06-01 ENCOUNTER — Encounter: Payer: Self-pay | Admitting: Internal Medicine

## 2020-06-02 ENCOUNTER — Other Ambulatory Visit: Payer: Self-pay | Admitting: Internal Medicine

## 2020-06-03 ENCOUNTER — Other Ambulatory Visit: Payer: Self-pay

## 2020-06-03 ENCOUNTER — Ambulatory Visit (HOSPITAL_COMMUNITY)
Admission: RE | Admit: 2020-06-03 | Discharge: 2020-06-03 | Disposition: A | Discharge status: Home / Self Care | Payer: BC Managed Care – PPO | Source: Ambulatory Visit | Attending: Internal Medicine | Admitting: Internal Medicine

## 2020-06-03 DIAGNOSIS — E0500 Thyrotoxicosis with diffuse goiter without thyrotoxic crisis or storm: Secondary | ICD-10-CM | POA: Insufficient documentation

## 2020-06-03 DIAGNOSIS — E062 Chronic thyroiditis with transient thyrotoxicosis: Secondary | ICD-10-CM | POA: Diagnosis not present

## 2020-06-03 LAB — HCG, SERUM, QUALITATIVE: Preg, Serum: NEGATIVE

## 2020-06-03 IMAGING — NM NM RAI THERAPY FOR HYPERTHYROIDISM
1 series · 1 of 1 positions shown · non-contrast
Comparison: [DATE].

CLINICAL DATA: Hyperthyroidism

EXAM:
RADIOACTIVE IODINE THERAPY FOR HYPERTHYROIDISM
TECHNIQUE: Radioactive iodine prescribed by Dr. LOPES. The risks and benefits
of radioactive iodine therapy were discussed with the patient in
detail by Dr. LOPES. Alternative therapies were also mentioned.
Radiation safety was discussed with the patient, including how to
protect the general public from exposure. There were no barriers to
communication. Written consent was obtained. The patient then
received a capsule containing the radiopharmaceutical.
The patient will follow-up with the referring physician.
RADIOPHARMACEUTICALS:  16.9 mCi [WJ] sodium iodide orally

[Series 1: 0 min · 4.14mm/px · 1 of 1 slices shown]
[im 1/1]
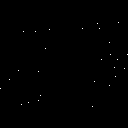

[1 of 1 positions shown; findings below may reference images not displayed]

IMPRESSION: Per oral administration of [WJ] sodium iodide for the treatment of
hyperthyroidism.

## 2020-06-03 MED ORDER — SODIUM IODIDE I 131 CAPSULE
16.9000 | Freq: Once | INTRAVENOUS | Status: AC | PRN
  Administered 2020-06-03: 16.9 via ORAL

## 2020-07-10 ENCOUNTER — Other Ambulatory Visit (INDEPENDENT_AMBULATORY_CARE_PROVIDER_SITE_OTHER): Payer: BC Managed Care – PPO

## 2020-07-10 ENCOUNTER — Encounter: Payer: Self-pay | Admitting: Internal Medicine

## 2020-07-10 ENCOUNTER — Other Ambulatory Visit: Payer: Self-pay | Admitting: Internal Medicine

## 2020-07-10 DIAGNOSIS — E0500 Thyrotoxicosis with diffuse goiter without thyrotoxic crisis or storm: Secondary | ICD-10-CM | POA: Diagnosis not present

## 2020-07-10 LAB — T4, FREE: Free T4: 1.42 ng/dL (ref 0.60–1.60)

## 2020-07-10 LAB — T3, FREE: T3, Free: 4.3 pg/mL — ABNORMAL HIGH (ref 2.3–4.2)

## 2020-07-10 LAB — TSH: TSH: 0.16 u[IU]/mL — ABNORMAL LOW (ref 0.35–4.50)

## 2020-07-22 DIAGNOSIS — Z1231 Encounter for screening mammogram for malignant neoplasm of breast: Secondary | ICD-10-CM | POA: Diagnosis not present

## 2020-08-18 ENCOUNTER — Other Ambulatory Visit: Payer: Self-pay | Admitting: Internal Medicine

## 2020-08-18 ENCOUNTER — Other Ambulatory Visit (INDEPENDENT_AMBULATORY_CARE_PROVIDER_SITE_OTHER): Payer: BC Managed Care – PPO

## 2020-08-18 DIAGNOSIS — E0500 Thyrotoxicosis with diffuse goiter without thyrotoxic crisis or storm: Secondary | ICD-10-CM | POA: Diagnosis not present

## 2020-08-18 LAB — TSH: TSH: 6.43 u[IU]/mL — ABNORMAL HIGH (ref 0.35–4.50)

## 2020-08-18 LAB — T4, FREE: Free T4: 0.37 ng/dL — ABNORMAL LOW (ref 0.60–1.60)

## 2020-08-18 LAB — T3, FREE: T3, Free: 2.5 pg/mL (ref 2.3–4.2)

## 2020-08-18 MED ORDER — LEVOTHYROXINE SODIUM 50 MCG PO TABS: 50.0000 ug | ORAL_TABLET | Freq: Every day | ORAL | 3 refills | Status: DC

## 2020-09-08 ENCOUNTER — Encounter: Payer: Self-pay | Admitting: Internal Medicine

## 2020-09-17 ENCOUNTER — Other Ambulatory Visit: Payer: Self-pay

## 2020-09-17 ENCOUNTER — Ambulatory Visit: Payer: BC Managed Care – PPO | Admitting: Internal Medicine

## 2020-09-17 ENCOUNTER — Encounter: Payer: Self-pay | Admitting: Internal Medicine

## 2020-09-17 VITALS — BP 120/82 | HR 79 | Ht 62.0 in | Wt 215.6 lb

## 2020-09-17 DIAGNOSIS — Z8639 Personal history of other endocrine, nutritional and metabolic disease: Secondary | ICD-10-CM | POA: Diagnosis not present

## 2020-09-17 DIAGNOSIS — E890 Postprocedural hypothyroidism: Secondary | ICD-10-CM

## 2020-09-17 DIAGNOSIS — E119 Type 2 diabetes mellitus without complications: Secondary | ICD-10-CM

## 2020-09-17 DIAGNOSIS — E1169 Type 2 diabetes mellitus with other specified complication: Secondary | ICD-10-CM | POA: Diagnosis not present

## 2020-09-17 DIAGNOSIS — E785 Hyperlipidemia, unspecified: Secondary | ICD-10-CM

## 2020-09-17 DIAGNOSIS — Z6836 Body mass index (BMI) 36.0-36.9, adult: Secondary | ICD-10-CM

## 2020-09-17 LAB — POCT GLYCOSYLATED HEMOGLOBIN (HGB A1C): Hemoglobin A1C: 6.1 % — AB (ref 4.0–5.6)

## 2020-09-17 MED ORDER — PRAVASTATIN SODIUM 20 MG PO TABS
ORAL_TABLET | ORAL | 3 refills | Status: DC
Start: 2020-09-17 — End: 2023-04-21

## 2020-09-17 NOTE — Progress Notes (Signed)
Patient ID: Ana ItoLashonda R [name], female   DOB: 05/13/1980, 40 y.o.   MRN: [id number]  This visit occurred during the SARS-CoV-2 public health emergency.  Safety protocols were in place, including screening questions prior to the visit, additional usage of staff PPE, and extensive cleaning of exam room while observing appropriate contact time as indicated for disinfecting solutions.   HPI: Ana [name] is a 40 y.o.-year-old female, returning for f/u DM2, dx summer 2014, non-insulin-dependent, controlled, without long-term complications and also Graves ds. Last visit 6 months ago.   Interim history: After last visit we increased her pravastatin dose.  She developed joint pains. She also mentions weight gain, but denies increased urination, blurry vision, nausea.  Reviewed HbA1c levels: Lab Results  Component Value Date   HGBA1C 5.7 (A) 03/19/2020   HGBA1C 5.5 09/10/2019   HGBA1C 5.6 04/24/2019  Previously 10.5% (10/2012).  Previously 6.5%.   Patient is on a regimen of: - Metformin ER 1000 >> 500 mg twice a day >> 1000 mg in am - Ozempic 0.5 mg weekly in a.m. >> had nausea, abd pain, HA initially, then resolved >> 1 mg weekly Stopped Bydureon due to reaction to the injection sites. We had to stop Invokana 100 mg b/c repeated yeast inf. (09/26/2013) She was on JanuMet when she was prediabetic >> tolerated it well.   She has a One Touch Ultra meter.  She is checking sugars 0-1x a day: - am:  90s >> 90s >> 90s >> 90s >> 91-96 - 2h after b'fast: 98-118 >> n/c - lunch:  90-120 >> n/c - 2h after lunch:  up to 130 >> <130 >> n/c - dinner  90s-130s >> n/c >> 121-130 - 2h after dinner: 148 >> up to 130 >> <130 >> n/c Highest: 150s >> 148 (Christmas) >> 130.  -No CKD, last BUN/creatinine:  Lab Results  Component Value Date   BUN 14 03/19/2020   CREATININE 0.98 03/19/2020   Normal ACR: Lab Results  Component Value Date   MICRALBCREAT 2.3 03/19/2020   MICRALBCREAT 2.0  10/26/2018   MICRALBCREAT 3.4 12/11/2017   MICRALBCREAT 10.3 08/03/2016   MICRALBCREAT 4.0 07/10/2015   MICRALBCREAT 5.7 02/05/2014   MICRALBCREAT 9.4 07/11/2013   MICRALBCREAT 36.3 (H) 03/07/2013   She had proteinuria after pregnancy 2010.   -+ HL. Last lipids: Lab Results  Component Value Date   CHOL 184 03/19/2020   HDL 41.30 03/19/2020   LDLCALC 117 (H) 03/19/2020   LDLDIRECT 97.0 06/05/2018   TRIG 126.0 03/19/2020   CHOLHDL 4 03/19/2020  On pravastatin 20.  - Last eye exam 04/2018: No DR  - no numbness and tingling in her feet.  H/o Graves ds.:  Reviewed her treatment history: 02/2014: started MMI 10 mg twice a day MMI tapered down 09/2015: stopped MMI 07/2016: restarted MMI 5 mg daily 06/2017: MMI decrease to 2.5 mg daily  11/2017: MMI stopped again 04/2019: MMI restarted at 5 mg twice a day 06/2019: MMI decrease to 5 mg daily 08/2019: MMI decrease to 2.5 mg daily -did not come back for labs...  Thyroid uptake (04/29/2020): Increased, at 44.5% RAI treatment (06/03/2020)  Now with postablative hypothyroidism, started on levothyroxine 50 mcg daily in 08/2020.  She takes this: - in am - fasting - at least 30 min from b'fast - no calcium - no iron - no multivitamins - no PPIs - not on Biotin  Reviewed her TFTs: Lab Results  Component Value Date   TSH 6.43 (H) 08/18/2020  TSH 0.16 (L) 07/10/2020   TSH 1.69 03/19/2020   TSH 4.83 (H) 09/09/2019   TSH 0.02 (L) 07/15/2019   FREET4 0.37 (L) 08/18/2020   FREET4 1.42 07/10/2020   FREET4 0.67 03/19/2020   FREET4 0.61 09/09/2019   FREET4 0.59 (L) 07/15/2019   Lab Results  Component Value Date   T3FREE 2.5 08/18/2020   T3FREE 4.3 (H) 07/10/2020   T3FREE 3.2 03/19/2020   T3FREE 3.0 09/09/2019   T3FREE 3.0 07/15/2019   T3FREE 6.0 (H) 04/24/2019   T3FREE 3.2 10/26/2018   T3FREE 3.2 06/05/2018   T3FREE 3.5 01/30/2018   T3FREE 4.1 12/11/2017   At last check, her Graves' antibodies were again  detectable: Lab Results  Component Value Date   TSI 221 (H) 03/19/2020   TSI <89 01/30/2018   TSI 210 (H) 04/06/2017   TSI 152 (H) 02/11/2016   TSI 188 (H) 12/18/2013   She also has a history of PCOS, HTN, vitamin D deficiency.  ROS: Constitutional: + weight gain/no weight loss, no fatigue, no subjective hyperthermia, no subjective hypothermia Eyes: no blurry vision, no xerophthalmia ENT: no sore throat, no nodules palpated in neck, no dysphagia, no odynophagia, no hoarseness Cardiovascular: no CP/no SOB/no palpitations/no leg swelling Respiratory: no cough/no SOB/no wheezing Gastrointestinal: no N/no V/no D/no C/no acid reflux Musculoskeletal: no muscle aches/+ joint aches Skin: no rashes, no hair loss Neurological: no tremors/no numbness/no tingling/no dizziness  I reviewed pt's medications, allergies, PMH, social hx, family hx, and changes were documented in the history of present illness. Otherwise, unchanged from my initial visit note.  Past Medical History:  Diagnosis Date  . DM2 (diabetes mellitus, type 2) (HCC) 01/2013 dx  . Hypertension   . Other and unspecified hyperlipidemia   . PCOS (polycystic ovarian syndrome)    Past Surgical History:  Procedure Laterality Date  . CESAREAN SECTION  2010   Social History   Socioeconomic History  . Marital status: Married    Spouse name: Not on file  . Number of children: Not on file  . Years of education: Not on file  . Highest education level: Not on file  Occupational History  . Not on file  Tobacco Use  . Smoking status: Never Smoker  . Smokeless tobacco: Never Used  Substance and Sexual Activity  . Alcohol use: No  . Drug use: No  . Sexual activity: Yes    Partners: Male  Other Topics Concern  . 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 levothyroxine (SYNTHROID) 50 MCG tablet Take 1 tablet (50 mcg total) by mouth daily. 45 tablet 3  . amLODipine (NORVASC) 5 MG tablet TAKE 1 TABLET BY MOUTH EVERY DAY NEEDS APPT FOR REFILLS 30 tablet 0  . glucose blood test strip Use 2 times day as instructed - One Touch Ultra 100 each 11  . ibuprofen (ADVIL) 800 MG tablet TAKE 1 TABLET BY MOUTH EVERY 8 HOURS AS NEEDED 90 tablet 1  .  levonorgestrel (MIRENA) 20 MCG/24HR IUD 1 Intra Uterine Device (1 each total) by Intrauterine route once. Placed 07/19/13 - gyn 1 each 0  . metFORMIN (GLUCOPHAGE-XR) 500 MG 24 hr tablet TAKE 2 TABS TWICE A DAY BY MOUTH 180 tablet 1  . metoprolol succinate (TOPROL-XL) 25 MG 24 hr tablet TAKE 2 TABLETS BY MOUTH EVERY DAY 180 tablet 3  . OZEMPIC, 0.25 OR 0.5 MG/DOSE, 2 MG/1.5ML SOPN INJECT 0.5 MG INTO THE SKIN ONCE A WEEK. 1.5 mL 3  . pravastatin (PRAVACHOL) 20 MG tablet Take 1 tablet daily 90 tablet 3  . Semaglutide, 1 MG/DOSE, (OZEMPIC, 1 MG/DOSE,) 4 MG/3ML SOPN Inject 1 mg into the skin once a week. 9 mL 3  . topiramate (TOPAMAX) 25 MG tablet Take 2 tablets (50 mg total) by mouth daily. 180 tablet 3   No current facility-administered medications on file prior to visit.   Allergies  Allergen Reactions  . Invokana [Canagliflozin]     Recurrent yeast infections   Family History  Problem Relation Age of Onset  . Diabetes Mother        Type II  . Thyroid disease Mother   . Hypertension Mother   . Hyperlipidemia Mother   . Diabetes Father        TYPE II  . Hypertension Father   . Hyperlipidemia Father   . Heart disease Father        CAD with stent  . Hyperlipidemia Maternal Grandmother   . Hypertension Maternal  Grandmother   . Obesity Maternal Grandmother   . Diabetes Maternal Grandmother   . Cancer Paternal Grandmother 73       colon cancer  . Diabetes Paternal Grandmother    PE: BP 120/82 (BP Location: Right Arm, Patient Position: Sitting, Cuff Size: Normal)   Pulse 79   Ht 5\' 2"  (1.575 m)   Wt 215 lb 9.6 oz (97.8 kg)   SpO2 99%   BMI 39.43 kg/m  Body mass index is 39.43 kg/m.  Wt Readings from Last 3 Encounters:  09/17/20 215 lb 9.6 oz (97.8 kg)  03/19/20 210 lb 12.8 oz (95.6 kg)  09/10/19 198 lb 4 oz (89.9 kg)   Constitutional: overweight, in NAD Eyes: PERRLA, EOMI, no exophthalmos ENT: moist mucous membranes, no thyromegaly, no cervical lymphadenopathy Cardiovascular: tachycardia, RR, No MRG Respiratory: CTA B Gastrointestinal: abdomen soft, NT, ND, BS+ Musculoskeletal: no deformities, strength intact in all 4 Skin: moist, warm, no rashes Neurological: no tremor with outstretched hands, DTR normal in all 4  ASSESSMENT: 1. DM2, non-insulin-dependent, uncontrolled, without complications  2. Graves ds - 03/12/2014: Thyroid uptake and scan: Consistent with Graves' disease: The 24 hr uptake by the thyroid gland is 65%. Normal 24 hr uptake is 10-30%. On thyroid imaging, the thyroid activity appears diffusely increased. No focal hot or cold nodules are identified.  3.  Hyperlipidemia  4. Obesity class II  PLAN:  1. Patient with well-controlled type 2 diabetes, on metformin and weekly GLP-1 receptor agonist.  We are using Ozempic for controlling weight and only marginally for her diabetes control. -At last visit, sugars were at goal, between 90 and 130, but she gained more than 10 pounds so we increased her Ozempic dose.  She is tolerating the higher dose well.  HbA1c was slightly elevated, but still excellent, at 5.7%. -At today's visit, sugars at home remain at goal, but she is not checking frequently and usually only in the morning.  When she is checking before dinner, these  are  at the upper limit of the normal interval. HbA1c: 6.1% (higher) -For now, we will continue the current regimen but I did advise her to rotate her blood sugar checks more -I advised her to: Patient Instructions  Please continue: - Metformin ER 1000 mg with b'fast - Ozempic 1 mg weekly in a.m.  Check sugars at different times of the day.  Please continue levothyroxine 50 mcg daily.  Take the thyroid hormone every day, with water, at least 30 minutes before breakfast, separated by at least 4 hours from: - acid reflux medications - calcium - iron - multivitamins  Move Multivitamins to lunchtime or later.  Please come back for labs in 5-6 weeks.  Please restart Pravastatin 20 mg daily.  Decrease Metoprolol to once a day. If pulse <90 at rest, you can stop Metoprolol completely after 2-3 weeks.  Please return in 4 months with your sugar log.   - advised to check sugars at different times of the day - 1x a day, rotating check times - advised for yearly eye exams >> she is not UTD - return to clinic in 6 months  2.  Post ablative hypothyroidism -Patient with recurrent Graves' disease, with minimal response to methimazole.  Since last visit, she had a thyroid uptake (04/29/2020), which was elevated at 44.5%, and then RAI treatment on 06/03/2020.   -She developed post ablative hypothyroidism.  We started levothyroxine low-dose after her lab results from 08/18/2020. - latest thyroid labs reviewed with pt. >> TSH was elevated: Lab Results  Component Value Date   TSH 6.43 (H) 08/18/2020  - she continues on LT4 50 mcg daily - pt feels good on this dose. -At this visit, we discussed about starting to decrease and then stop metoprolol.  Pulse today is normal, at 79 - we discussed about taking the thyroid hormone every day, with water, >30 minutes before breakfast, separated by >4 hours from acid reflux medications, calcium, iron, multivitamins. Pt. Is not taking it correctly: takes MVI  only 1h later, so I advised her to move this at least 4 hours later - will check thyroid tests in 5 to 6 weeks, after starting to take multivitamins correctly: TSH and fT4  3.  Hyperlipidemia -Reviewed latest lipid panel, LDL higher, above goal, the rest of the fractions at goal: Lab Results  Component Value Date   CHOL 184 03/19/2020   HDL 41.30 03/19/2020   LDLCALC 117 (H) 03/19/2020   LDLDIRECT 97.0 06/05/2018   TRIG 126.0 03/19/2020   CHOLHDL 4 03/19/2020  -After the above results returned, I advised her to increase pravastatin from 20 to 40 mg daily.  She does have joint pains, varies from pravastatin.  I did advise her to hold the medication for 2 weeks to see if the pain improves. She is now off it for 1 week. -will restart Pravastatin 20 mg daily as she tolerated this well.  4.  Obesity class II -Previously on Belviq (but no significant weight loss with this), however, this is now taken off the market -She was doing well on Qsymia but this was not approved by her insurance -She developed injection site reaction to Bydureon in the past -She initially lost a significant amount of weight (approximately 18 pounds since starting Ozempic) but then gained them back -At last visit, we increased Ozempic mostly to help with weight loss-she tolerates this well -She gained 5 pounds since last visit  Carlus Pavlov, MD PhD [facility] End[facility]

## 2020-09-17 NOTE — Patient Instructions (Addendum)
Please continue: - Metformin ER 1000 mg with b'fast - Ozempic 1 mg weekly in a.m.  Check sugars at different times of the day.  Please continue levothyroxine 50 mcg daily.  Take the thyroid hormone every day, with water, at least 30 minutes before breakfast, separated by at least 4 hours from: - acid reflux medications - calcium - iron - multivitamins  Move Multivitamins to lunchtime or later.  Please come back for labs in 5-6 weeks.  Please restart Pravastatin 20 mg daily.  Decrease Metoprolol to once a day. If pulse <90 at rest, you can stop Metoprolol completely after 2-3 weeks.  Please return in 4 months with your sugar log.

## 2020-11-12 ENCOUNTER — Other Ambulatory Visit (INDEPENDENT_AMBULATORY_CARE_PROVIDER_SITE_OTHER): Payer: BC Managed Care – PPO

## 2020-11-12 DIAGNOSIS — E890 Postprocedural hypothyroidism: Secondary | ICD-10-CM | POA: Diagnosis not present

## 2020-11-12 LAB — TSH: TSH: 43.95 u[IU]/mL — ABNORMAL HIGH (ref 0.35–5.50)

## 2020-11-12 LAB — T4, FREE: Free T4: 0.4 ng/dL — ABNORMAL LOW (ref 0.60–1.60)

## 2020-11-16 ENCOUNTER — Encounter: Payer: Self-pay | Admitting: Internal Medicine

## 2020-11-16 ENCOUNTER — Other Ambulatory Visit: Payer: Self-pay | Admitting: Internal Medicine

## 2020-11-16 ENCOUNTER — Telehealth: Payer: Self-pay | Admitting: Internal Medicine

## 2020-11-16 MED ORDER — LEVOTHYROXINE SODIUM 100 MCG PO TABS: 100.0000 ug | ORAL_TABLET | Freq: Every day | ORAL | 3 refills | Status: DC

## 2020-11-16 NOTE — Telephone Encounter (Signed)
Please let the pt know that her thyroid continues to be underactive.    Please ask her to take TWO tables of levothyroxine 50 mcg daily from now on   When the current bottle is finishes, she needs to start taking ONE tablet daily and it will be the 100 mcg dose.      Ana Raelyn Mora, MD  [facility] Endocrin[facility]  [facility] [address] [place], [place] [address] Phone: [phone] FAX: [phone]

## 2020-11-16 NOTE — Telephone Encounter (Signed)
Called and spoke with patient regarding results, advised pt of dose change of to , pt said that she just finished the 50 mcg and need a new order for 100 mcg sent to [facility] on [address]. Pt understood results and changes to medications.

## 2020-11-17 NOTE — Telephone Encounter (Signed)
Pt contacted via phone call regarding medication changes.

## 2021-01-27 ENCOUNTER — Other Ambulatory Visit: Payer: Self-pay

## 2021-01-27 ENCOUNTER — Encounter: Payer: Self-pay | Admitting: Internal Medicine

## 2021-01-27 ENCOUNTER — Ambulatory Visit: Payer: BC Managed Care – PPO | Admitting: Internal Medicine

## 2021-01-27 VITALS — BP 130/88 | HR 98 | Ht 62.0 in | Wt 221.4 lb

## 2021-01-27 DIAGNOSIS — E1169 Type 2 diabetes mellitus with other specified complication: Secondary | ICD-10-CM | POA: Diagnosis not present

## 2021-01-27 DIAGNOSIS — Z8639 Personal history of other endocrine, nutritional and metabolic disease: Secondary | ICD-10-CM

## 2021-01-27 DIAGNOSIS — E785 Hyperlipidemia, unspecified: Secondary | ICD-10-CM | POA: Diagnosis not present

## 2021-01-27 DIAGNOSIS — E119 Type 2 diabetes mellitus without complications: Secondary | ICD-10-CM | POA: Diagnosis not present

## 2021-01-27 DIAGNOSIS — E890 Postprocedural hypothyroidism: Secondary | ICD-10-CM

## 2021-01-27 LAB — POCT GLYCOSYLATED HEMOGLOBIN (HGB A1C): Hemoglobin A1C: 6.4 % — AB (ref 4.0–5.6)

## 2021-01-27 LAB — T4, FREE: Free T4: 0.65 ng/dL (ref 0.60–1.60)

## 2021-01-27 LAB — TSH: TSH: 13.71 u[IU]/mL — ABNORMAL HIGH (ref 0.35–5.50)

## 2021-01-27 MED ORDER — TIRZEPATIDE 7.5 MG/0.5ML ~~LOC~~ SOAJ: 7.5000 mg | SUBCUTANEOUS | 5 refills | Status: DC

## 2021-01-27 MED ORDER — LEVOTHYROXINE SODIUM 125 MCG PO TABS: 125.0000 ug | ORAL_TABLET | Freq: Every day | ORAL | 3 refills | Status: DC

## 2021-01-27 MED ORDER — METFORMIN HCL ER 500 MG PO TB24: ORAL_TABLET | ORAL | 3 refills | Status: DC

## 2021-01-27 NOTE — Progress Notes (Signed)
Patient ID: Ana [name], female   DOB: 14-May-1980, 40 y.o.   MRN: [id number]  This visit occurred during the SARS-CoV-2 public health emergency.  Safety protocols were in place, including screening questions prior to the visit, additional usage of staff PPE, and extensive cleaning of exam room while observing appropriate contact time as indicated for disinfecting solutions.   HPI: Ana [name] is a 40 y.o.-year-old female, returning for f/u DM2, dx summer 2014, non-insulin-dependent, controlled, without long-term complications and also Graves ds. Last visit 4 months ago.   Interim history: A little more increased urination, but no blurry vision, nausea, chest pain. She still has mm aches and weight gain.  Reviewed HbA1c levels: Lab Results  Component Value Date   HGBA1C 6.1 (A) 09/17/2020   HGBA1C 5.7 (A) 03/19/2020   HGBA1C 5.5 09/10/2019  Previously 10.5% (10/2012).  Previously 6.5%.   Patient is on a regimen of: - Metformin ER 1000 >> 500 mg twice a day >> 1000 mg in am - Ozempic 0.5 mg weekly in a.m. >> had nausea, abd pain, HA initially, then resolved >> 1 mg weekly Stopped Bydureon due to reaction to the injection sites. We had to stop Invokana 100 mg b/c repeated yeast inf. (09/26/2013) She was on JanuMet when she was prediabetic >> tolerated it well.   She has a One Touch Ultra meter.  She is not checking sugars now... From before: - am:  90s >> 90s >> 90s >> 90s >> 91-96 - 2h after b'fast: 98-118 >> n/c - lunch:  90-120 >> n/c - 2h after lunch:  up to 130 >> <130 >> n/c - dinner  90s-130s >> n/c >> 121-130 - 2h after dinner: 148 >> up to 130 >> <130 >> n/c Highest: 150s >> 148 (Christmas) >> 130 >> ?  -No CKD, last BUN/creatinine:  Lab Results  Component Value Date   BUN 14 03/19/2020   CREATININE 0.98 03/19/2020   Normal ACR: Lab Results  Component Value Date   MICRALBCREAT 2.3 03/19/2020   MICRALBCREAT 2.0 10/26/2018   MICRALBCREAT 3.4  12/11/2017   MICRALBCREAT 10.3 08/03/2016   MICRALBCREAT 4.0 07/10/2015   MICRALBCREAT 5.7 02/05/2014   MICRALBCREAT 9.4 07/11/2013   MICRALBCREAT 36.3 (H) 03/07/2013   She had proteinuria after pregnancy 2010.   -+ HL. Last lipids: Lab Results  Component Value Date   CHOL 184 03/19/2020   HDL 41.30 03/19/2020   LDLCALC 117 (H) 03/19/2020   LDLDIRECT 97.0 06/05/2018   TRIG 126.0 03/19/2020   CHOLHDL 4 03/19/2020  On pravastatin 20.  - Last eye exam 04/2018: No DR  - no numbness and tingling in her feet.  H/o Graves ds.:  Reviewed her treatment history: 02/2014: started MMI 10 mg twice a day MMI tapered down 09/2015: stopped MMI 07/2016: restarted MMI 5 mg daily 06/2017: MMI decrease to 2.5 mg daily  11/2017: MMI stopped again 04/2019: MMI restarted at 5 mg twice a day 06/2019: MMI decrease to 5 mg daily 08/2019: MMI decrease to 2.5 mg daily -did not come back for labs...  Thyroid uptake (04/29/2020): Increased, at 44.5% RAI treatment (06/03/2020) Levothyroxine started 08/2020. Metoprolol decreased 09/2020 but she tells me that she did not stop it completely yet.  She is on levothyroxine 100 mcg daily (dose increase 11/2020) - in am - fasting - at least 1h from b'fast - no calcium - no iron - + multivitamins and vitamin D at night  - no PPIs - not on Biotin -  on B12 at night  Reviewed her TFTs: Lab Results  Component Value Date   TSH 43.95 (H) 11/12/2020   TSH 6.43 (H) 08/18/2020   TSH 0.16 (L) 07/10/2020   TSH 1.69 03/19/2020   TSH 4.83 (H) 09/09/2019   FREET4 0.40 (L) 11/12/2020   FREET4 0.37 (L) 08/18/2020   FREET4 1.42 07/10/2020   FREET4 0.67 03/19/2020   FREET4 0.61 09/09/2019   Lab Results  Component Value Date   T3FREE 2.5 08/18/2020   T3FREE 4.3 (H) 07/10/2020   T3FREE 3.2 03/19/2020   T3FREE 3.0 09/09/2019   T3FREE 3.0 07/15/2019   T3FREE 6.0 (H) 04/24/2019   T3FREE 3.2 10/26/2018   T3FREE 3.2 06/05/2018   T3FREE 3.5 01/30/2018    T3FREE 4.1 12/11/2017   At last check, her Graves' antibodies were again detectable: Lab Results  Component Value Date   TSI 221 (H) 03/19/2020   TSI <89 01/30/2018   TSI 210 (H) 04/06/2017   TSI 152 (H) 02/11/2016   TSI 188 (H) 12/18/2013   She also has a history of PCOS, HTN, vitamin D deficiency.  ROS: + See HPI  I reviewed pt's medications, allergies, PMH, social hx, family hx, and changes were documented in the history of present illness. Otherwise, unchanged from my initial visit note.  Past Medical History:  Diagnosis Date   DM2 (diabetes mellitus, type 2) (HCC) 01/2013 dx   Hypertension    Other and unspecified hyperlipidemia    PCOS (polycystic ovarian syndrome)    Past Surgical History:  Procedure Laterality Date   CESAREAN SECTION  2010   Social History   Socioeconomic History   Marital status: Married    Spouse name: Not on file   Number of children: Not on file   Years of education: Not on file   Highest education level: Not on file  Occupational History   Not on file  Tobacco Use   Smoking status: Never   Smokeless tobacco: Never  Substance and Sexual Activity   Alcohol use: No   Drug use: No   Sexual activity: Yes    Partners: Male  Other Topics Concern   Not on file  Social History Narrative   Regular exercise: no   Caffeine use: 2 soda's daily   Lives with spouse and son   Works at [employer] as Psychologist, occupational   Social Determinants of Corporate investment banker Strain: Not on Ship broker Insecurity: Not on file  Transportation Needs: Not on file  Physical Activity: Not on file  Stress: Not on file  Social Connections: Not on file  Intimate Partner Violence: Not on file   Current Outpatient Medications on File Prior to Visit  Medication Sig Dispense Refill   amLODipine (NORVASC) 5 MG tablet TAKE 1 TABLET BY MOUTH EVERY DAY NEEDS APPT FOR REFILLS 30 tablet 0   glucose blood test strip Use 2 times day as instructed - One Touch Ultra 100 each 11    ibuprofen (ADVIL) 800 MG tablet TAKE 1 TABLET BY MOUTH EVERY 8 HOURS AS NEEDED 90 tablet 1   levonorgestrel (MIRENA) 20 MCG/24HR IUD 1 Intra Uterine Device (1 each total) by Intrauterine route once. Placed 07/19/13 - gyn 1 each 0   levothyroxine (SYNTHROID) 100 MCG tablet Take 1 tablet (100 mcg total) by mouth daily. 90 tablet 3   metFORMIN (GLUCOPHAGE-XR) 500 MG 24 hr tablet TAKE 2 TABS TWICE A DAY BY MOUTH 180 tablet 1   metoprolol succinate (TOPROL-XL) 25 MG  24 hr tablet TAKE 2 TABLETS BY MOUTH EVERY DAY 180 tablet 3   pravastatin (PRAVACHOL) 20 MG tablet Take 1 tablet daily 90 tablet 3   Semaglutide, 1 MG/DOSE, (OZEMPIC, 1 MG/DOSE,) 4 MG/3ML SOPN Inject 1 mg into the skin once a week. 9 mL 3   topiramate (TOPAMAX) 25 MG tablet Take 2 tablets (50 mg total) by mouth daily. 180 tablet 3   No current facility-administered medications on file prior to visit.   Allergies  Allergen Reactions   Invokana [Canagliflozin]     Recurrent yeast infections   Family History  Problem Relation Age of Onset   Diabetes Mother        Type II   Thyroid disease Mother    Hypertension Mother    Hyperlipidemia Mother    Diabetes Father        TYPE II   Hypertension Father    Hyperlipidemia Father    Heart disease Father        CAD with stent   Hyperlipidemia Maternal Grandmother    Hypertension Maternal Grandmother    Obesity Maternal Grandmother    Diabetes Maternal Grandmother    Cancer Paternal Grandmother 8       colon cancer   Diabetes Paternal Grandmother    PE: BP 130/88 (BP Location: Left Arm, Patient Position: Sitting, Cuff Size: Normal)   Pulse 98   Ht 5\' 2"  (1.575 m)   Wt 221 lb 6.4 oz (100.4 kg)   SpO2 99%   BMI 40.49 kg/m  Body mass index is 40.49 kg/m.  Wt Readings from Last 3 Encounters:  01/27/21 221 lb 6.4 oz (100.4 kg)  09/17/20 215 lb 9.6 oz (97.8 kg)  03/19/20 210 lb 12.8 oz (95.6 kg)   Constitutional: overweight, in NAD Eyes: PERRLA, EOMI, no exophthalmos ENT:  moist mucous membranes, no thyromegaly, no cervical lymphadenopathy Cardiovascular: RRR, No MRG Respiratory: CTA B Gastrointestinal: abdomen soft, NT, ND, BS+ Musculoskeletal: no deformities, strength intact in all 4 Skin: moist, warm, no rashes Neurological: no tremor with outstretched hands, DTR normal in all 4  ASSESSMENT: 1. DM2, non-insulin-dependent, uncontrolled, without complications  2. Graves ds - 03/12/2014: Thyroid uptake and scan: Consistent with Graves' disease: The 24 hr uptake by the thyroid gland is 65%. Normal 24 hr uptake is 10-30%. On thyroid imaging, the thyroid activity appears diffusely increased. No focal hot or cold nodules are identified.  3.  Hyperlipidemia  4. Obesity class II  PLAN:  1. Patient with well-controlled type 2 diabetes, on metformin and weekly GLP-1 receptor agonist.  We are using Ozempic mostly for weight control, but also for blood sugar improvement.  At last visit HbA1c was slightly higher, at 6.1%.  Sugars were at goal but she was only checking in the morning but not very frequently.  We discussed about checking daily and rotate the blood sugar checks but otherwise we did not change her regimen. -At today's visit, she is not checking sugars.  I strongly advised him to start.  HbA1c appears to be higher than before (see below), but still within acceptable range.  At this visit, mostly to improve her weight but also blood sugars, I suggested to switch from Ozempic to [facility], which has a stronger effect on the weight.  Given coupon card.  Discussed that this has the same possible side effects as Ozempic.  We are starting at a lower dose and will increase as tolerated. -I advised her to: Patient Instructions  Please continue: - Metformin  ER 1000 mg with b'fast  Switch from Ozempic to: - Mounjaro 7.5 mg weekly  Try to stop Metoprolol.  Please continue levothyroxine 100 mcg daily.  Take the thyroid hormone every day, with water, at least 30  minutes before breakfast, separated by at least 4 hours from: - acid reflux medications - calcium - iron - multivitamins  Please stop at the lab.  Please return in 4 months with your sugar log.   - we checked her HbA1c: 6.4% (higher) - advised to start check sugars at different times of the day - 1x a day, rotating check times - advised for yearly eye exams >> she is not UTD -again advised to schedule - return to clinic in 4 months  2.  Post ablative hypothyroidism -Patient with history of recurrent Graves' disease, with minimal response to methimazole.  A thyroid uptake was elevated, at 44.5% in 04/2020.  She had RAI treatment on 06/03/2020.   -She then developed post ablative hypothyroidism.  We started levothyroxine low-dose after her lab results from 08/18/2020. - latest thyroid labs reviewed with pt. >> very high: Lab Results  Component Value Date   TSH 43.95 (H) 11/12/2020  - she continues on LT4 100 mcg daily, increased from 50 mcg daily after the above results returned - pt feels good on this dose but she is very frustrated about continuing increasing weight -Upon questioning, she is still taking metoprolol.  For now, we will try to stop.  Pulse is high at today's visit but I advised her to check at home and let me know if it remains elevated after stopping metoprolol. - we discussed about taking the thyroid hormone every day, with water, >30 minutes before breakfast, separated by >4 hours from acid reflux medications, calcium, iron, multivitamins. Pt. is taking it correctly.  At last visit, we moved her multivitamins later in the day, and she was taking them only 1 hour after levothyroxine. - will check thyroid tests today: TSH and fT4 - If labs are abnormal, she will need to return for repeat TFTs in 1.5 months  3.  Hyperlipidemia -Reviewed latest lipid panel, LDL higher, above goal, the rest of the fractions at goal: Lab Results  Component Value Date   CHOL 184 03/19/2020    HDL 41.30 03/19/2020   LDLCALC 117 (H) 03/19/2020   LDLDIRECT 97.0 06/05/2018   TRIG 126.0 03/19/2020   CHOLHDL 4 03/19/2020  -After the above results returned, I advised her to increase pravastatin from 20 to 40 mg daily.  However, she developed joint pains with this dose and we held the medication. -At last visit, we restarted atorvastatin 20 mg daily.  She tolerates this well.  4.  Obesity class II -Previously on Belviq (but no significant weight loss with this), however, this is now taken off the market -She was doing well on Qsymia but this was not approved by her insurance -She developed injection site reaction to Bydureon in the past -She initially lost a significant amount of weight (approximately 18 pounds since starting Ozempic) but then gained them back -Currently on Ozempic, which she tolerates well but will switch to [facility], which is a stronger effect on weight -She gained 5 pounds before last visit  Component     Latest Ref Rng & Units 01/27/2021  TSH     0.35 - 5.50 uIU/mL 13.71 (H)  T4,Free(Direct)     0.60 - 1.60 ng/dL 8.31  TSH has improved but it is still high.  I will  advise him to increase the dose of levothyroxine to 125 mcg daily and come back for labs in 5 to 6 weeks.  Carlus Pavlov, MD PhD [facility]

## 2021-01-27 NOTE — Patient Instructions (Addendum)
Please continue: - Metformin ER 1000 mg with b'fast  Switch from Ozempic to: - Mounjaro 7.5 mg weekly  Try to stop Metoprolol.  Please continue levothyroxine 100 mcg daily.  Take the thyroid hormone every day, with water, at least 30 minutes before breakfast, separated by at least 4 hours from: - acid reflux medications - calcium - iron - multivitamins  Please stop at the lab.  Please return in 4 months with your sugar log.

## 2021-02-01 ENCOUNTER — Other Ambulatory Visit: Payer: Self-pay | Admitting: Internal Medicine

## 2021-02-03 ENCOUNTER — Other Ambulatory Visit: Payer: Self-pay | Admitting: Internal Medicine

## 2021-02-03 DIAGNOSIS — Z23 Encounter for immunization: Secondary | ICD-10-CM | POA: Diagnosis not present

## 2021-03-12 ENCOUNTER — Other Ambulatory Visit: Payer: Self-pay | Admitting: Internal Medicine

## 2021-03-12 DIAGNOSIS — E119 Type 2 diabetes mellitus without complications: Secondary | ICD-10-CM

## 2021-03-15 ENCOUNTER — Encounter: Payer: Self-pay | Admitting: Internal Medicine

## 2021-03-15 DIAGNOSIS — E119 Type 2 diabetes mellitus without complications: Secondary | ICD-10-CM

## 2021-03-16 MED ORDER — METFORMIN HCL ER 500 MG PO TB24: ORAL_TABLET | ORAL | 1 refills | Status: DC

## 2021-03-29 ENCOUNTER — Encounter: Payer: Self-pay | Admitting: Internal Medicine

## 2021-03-31 ENCOUNTER — Other Ambulatory Visit: Payer: Self-pay | Admitting: Internal Medicine

## 2021-04-08 ENCOUNTER — Other Ambulatory Visit: Payer: Self-pay | Admitting: Internal Medicine

## 2021-04-26 DIAGNOSIS — E113299 Type 2 diabetes mellitus with mild nonproliferative diabetic retinopathy without macular edema, unspecified eye: Secondary | ICD-10-CM | POA: Diagnosis not present

## 2021-04-27 DIAGNOSIS — Z01419 Encounter for gynecological examination (general) (routine) without abnormal findings: Secondary | ICD-10-CM | POA: Diagnosis not present

## 2021-04-27 DIAGNOSIS — Z6839 Body mass index (BMI) 39.0-39.9, adult: Secondary | ICD-10-CM | POA: Diagnosis not present

## 2021-04-29 ENCOUNTER — Other Ambulatory Visit: Payer: Self-pay | Admitting: Obstetrics and Gynecology

## 2021-04-29 DIAGNOSIS — Z8249 Family history of ischemic heart disease and other diseases of the circulatory system: Secondary | ICD-10-CM

## 2021-05-20 ENCOUNTER — Other Ambulatory Visit: Payer: BC Managed Care – PPO

## 2021-05-27 ENCOUNTER — Other Ambulatory Visit: Payer: Self-pay

## 2021-05-27 ENCOUNTER — Ambulatory Visit: Payer: BC Managed Care – PPO | Admitting: Internal Medicine

## 2021-05-27 ENCOUNTER — Encounter: Payer: Self-pay | Admitting: Internal Medicine

## 2021-05-27 VITALS — BP 120/82 | HR 109 | Ht 62.0 in | Wt 209.0 lb

## 2021-05-27 DIAGNOSIS — E559 Vitamin D deficiency, unspecified: Secondary | ICD-10-CM

## 2021-05-27 DIAGNOSIS — E66812 Obesity, class 2: Secondary | ICD-10-CM

## 2021-05-27 DIAGNOSIS — E1169 Type 2 diabetes mellitus with other specified complication: Secondary | ICD-10-CM | POA: Diagnosis not present

## 2021-05-27 DIAGNOSIS — E669 Obesity, unspecified: Secondary | ICD-10-CM | POA: Diagnosis not present

## 2021-05-27 DIAGNOSIS — E119 Type 2 diabetes mellitus without complications: Secondary | ICD-10-CM

## 2021-05-27 DIAGNOSIS — E785 Hyperlipidemia, unspecified: Secondary | ICD-10-CM

## 2021-05-27 DIAGNOSIS — E890 Postprocedural hypothyroidism: Secondary | ICD-10-CM | POA: Diagnosis not present

## 2021-05-27 LAB — VITAMIN D 25 HYDROXY (VIT D DEFICIENCY, FRACTURES): VITD: 27.42 ng/mL — ABNORMAL LOW (ref 30.00–100.00)

## 2021-05-27 LAB — TSH: TSH: 1.85 u[IU]/mL (ref 0.35–5.50)

## 2021-05-27 LAB — T4, FREE: Free T4: 1 ng/dL (ref 0.60–1.60)

## 2021-05-27 MED ORDER — LEVOTHYROXINE SODIUM 100 MCG PO TABS
100.0000 ug | ORAL_TABLET | Freq: Every day | ORAL | 3 refills | Status: DC
Start: 2021-05-27 — End: 2023-01-27

## 2021-05-27 NOTE — Progress Notes (Signed)
Defect over the radial given, but over so far a before but Patient ID: Ana [name], female   DOB: [birth year], 41 y.o.   MRN: [id number]  This visit occurred during the SARS-CoV-2 public health emergency.  Safety protocols were in place, including screening questions prior to the visit, additional usage of staff PPE, and extensive cleaning of exam room while observing appropriate contact time as indicated for disinfecting solutions.   HPI: Ana [name] is a 41 y.o.-year-old female, returning for f/u DM2, dx summer 2014, non-insulin-dependent, controlled, without long-term complications and also Graves ds. Last visit 4 months ago.   Interim history: No blurry vision, nausea, chest pain. At last visit she had muscle aches and weight gain.  These resolved.  She lost 12 pounds since last visit.  Reviewed HbA1c levels: Lab Results  Component Value Date   HGBA1C 6.4 (A) 01/27/2021   HGBA1C 6.1 (A) 09/17/2020   HGBA1C 5.7 (A) 03/19/2020  Previously 10.5% (10/2012).  Previously 6.5%.   Patient is on a regimen of: - Metformin ER 1000 >> 500 mg twice a day >> 1000 mg in am - Mounjaro 7.5 mg weekly  Stopped Bydureon due to reaction to the injection sites. We had to stop Invokana 100 mg b/c repeated yeast inf. (09/26/2013) She was on JanuMet when she was prediabetic >> tolerated it well.   She has a One Touch Ultra meter.  She is not checking sugars inconsistently: - am:  90s >> 90s >> 90s >> 90s >> 91-96 >> 91-100 - 2h after b'fast: 98-118 >> n/c - lunch:  90-120 >> n/c - 2h after lunch:  up to 130 >> <130 >> n/c - dinner  90s-130s >> n/c >> 121-130 >>  <124 - 2h after dinner: 148 >> up to 130 >> <130 >> n/c Highest: 150s >> 148 (Christmas) >> 130 >> 124  -No CKD, last BUN/creatinine:  03/23/2021: 13/1.02 Lab Results  Component Value Date   BUN 14 03/19/2020   CREATININE 0.98 03/19/2020   Normal ACR: Lab Results  Component Value Date   MICRALBCREAT 2.3 03/19/2020    MICRALBCREAT 2.0 10/26/2018   MICRALBCREAT 3.4 12/11/2017   MICRALBCREAT 10.3 08/03/2016   MICRALBCREAT 4.0 07/10/2015   MICRALBCREAT 5.7 02/05/2014   MICRALBCREAT 9.4 07/11/2013   MICRALBCREAT 36.3 (H) 03/07/2013   She had proteinuria after pregnancy 2010.   -+ HL. Last lipids: 03/23/2021: 177/134/37/116 Lab Results  Component Value Date   CHOL 184 03/19/2020   HDL 41.30 03/19/2020   LDLCALC 117 (H) 03/19/2020   LDLDIRECT 97.0 06/05/2018   TRIG 126.0 03/19/2020   CHOLHDL 4 03/19/2020  On pravastatin 20.  - Last eye exam 05/04/2021: No DR reportedly.  - no numbness and tingling in her feet.  H/o Graves ds.:  Reviewed her treatment history: 02/2014: started MMI 10 mg twice a day MMI tapered down 09/2015: stopped [place] 07/2016: restarted MMI 5 mg daily 06/2017: MMI decrease to 2.5 mg daily  11/2017: MMI stopped again 04/2019: MMI restarted at 5 mg twice a day 06/2019: MMI decrease to 5 mg daily 08/2019: MMI decrease to 2.5 mg daily -did not come back for labs...  Thyroid uptake (04/29/2020): Increased, at 44.5% RAI treatment (06/03/2020) Levothyroxine started 08/2020. Metoprolol stopped 01/2021.  She is on levothyroxine 100 mcg daily (dose increased 01/2021, decreased 03/2021) - in am - fasting - at least 1h from b'fast - no calcium - no iron - + multivitamins after lunch - no PPIs - not on Biotin -  on B12 at night  Reviewed her TFTs: 03/23/2021: TSH 0.057 Lab Results  Component Value Date   TSH 13.71 (H) 01/27/2021   TSH 43.95 (H) 11/12/2020   TSH 6.43 (H) 08/18/2020   TSH 0.16 (L) 07/10/2020   TSH 1.69 03/19/2020   FREET4 0.65 01/27/2021   FREET4 0.40 (L) 11/12/2020   FREET4 0.37 (L) 08/18/2020   FREET4 1.42 07/10/2020   FREET4 0.67 03/19/2020   Lab Results  Component Value Date   T3FREE 2.5 08/18/2020   T3FREE 4.3 (H) 07/10/2020   T3FREE 3.2 03/19/2020   T3FREE 3.0 09/09/2019   T3FREE 3.0 07/15/2019   T3FREE 6.0 (H) 04/24/2019   T3FREE 3.2  10/26/2018   T3FREE 3.2 06/05/2018   T3FREE 3.5 01/30/2018   T3FREE 4.1 12/11/2017   At last check, her Graves' antibodies were again detectable: Lab Results  Component Value Date   TSI 221 (H) 03/19/2020   TSI <89 01/30/2018   TSI 210 (H) 04/06/2017   TSI 152 (H) 02/11/2016   TSI 188 (H) 12/18/2013   She also has a history of PCOS, HTN, vitamin D deficiency - on 2000 units daily.  Lab Results  Component Value Date   VD25OH 19.29 (L) 08/03/2016   VD25OH 15.59 (L) 05/21/2014   ROS: + See HPI  I reviewed pt's medications, allergies, PMH, social hx, family hx, and changes were documented in the history of present illness. Otherwise, unchanged from my initial visit note.  Past Medical History:  Diagnosis Date   DM2 (diabetes mellitus, type 2) (Early) 01/2013 dx   Hypertension    Other and unspecified hyperlipidemia    PCOS (polycystic ovarian syndrome)    Past Surgical History:  Procedure Laterality Date   CESAREAN SECTION  2010   Social History   Socioeconomic History   Marital status: Married    Spouse name: Not on file   Number of children: Not on file   Years of education: Not on file   Highest education level: Not on file  Occupational History   Not on file  Tobacco Use   Smoking status: Never   Smokeless tobacco: Never  Substance and Sexual Activity   Alcohol use: No   Drug use: No   Sexual activity: Yes    Partners: Male  Other Topics Concern   Not on file  Social History Narrative   Regular exercise: no   Caffeine use: 2 soda's daily   Lives with spouse and son   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   Social Determinants of Radio broadcast assistant Strain: Not on file  Food Insecurity: Not on file  Transportation Needs: Not on file  Physical Activity: Not on file  Stress: Not on file  Social Connections: Not on file  Intimate Partner Violence: Not on file   Current Outpatient Medications on File Prior to Visit  Medication Sig Dispense Refill    amLODipine (NORVASC) 5 MG tablet TAKE 1 TABLET BY MOUTH EVERY DAY NEEDS APPT FOR REFILLS 90 tablet 1   glucose blood test strip Use 2 times day as instructed - One Touch Ultra 100 each 11   ibuprofen (ADVIL) 800 MG tablet TAKE 1 TABLET BY MOUTH EVERY 8 HOURS AS NEEDED 90 tablet 1   levonorgestrel (MIRENA) 20 MCG/24HR IUD 1 Intra Uterine Device (1 each total) by Intrauterine route once. Placed 07/19/13 - gyn 1 each 0   levothyroxine (SYNTHROID) 100 MCG tablet Take 1 tablet 6 days a week and skip Sunday 45  tablet 2   levothyroxine (SYNTHROID) 125 MCG tablet Take 1 tablet (125 mcg total) by mouth daily. 45 tablet 3   metFORMIN (GLUCOPHAGE-XR) 500 MG 24 hr tablet TAKE 2 TABLETS BY MOUTH TWICE A DAY 180 tablet 1   pravastatin (PRAVACHOL) 20 MG tablet Take 1 tablet daily 90 tablet 3   tirzepatide (MOUNJARO) 7.5 MG/0.5ML Pen Inject 7.5 mg into the skin once a week. 2 mL 5   topiramate (TOPAMAX) 25 MG tablet Take 2 tablets (50 mg total) by mouth daily. 180 tablet 3   No current facility-administered medications on file prior to visit.   Allergies  Allergen Reactions   Invokana [Canagliflozin]     Recurrent yeast infections   Family History  Problem Relation Age of Onset   Diabetes Mother        Type II   Thyroid disease Mother    Hypertension Mother    Hyperlipidemia Mother    Diabetes Father        TYPE II   Hypertension Father    Hyperlipidemia Father    Heart disease Father        CAD with stent   Hyperlipidemia Maternal Grandmother    Hypertension Maternal Grandmother    Obesity Maternal Grandmother    Diabetes Maternal Grandmother    Cancer Paternal Grandmother 94       colon cancer   Diabetes Paternal Grandmother    PE: BP 120/82 (BP Location: Right Arm, Patient Position: Sitting, Cuff Size: Normal)    Pulse (!) 109    Ht 5\' 2"  (1.575 m)    Wt 209 lb (94.8 kg)    SpO2 99%    BMI 38.23 kg/m    Wt Readings from Last 3 Encounters:  05/27/21 209 lb (94.8 kg)  01/27/21 221 lb 6.4  oz (100.4 kg)  09/17/20 215 lb 9.6 oz (97.8 kg)   Constitutional: overweight, in NAD Eyes: PERRLA, EOMI, no exophthalmos ENT: moist mucous membranes, no thyromegaly, no cervical lymphadenopathy Cardiovascular: tachycardia, RR, No MRG Respiratory: CTA B Musculoskeletal: no deformities, strength intact in all 4 Skin: moist, warm, no rashes Neurological: no tremor with outstretched hands, DTR normal in all 4  ASSESSMENT: 1. DM2, non-insulin-dependent, uncontrolled, without complications  2. Graves ds - 03/12/2014: Thyroid uptake and scan: Consistent with Graves' disease: The 24 hr uptake by the thyroid gland is 65%. Normal 24 hr uptake is 10-30%. On thyroid imaging, the thyroid activity appears diffusely increased. No focal hot or cold nodules are identified.  3.  Hyperlipidemia  4. Obesity class II  5.  Vitamin D deficiency  PLAN:  1. Patient with well-controlled type 2 diabetes, on metformin and weekly GLP-1/GIP receptor agonist.  She was on Ozempic at last visit but she was still gaining weight so we switched to [facility] for stronger effect on weight.  She is tolerating this well.  She is on 7.5 mg weekly. -At last visit, HbA1c was slightly higher, at 6.4%. -At today's visit, she is not checking sugars consistently, but whenever she does, they are at goal.  We will continue the current regimen. -I advised her to: Patient Instructions  Please continue: - Metformin ER 1000 mg with b'fast - Mounjaro 7.5 mg weekly  Please continue levothyroxine 100 mcg daily.  Take the thyroid hormone every day, with water, at least 30 minutes before breakfast, separated by at least 4 hours from: - acid reflux medications - calcium - iron - multivitamins  Please stop at the lab.  Please return  in 4 months with your sugar log.   - we checked her HbA1c: 5.8% (decreased) - advised to check sugars at different times of the day - 1x a day, rotating check times - advised for yearly eye exams  >> she is not UTD - she is due for annual labs - she is due for foot exam - return to clinic in 4 months  2.  Post ablative hypothyroidism -Patient with history of recurrent Graves' disease, with minimal response to methimazole.  A thyroid uptake was elevated, at 44.5% in 04/2020.  She had RAI treatment in 05/2020. -At last visit, we stopped metoprolol -at today's visit, her heart rate is high.  She occasionally has palpitations.  She has evaluation by cardiology coming up. -She did develop post ablative hypothyroidism and we started levothyroxine - latest thyroid labs reviewed with pt. >> TSH was high so we increased the dose of her levothyroxine from 100 to 125 mcg daily.  However, she had another TSH checked by PCP and this was suppressed at 0.057.  At that time, I advised her to reduce the dose of LT4. - she is now on LT4 100 mcg daily - pt feels good on this dose - we discussed about taking the thyroid hormone every day, with water, >30 minutes before breakfast, separated by >4 hours from acid reflux medications, calcium, iron, multivitamins. Pt. is taking it correctly. - will check thyroid tests today: TSH and fT4 - If labs are abnormal, she will need to return for repeat TFTs in 1.5 months  3.  Hyperlipidemia -Reviewed latest lipid panel from 03/2021: LDL above target, HDL slightly low -Currently on pravastatin 20 mg daily.  We tried to increase the dose to 40 mg in the past but she developed joint pains. -She is due for another lipid panel  4.  Obesity class II -Previously on Belviq (but no significant weight loss with this), however, this is now taken off the market -She was doing well on Qsymia but this was not approved by her insurance -She developed injection site reaction to Bydureon in the past -She initially lost a significant amount of weight (approximately 18 pounds since starting Ozempic) but then gained them back -At last visit she was on Ozempic, but we switched to  [facility], for stronger effect -At today's visit she lost 12 pounds  5.  Vitamin D deficiency -Latest vitamin D level was quite low, in 2018 -She was previously on high-dose vitamin D but currently on 2000 vitamin D3 daily -We will recheck the level today at her request.  Needs refills of LT4.  Component     Latest Ref Rng & Units 05/27/2021  T4,Free(Direct)     0.60 - 1.60 ng/dL 1.00  TSH     0.35 - 5.50 uIU/mL 1.85  VITD     30.00 - 100.00 ng/mL 27.42 (L)  TFTs are normal now.  Vitamin D level is low.  We will increase vitamin D supplement to 4000 units daily.  [name], MD PhD [facility]

## 2021-05-27 NOTE — Patient Instructions (Signed)
Please continue: - Metformin ER 1000 mg with b'fast - Mounjaro 7.5 mg weekly  Please continue levothyroxine 100 mcg daily.  Take the thyroid hormone every day, with water, at least 30 minutes before breakfast, separated by at least 4 hours from: - acid reflux medications - calcium - iron - multivitamins  Please stop at the lab.  Please return in 4 months with your sugar log.

## 2021-06-11 ENCOUNTER — Ambulatory Visit
Admission: RE | Admit: 2021-06-11 | Discharge: 2021-06-11 | Disposition: A | Discharge status: Home / Self Care | Payer: No Typology Code available for payment source | Source: Ambulatory Visit | Attending: Obstetrics and Gynecology | Admitting: Obstetrics and Gynecology

## 2021-06-11 DIAGNOSIS — Z8249 Family history of ischemic heart disease and other diseases of the circulatory system: Secondary | ICD-10-CM | POA: Diagnosis not present

## 2021-06-11 DIAGNOSIS — E785 Hyperlipidemia, unspecified: Secondary | ICD-10-CM | POA: Diagnosis not present

## 2021-06-28 ENCOUNTER — Encounter: Payer: Self-pay | Admitting: Internal Medicine

## 2021-06-28 DIAGNOSIS — E119 Type 2 diabetes mellitus without complications: Secondary | ICD-10-CM

## 2021-06-28 MED ORDER — TIRZEPATIDE 5 MG/0.5ML ~~LOC~~ SOAJ: 5.0000 mg | SUBCUTANEOUS | 1 refills | Status: DC

## 2021-08-26 DIAGNOSIS — E785 Hyperlipidemia, unspecified: Secondary | ICD-10-CM | POA: Diagnosis not present

## 2021-08-26 DIAGNOSIS — E559 Vitamin D deficiency, unspecified: Secondary | ICD-10-CM | POA: Diagnosis not present

## 2021-08-26 DIAGNOSIS — E890 Postprocedural hypothyroidism: Secondary | ICD-10-CM | POA: Diagnosis not present

## 2021-08-26 DIAGNOSIS — I152 Hypertension secondary to endocrine disorders: Secondary | ICD-10-CM | POA: Diagnosis not present

## 2021-08-26 DIAGNOSIS — E1159 Type 2 diabetes mellitus with other circulatory complications: Secondary | ICD-10-CM | POA: Diagnosis not present

## 2021-08-26 DIAGNOSIS — D563 Thalassemia minor: Secondary | ICD-10-CM | POA: Diagnosis not present

## 2021-08-26 DIAGNOSIS — G4719 Other hypersomnia: Secondary | ICD-10-CM | POA: Diagnosis not present

## 2021-08-26 DIAGNOSIS — E1169 Type 2 diabetes mellitus with other specified complication: Secondary | ICD-10-CM | POA: Diagnosis not present

## 2021-08-31 ENCOUNTER — Encounter: Payer: Self-pay | Admitting: Internal Medicine

## 2021-08-31 DIAGNOSIS — Z1231 Encounter for screening mammogram for malignant neoplasm of breast: Secondary | ICD-10-CM | POA: Diagnosis not present

## 2021-09-18 ENCOUNTER — Other Ambulatory Visit: Payer: Self-pay | Admitting: Internal Medicine

## 2021-09-18 DIAGNOSIS — E119 Type 2 diabetes mellitus without complications: Secondary | ICD-10-CM

## 2021-09-22 ENCOUNTER — Encounter: Payer: Self-pay | Admitting: Internal Medicine

## 2021-09-22 ENCOUNTER — Ambulatory Visit (INDEPENDENT_AMBULATORY_CARE_PROVIDER_SITE_OTHER): Payer: BC Managed Care – PPO | Admitting: Internal Medicine

## 2021-09-22 VITALS — BP 120/82 | HR 104 | Ht 62.0 in | Wt 219.0 lb

## 2021-09-22 DIAGNOSIS — E890 Postprocedural hypothyroidism: Secondary | ICD-10-CM | POA: Diagnosis not present

## 2021-09-22 DIAGNOSIS — E669 Obesity, unspecified: Secondary | ICD-10-CM | POA: Diagnosis not present

## 2021-09-22 DIAGNOSIS — E1169 Type 2 diabetes mellitus with other specified complication: Secondary | ICD-10-CM

## 2021-09-22 DIAGNOSIS — E559 Vitamin D deficiency, unspecified: Secondary | ICD-10-CM | POA: Diagnosis not present

## 2021-09-22 DIAGNOSIS — E785 Hyperlipidemia, unspecified: Secondary | ICD-10-CM

## 2021-09-22 DIAGNOSIS — E119 Type 2 diabetes mellitus without complications: Secondary | ICD-10-CM

## 2021-09-22 MED ORDER — TIRZEPATIDE 7.5 MG/0.5ML ~~LOC~~ SOAJ
7.5000 mg | SUBCUTANEOUS | 11 refills | Status: DC
  Filled 2022-09-15: qty 2, 28d supply, fill #0

## 2021-09-22 NOTE — Patient Instructions (Addendum)
Please continue: - Metformin ER 1000 mg with b'fast - Mounjaro 5 mg weekly - When available, increase Mounjaro to 7.5 mg weekly.  Please continue levothyroxine 100 mcg daily.  Take the thyroid hormone every day, with water, at least 30 minutes before breakfast, separated by at least 4 hours from: - acid reflux medications - calcium - iron - multivitamins  Please return in 6 months with your sugar log.

## 2021-09-22 NOTE — Progress Notes (Signed)
Patient ID: Ana [name], female   DOB: July 28, 1980, 41 y.o.   MRN: [id number]  HPI: Ana [name] is a 41 y.o.-year-old female, returning for f/u DM2, dx summer 2014, non-insulin-dependent, controlled, without long-term complications and also Graves ds. Last visit 4 months ago.   Interim history: No blurry vision, nausea, chest pain. Before last visit, she lost 12 pounds after starting Mounjaro and increasing the dose to 7.5 mg weekly. She gained 10 lbs since last OV after having to decrease the Mounjaro dose to 5 mg weekly (due to availability).  Reviewed HbA1c levels: 08/26/2021: HbA1c 5.9% Lab Results  Component Value Date   HGBA1C 6.4 (A) 01/27/2021   HGBA1C 6.1 (A) 09/17/2020   HGBA1C 5.7 (A) 03/19/2020  Previously 10.5% (10/2012).  Previously 6.5%.   Patient is on a regimen of: - Metformin ER 1000 >> 500 mg twice a day >> 1000 mg in am - Mounjaro 7.5 >> 5 mg weekly  Stopped Bydureon due to reaction to the injection sites. She was previously on Ozempic before switching to [facility]. We had to stop Invokana 100 mg b/c repeated yeast inf. (09/26/2013) She was on JanuMet when she was prediabetic >> tolerated it well.   She has a One Touch Ultra meter.  She is not checking sugars inconsistently - highest 131 (after dinner). From before: - am:  90s >> 90s >> 90s >> 90s >> 91-96 >> 91-100 - 2h after b'fast: 98-118 >> n/c - lunch:  90-120 >> n/c - 2h after lunch:  up to 130 >> <130 >> n/c - dinner  90s-130s >> n/c >> 121-130 >>  <124 - 2h after dinner: 148 >> up to 130 >> <130 >> n/c Highest: 150s >> 148 (Christmas) >> 130 >> 124  -No CKD, last BUN/creatinine:   Ref Range & Units 08/26/2021  Glucose 70 - 99 mg/dL 89   BUN 6 - 24 mg/dL 12   Creatinine 0.57 - 1.00 mg/dL 0.88   eGFR >59 mL/min/1.73 85   BUN/Creatinine Ratio 9 - 23 14   Sodium 134 - 144 mmol/L 139   Potassium 3.5 - 5.2 mmol/L 4.4   Chloride 96 - 106 mmol/L 105   CO2 20 - 29 mmol/L 20   CALCIUM 8.7 -  10.2 mg/dL 9.4   Total Protein 6.0 - 8.5 g/dL 7.4   Albumin, Serum 3.8 - 4.8 g/dL 4.1   Globulin, Total 1.5 - 4.5 g/dL 3.3   Albumin/Globulin Ratio 1.2 - 2.2 1.2   Total Bilirubin 0.0 - 1.2 mg/dL <0.2   Alkaline Phosphatase 44 - 121 IU/L 82   AST 0 - 40 IU/L 17   ALT (SGPT) 0 - 32 IU/L 10   Resulting Agency  [facility]   03/23/2021: 13/1.02 Lab Results  Component Value Date   BUN 14 03/19/2020   CREATININE 0.98 03/19/2020   Normal ACR: Lab Results  Component Value Date   MICRALBCREAT 2.3 03/19/2020   MICRALBCREAT 2.0 10/26/2018   MICRALBCREAT 3.4 12/11/2017   MICRALBCREAT 10.3 08/03/2016   MICRALBCREAT 4.0 07/10/2015   MICRALBCREAT 5.7 02/05/2014   MICRALBCREAT 9.4 07/11/2013   MICRALBCREAT 36.3 (H) 03/07/2013   She had proteinuria after pregnancy 2010.   -+ HL. Last lipids: 08/26/2021: 222/134/49/149 03/23/2021: 177/134/37/116 Lab Results  Component Value Date   CHOL 184 03/19/2020   HDL 41.30 03/19/2020   LDLCALC 117 (H) 03/19/2020   LDLDIRECT 97.0 06/05/2018   TRIG 126.0 03/19/2020   CHOLHDL 4 03/19/2020  On pravastatin 20. Will  switch to ipitor soon.  - Last eye exam 05/04/2021: No DR reportedly.  - no numbness and tingling in her feet.  Last foot exam 05/27/2021.  H/o Graves ds.:  Reviewed her treatment history: 02/2014: started MMI 10 mg twice a day MMI tapered down 09/2015: stopped [place] 07/2016: restarted MMI 5 mg daily 06/2017: MMI decrease to 2.5 mg daily  11/2017: MMI stopped again 04/2019: MMI restarted at 5 mg twice a day 06/2019: MMI decrease to 5 mg daily 08/2019: MMI decrease to 2.5 mg daily -did not come back for labs...  Thyroid uptake (04/29/2020): Increased, at 44.5% RAI treatment (06/03/2020) Levothyroxine started 08/2020. Metoprolol stopped 01/2021.  She is on levothyroxine 100 mcg daily (dose increased 01/2021, decreased 03/2021) - in am - fasting - at least 1h from b'fast - no calcium - no iron - + multivitamins after lunch -  no PPIs - not on Biotin - on B12 at night  Reviewed her TFTs: 08/26/2021: TSH 2.79 Lab Results  Component Value Date   TSH 1.85 05/27/2021   TSH 13.71 (H) 01/27/2021   TSH 43.95 (H) 11/12/2020   TSH 6.43 (H) 08/18/2020   TSH 0.16 (L) 07/10/2020   FREET4 1.00 05/27/2021   FREET4 0.65 01/27/2021   FREET4 0.40 (L) 11/12/2020   FREET4 0.37 (L) 08/18/2020   FREET4 1.42 07/10/2020   Lab Results  Component Value Date   T3FREE 2.5 08/18/2020   T3FREE 4.3 (H) 07/10/2020   T3FREE 3.2 03/19/2020   T3FREE 3.0 09/09/2019   T3FREE 3.0 07/15/2019   T3FREE 6.0 (H) 04/24/2019   T3FREE 3.2 10/26/2018   T3FREE 3.2 06/05/2018   T3FREE 3.5 01/30/2018   T3FREE 4.1 12/11/2017  03/23/2021: TSH 0.057  At last check, her Graves' antibodies were again detectable: Lab Results  Component Value Date   TSI 221 (H) 03/19/2020   TSI <89 01/30/2018   TSI 210 (H) 04/06/2017   TSI 152 (H) 02/11/2016   TSI 188 (H) 12/18/2013   She also has a history of PCOS, HTN, vitamin D deficiency: 08/26/2021 : vitamin D 23 Lab Results  Component Value Date   VD25OH 27.42 (L) 05/27/2021   VD25OH 19.29 (L) 08/03/2016   VD25OH 15.59 (L) 05/21/2014   She was started on Ergocalciferol 50,000 units weekly - started 2 weeks ago by PCP.  Her B12 was low normal: 08/26/2021: vitamin B12 257  ROS: + See HPI  I reviewed pt's medications, allergies, PMH, social hx, family hx, and changes were documented in the history of present illness. Otherwise, unchanged from my initial visit note.  Past Medical History:  Diagnosis Date   DM2 (diabetes mellitus, type 2) ([place]) 01/2013 dx   Hypertension    Other and unspecified hyperlipidemia    PCOS (polycystic ovarian syndrome)    Past Surgical History:  Procedure Laterality Date   CESAREAN SECTION  2010   Social History   Socioeconomic History   Marital status: Married    Spouse name: Not on file   Number of children: Not on file   Years of education: Not on file    Highest education level: Not on file  Occupational History   Not on file  Tobacco Use   Smoking status: Never   Smokeless tobacco: Never  Substance and Sexual Activity   Alcohol use: No   Drug use: No   Sexual activity: Yes    Partners: Male  Other Topics Concern   Not on file  Social History Narrative   Regular exercise:  no   Caffeine use: 2 soda's daily   Lives with spouse and son   Works at [employer] as Psychologist, clinical of Radio broadcast assistant Strain: Not on [employer] Insecurity: Not on file  Transportation Needs: Not on file  Physical Activity: Not on file  Stress: Not on file  Social Connections: Not on file  Intimate Partner Violence: Not on file   Current Outpatient Medications on File Prior to Visit  Medication Sig Dispense Refill   amLODipine (NORVASC) 5 MG tablet TAKE 1 TABLET BY MOUTH EVERY DAY NEEDS APPT FOR REFILLS 90 tablet 1   glucose blood test strip Use 2 times day as instructed - One Touch Ultra 100 each 11   ibuprofen (ADVIL) 800 MG tablet TAKE 1 TABLET BY MOUTH EVERY 8 HOURS AS NEEDED 90 tablet 1   levonorgestrel (MIRENA) 20 MCG/24HR IUD 1 Intra Uterine Device (1 each total) by Intrauterine route once. Placed 07/19/13 - gyn 1 each 0   levothyroxine (SYNTHROID) 100 MCG tablet Take 1 tablet (100 mcg total) by mouth daily before breakfast. 90 tablet 3   metFORMIN (GLUCOPHAGE-XR) 500 MG 24 hr tablet TAKE 2 TABLETS BY MOUTH TWICE A DAY 180 tablet 1   MOUNJARO 5 MG/0.5ML Pen INJECT 5 MG SUBCUTANEOUSLY WEEKLY 2 mL 1   pravastatin (PRAVACHOL) 20 MG tablet Take 1 tablet daily 90 tablet 3   tirzepatide (MOUNJARO) 7.5 MG/0.5ML Pen Inject 7.5 mg into the skin once a week. 2 mL 5   topiramate (TOPAMAX) 25 MG tablet Take 2 tablets (50 mg total) by mouth daily. 180 tablet 3   No current facility-administered medications on file prior to visit.   Allergies  Allergen Reactions   Invokana [Canagliflozin]     Recurrent yeast infections   Family  History  Problem Relation Age of Onset   Diabetes Mother        Type II   Thyroid disease Mother    Hypertension Mother    Hyperlipidemia Mother    Diabetes Father        TYPE II   Hypertension Father    Hyperlipidemia Father    Heart disease Father        CAD with stent   Hyperlipidemia Maternal Grandmother    Hypertension Maternal Grandmother    Obesity Maternal Grandmother    Diabetes Maternal Grandmother    Cancer Paternal Grandmother 94       colon cancer   Diabetes Paternal Grandmother    PE: BP 120/82 (BP Location: Right Arm, Patient Position: Sitting, Cuff Size: Normal)   Pulse (!) 104   Ht 5' 2"  (1.575 m)   Wt 219 lb (99.3 kg)   SpO2 99%   BMI 40.06 kg/m    Wt Readings from Last 3 Encounters:  09/22/21 219 lb (99.3 kg)  05/27/21 209 lb (94.8 kg)  01/27/21 221 lb 6.4 oz (100.4 kg)   Constitutional: overweight, in NAD Eyes: EOMI, no exophthalmos ENT: moist mucous membranes, no masses palpated in neck, no cervical lymphadenopathy Cardiovascular: tachycardia, RR, No MRG Respiratory: CTA B Musculoskeletal: no deformities Skin: moist, warm, no rashes Neurological: no tremor with outstretched hands  ASSESSMENT: 1. DM2, non-insulin-dependent, uncontrolled, without complications  2. Graves ds - 03/12/2014: Thyroid uptake and scan: Consistent with Graves' disease: The 24 hr uptake by the thyroid gland is 65%. Normal 24 hr uptake is 10-30%. On thyroid imaging, the thyroid activity appears diffusely increased. No focal hot or cold nodules are identified.  3.  Hyperlipidemia  4. Obesity class II  5.  Vitamin D deficiency  PLAN:  1. Patient with well-controlled type 2 diabetes, on metformin and weekly GLP-1/GIP receptor agonist.  She was on Ozempic at last visit but she was still gaining weight so we switched to [facility] for stronger effect on weight.  She is tolerating this well.  She is on 5 mg weekly, due to availability, decreased from 7.5 mg weekly, which  she was taking at last visit. -At last visit she was not checking sugars consistently but whenever she was checking, they were at goal.  We did not change her regimen.  At this visit, she again checks very rarely, she remembers 1 blood sugar that was 131 when checked after dinner.  We again discussed about the importance of checking blood sugars every day or at least every other day, rotating check times. -She recently had an HbA1c checked and this was 5.9% on 08/26/2021.  This has improved from 6.4% in 01/2021. -At today's visit, -I advised her to: Patient Instructions  Please continue: - Metformin ER 1000 mg with b'fast - Mounjaro 5 mg weekly - When available, increase Mounjaro to 7.5 mg weekly.  Please continue levothyroxine 100 mcg daily.  Take the thyroid hormone every day, with water, at least 30 minutes before breakfast, separated by at least 4 hours from: - acid reflux medications - calcium - iron - multivitamins  Please return in 6 months with your sugar log.   - advised to restart check sugars at different times of the day - 1x a day, rotating check times - advised for yearly eye exams >> she is UTD - return to clinic in 6 months  2.  Post ablative hypothyroidism -Patient with history of recurrent Graves' disease, with minimal response to methimazole.  A thyroid uptake was elevated, at 44.5% in 04/2020.  She had RAI treatment in 05/2020.  She developed post ablative hypothyroidism. -At last visit she had occasional palpitations and high heart rate after stopping metoprolol.  She saw cardiology since. - latest thyroid labs reviewed with pt. >> normal last month - she continues on LT4 100 mcg daily - pt feels good on this dose. - we discussed about taking the thyroid hormone every day, with water, >30 minutes before breakfast, separated by >4 hours from acid reflux medications, calcium, iron, multivitamins. Pt. is taking it correctly.  3.  Hyperlipidemia - Reviewed latest  lipid panel from 03/2021: LDL above goal, HDL slightly low.  She had another lipid panel obtained last month by PCP and the LDL was even higher, while HDL was normal.  She will have an appointment with PCP to discuss about the last where she was told that she would most likely be switched to Lipitor. -As of now, she continues pravastatin 20 mg daily.  Of note, we tried to increase the dose to 40 mg daily in the past but she developed joint pains  4.  Obesity class II -Previously on Belviq (but no significant weight loss with this), however, this is now taken off the market -She was doing well on Qsymia but this was not approved by her insurance -She developed injection site reaction to Bydureon in the past -She initially lost a significant amount of weight (approximately 18 pounds since starting Ozempic) but then gained them back -We switched to [facility] before last visit >> she lost 12 pounds - At today's visit, she gained 10 pounds.  We discussed that whenever available at the  pharmacy, to increase the dose of Mounjaro to 7.5 mg weekly.  For now, we will continue the 5 mg weekly.  5.  Vitamin D deficiency -Latest vitamin D level was quite low, in 2018 -At last visit she was on 2000 units vitamin D daily, previously on high-dose vitamin D -At that time, vitamin D was slightly low at 27.42.  However, since last visit, she establish care with a PCP at [facility] and she had another vitamin D level checked >> low, at 23.  She was started on ergocalciferol 50,000 units weekly. -We will continue to follow-up for this by PCP  [name], MD PhD [facility] Endocrinology

## 2021-10-20 DIAGNOSIS — Z713 Dietary counseling and surveillance: Secondary | ICD-10-CM | POA: Diagnosis not present

## 2021-10-20 DIAGNOSIS — I152 Hypertension secondary to endocrine disorders: Secondary | ICD-10-CM | POA: Diagnosis not present

## 2021-10-20 DIAGNOSIS — Z6841 Body Mass Index (BMI) 40.0 and over, adult: Secondary | ICD-10-CM | POA: Diagnosis not present

## 2021-10-20 DIAGNOSIS — E668 Other obesity: Secondary | ICD-10-CM | POA: Diagnosis not present

## 2021-10-20 DIAGNOSIS — E1169 Type 2 diabetes mellitus with other specified complication: Secondary | ICD-10-CM | POA: Diagnosis not present

## 2021-10-20 DIAGNOSIS — E785 Hyperlipidemia, unspecified: Secondary | ICD-10-CM | POA: Diagnosis not present

## 2021-10-20 DIAGNOSIS — Z13228 Encounter for screening for other metabolic disorders: Secondary | ICD-10-CM | POA: Diagnosis not present

## 2021-10-20 DIAGNOSIS — E1159 Type 2 diabetes mellitus with other circulatory complications: Secondary | ICD-10-CM | POA: Diagnosis not present

## 2021-10-20 DIAGNOSIS — Z7182 Exercise counseling: Secondary | ICD-10-CM | POA: Diagnosis not present

## 2021-11-10 DIAGNOSIS — E1159 Type 2 diabetes mellitus with other circulatory complications: Secondary | ICD-10-CM | POA: Diagnosis not present

## 2021-11-10 DIAGNOSIS — E1169 Type 2 diabetes mellitus with other specified complication: Secondary | ICD-10-CM | POA: Diagnosis not present

## 2021-11-10 DIAGNOSIS — E119 Type 2 diabetes mellitus without complications: Secondary | ICD-10-CM | POA: Diagnosis not present

## 2021-12-01 DIAGNOSIS — I152 Hypertension secondary to endocrine disorders: Secondary | ICD-10-CM | POA: Diagnosis not present

## 2021-12-01 DIAGNOSIS — E890 Postprocedural hypothyroidism: Secondary | ICD-10-CM | POA: Diagnosis not present

## 2021-12-01 DIAGNOSIS — E559 Vitamin D deficiency, unspecified: Secondary | ICD-10-CM | POA: Diagnosis not present

## 2021-12-01 DIAGNOSIS — D563 Thalassemia minor: Secondary | ICD-10-CM | POA: Diagnosis not present

## 2021-12-01 DIAGNOSIS — E785 Hyperlipidemia, unspecified: Secondary | ICD-10-CM | POA: Diagnosis not present

## 2021-12-01 DIAGNOSIS — E1169 Type 2 diabetes mellitus with other specified complication: Secondary | ICD-10-CM | POA: Diagnosis not present

## 2021-12-01 DIAGNOSIS — E1159 Type 2 diabetes mellitus with other circulatory complications: Secondary | ICD-10-CM | POA: Diagnosis not present

## 2021-12-30 ENCOUNTER — Other Ambulatory Visit: Payer: Self-pay | Admitting: Internal Medicine

## 2021-12-30 DIAGNOSIS — I152 Hypertension secondary to endocrine disorders: Secondary | ICD-10-CM | POA: Diagnosis not present

## 2021-12-30 DIAGNOSIS — Z6841 Body Mass Index (BMI) 40.0 and over, adult: Secondary | ICD-10-CM | POA: Diagnosis not present

## 2021-12-30 DIAGNOSIS — E0500 Thyrotoxicosis with diffuse goiter without thyrotoxic crisis or storm: Secondary | ICD-10-CM | POA: Diagnosis not present

## 2021-12-30 DIAGNOSIS — F329 Major depressive disorder, single episode, unspecified: Secondary | ICD-10-CM | POA: Diagnosis not present

## 2021-12-30 DIAGNOSIS — E119 Type 2 diabetes mellitus without complications: Secondary | ICD-10-CM

## 2021-12-30 DIAGNOSIS — E1159 Type 2 diabetes mellitus with other circulatory complications: Secondary | ICD-10-CM | POA: Diagnosis not present

## 2021-12-30 DIAGNOSIS — E785 Hyperlipidemia, unspecified: Secondary | ICD-10-CM | POA: Diagnosis not present

## 2021-12-30 DIAGNOSIS — E611 Iron deficiency: Secondary | ICD-10-CM | POA: Diagnosis not present

## 2022-01-07 DIAGNOSIS — G4710 Hypersomnia, unspecified: Secondary | ICD-10-CM | POA: Diagnosis not present

## 2022-01-07 DIAGNOSIS — I152 Hypertension secondary to endocrine disorders: Secondary | ICD-10-CM | POA: Diagnosis not present

## 2022-01-07 DIAGNOSIS — E1159 Type 2 diabetes mellitus with other circulatory complications: Secondary | ICD-10-CM | POA: Diagnosis not present

## 2022-01-07 DIAGNOSIS — Z6841 Body Mass Index (BMI) 40.0 and over, adult: Secondary | ICD-10-CM | POA: Diagnosis not present

## 2022-01-07 DIAGNOSIS — E119 Type 2 diabetes mellitus without complications: Secondary | ICD-10-CM | POA: Diagnosis not present

## 2022-01-07 DIAGNOSIS — Z23 Encounter for immunization: Secondary | ICD-10-CM | POA: Diagnosis not present

## 2022-01-20 DIAGNOSIS — Z713 Dietary counseling and surveillance: Secondary | ICD-10-CM | POA: Diagnosis not present

## 2022-01-20 DIAGNOSIS — E119 Type 2 diabetes mellitus without complications: Secondary | ICD-10-CM | POA: Diagnosis not present

## 2022-01-26 DIAGNOSIS — L708 Other acne: Secondary | ICD-10-CM | POA: Diagnosis not present

## 2022-01-31 ENCOUNTER — Encounter: Payer: Self-pay | Admitting: Internal Medicine

## 2022-01-31 DIAGNOSIS — Z6838 Body mass index (BMI) 38.0-38.9, adult: Secondary | ICD-10-CM | POA: Diagnosis not present

## 2022-01-31 DIAGNOSIS — I152 Hypertension secondary to endocrine disorders: Secondary | ICD-10-CM | POA: Diagnosis not present

## 2022-01-31 DIAGNOSIS — E1159 Type 2 diabetes mellitus with other circulatory complications: Secondary | ICD-10-CM | POA: Diagnosis not present

## 2022-01-31 DIAGNOSIS — D563 Thalassemia minor: Secondary | ICD-10-CM | POA: Diagnosis not present

## 2022-01-31 DIAGNOSIS — D508 Other iron deficiency anemias: Secondary | ICD-10-CM | POA: Diagnosis not present

## 2022-01-31 DIAGNOSIS — Z6841 Body Mass Index (BMI) 40.0 and over, adult: Secondary | ICD-10-CM | POA: Diagnosis not present

## 2022-02-01 ENCOUNTER — Other Ambulatory Visit: Payer: Self-pay | Admitting: Internal Medicine

## 2022-02-02 ENCOUNTER — Other Ambulatory Visit: Payer: Self-pay | Admitting: Internal Medicine

## 2022-02-02 DIAGNOSIS — F54 Psychological and behavioral factors associated with disorders or diseases classified elsewhere: Secondary | ICD-10-CM | POA: Diagnosis not present

## 2022-02-02 DIAGNOSIS — Z7189 Other specified counseling: Secondary | ICD-10-CM | POA: Diagnosis not present

## 2022-02-03 DIAGNOSIS — E785 Hyperlipidemia, unspecified: Secondary | ICD-10-CM | POA: Diagnosis not present

## 2022-02-03 DIAGNOSIS — E1169 Type 2 diabetes mellitus with other specified complication: Secondary | ICD-10-CM | POA: Diagnosis not present

## 2022-02-03 DIAGNOSIS — E668 Other obesity: Secondary | ICD-10-CM | POA: Diagnosis not present

## 2022-02-08 DIAGNOSIS — G4733 Obstructive sleep apnea (adult) (pediatric): Secondary | ICD-10-CM | POA: Diagnosis not present

## 2022-02-09 DIAGNOSIS — I152 Hypertension secondary to endocrine disorders: Secondary | ICD-10-CM | POA: Diagnosis not present

## 2022-02-09 DIAGNOSIS — G4733 Obstructive sleep apnea (adult) (pediatric): Secondary | ICD-10-CM | POA: Diagnosis not present

## 2022-02-09 DIAGNOSIS — E1159 Type 2 diabetes mellitus with other circulatory complications: Secondary | ICD-10-CM | POA: Diagnosis not present

## 2022-02-10 DIAGNOSIS — E1159 Type 2 diabetes mellitus with other circulatory complications: Secondary | ICD-10-CM | POA: Diagnosis not present

## 2022-02-10 DIAGNOSIS — E0500 Thyrotoxicosis with diffuse goiter without thyrotoxic crisis or storm: Secondary | ICD-10-CM | POA: Diagnosis not present

## 2022-02-10 DIAGNOSIS — E611 Iron deficiency: Secondary | ICD-10-CM | POA: Diagnosis not present

## 2022-02-10 DIAGNOSIS — F329 Major depressive disorder, single episode, unspecified: Secondary | ICD-10-CM | POA: Diagnosis not present

## 2022-02-10 DIAGNOSIS — E785 Hyperlipidemia, unspecified: Secondary | ICD-10-CM | POA: Diagnosis not present

## 2022-02-10 DIAGNOSIS — E1169 Type 2 diabetes mellitus with other specified complication: Secondary | ICD-10-CM | POA: Diagnosis not present

## 2022-02-18 DIAGNOSIS — E785 Hyperlipidemia, unspecified: Secondary | ICD-10-CM | POA: Diagnosis not present

## 2022-02-18 DIAGNOSIS — I152 Hypertension secondary to endocrine disorders: Secondary | ICD-10-CM | POA: Diagnosis not present

## 2022-02-18 DIAGNOSIS — R002 Palpitations: Secondary | ICD-10-CM | POA: Diagnosis not present

## 2022-02-18 DIAGNOSIS — I447 Left bundle-branch block, unspecified: Secondary | ICD-10-CM | POA: Diagnosis not present

## 2022-02-18 DIAGNOSIS — E1169 Type 2 diabetes mellitus with other specified complication: Secondary | ICD-10-CM | POA: Diagnosis not present

## 2022-02-18 DIAGNOSIS — E1159 Type 2 diabetes mellitus with other circulatory complications: Secondary | ICD-10-CM | POA: Diagnosis not present

## 2022-02-18 DIAGNOSIS — Z01818 Encounter for other preprocedural examination: Secondary | ICD-10-CM | POA: Diagnosis not present

## 2022-02-18 DIAGNOSIS — R9431 Abnormal electrocardiogram [ECG] [EKG]: Secondary | ICD-10-CM | POA: Diagnosis not present

## 2022-02-18 DIAGNOSIS — I509 Heart failure, unspecified: Secondary | ICD-10-CM

## 2022-02-18 HISTORY — DX: Heart failure, unspecified: I50.9

## 2022-02-21 DIAGNOSIS — G4733 Obstructive sleep apnea (adult) (pediatric): Secondary | ICD-10-CM | POA: Diagnosis not present

## 2022-02-21 DIAGNOSIS — D508 Other iron deficiency anemias: Secondary | ICD-10-CM | POA: Diagnosis not present

## 2022-02-22 DIAGNOSIS — I420 Dilated cardiomyopathy: Secondary | ICD-10-CM | POA: Diagnosis not present

## 2022-02-23 DIAGNOSIS — Z713 Dietary counseling and surveillance: Secondary | ICD-10-CM | POA: Diagnosis not present

## 2022-02-25 DIAGNOSIS — I447 Left bundle-branch block, unspecified: Secondary | ICD-10-CM | POA: Diagnosis not present

## 2022-02-25 DIAGNOSIS — E1169 Type 2 diabetes mellitus with other specified complication: Secondary | ICD-10-CM | POA: Diagnosis not present

## 2022-02-25 DIAGNOSIS — R9431 Abnormal electrocardiogram [ECG] [EKG]: Secondary | ICD-10-CM | POA: Diagnosis not present

## 2022-02-25 DIAGNOSIS — E1159 Type 2 diabetes mellitus with other circulatory complications: Secondary | ICD-10-CM | POA: Diagnosis not present

## 2022-02-26 DIAGNOSIS — R9431 Abnormal electrocardiogram [ECG] [EKG]: Secondary | ICD-10-CM | POA: Diagnosis not present

## 2022-02-26 DIAGNOSIS — I152 Hypertension secondary to endocrine disorders: Secondary | ICD-10-CM | POA: Diagnosis not present

## 2022-02-26 DIAGNOSIS — E1159 Type 2 diabetes mellitus with other circulatory complications: Secondary | ICD-10-CM | POA: Diagnosis not present

## 2022-02-26 DIAGNOSIS — I447 Left bundle-branch block, unspecified: Secondary | ICD-10-CM | POA: Diagnosis not present

## 2022-02-26 DIAGNOSIS — R002 Palpitations: Secondary | ICD-10-CM | POA: Diagnosis not present

## 2022-02-26 DIAGNOSIS — E785 Hyperlipidemia, unspecified: Secondary | ICD-10-CM | POA: Diagnosis not present

## 2022-02-26 DIAGNOSIS — E1169 Type 2 diabetes mellitus with other specified complication: Secondary | ICD-10-CM | POA: Diagnosis not present

## 2022-02-26 DIAGNOSIS — R0609 Other forms of dyspnea: Secondary | ICD-10-CM | POA: Diagnosis not present

## 2022-02-26 DIAGNOSIS — I10 Essential (primary) hypertension: Secondary | ICD-10-CM | POA: Diagnosis not present

## 2022-02-26 DIAGNOSIS — Z8249 Family history of ischemic heart disease and other diseases of the circulatory system: Secondary | ICD-10-CM | POA: Diagnosis not present

## 2022-02-28 DIAGNOSIS — D508 Other iron deficiency anemias: Secondary | ICD-10-CM | POA: Diagnosis not present

## 2022-03-03 DIAGNOSIS — E1169 Type 2 diabetes mellitus with other specified complication: Secondary | ICD-10-CM | POA: Diagnosis not present

## 2022-03-03 DIAGNOSIS — D508 Other iron deficiency anemias: Secondary | ICD-10-CM | POA: Diagnosis not present

## 2022-03-03 DIAGNOSIS — E785 Hyperlipidemia, unspecified: Secondary | ICD-10-CM | POA: Diagnosis not present

## 2022-03-03 DIAGNOSIS — E1159 Type 2 diabetes mellitus with other circulatory complications: Secondary | ICD-10-CM | POA: Diagnosis not present

## 2022-03-03 DIAGNOSIS — I152 Hypertension secondary to endocrine disorders: Secondary | ICD-10-CM | POA: Diagnosis not present

## 2022-03-07 ENCOUNTER — Emergency Department (HOSPITAL_COMMUNITY)
Admission: EM | Admit: 2022-03-07 | Discharge: 2022-03-07 | Disposition: A | Discharge status: Home / Self Care | Payer: BC Managed Care – PPO | Attending: Emergency Medicine | Admitting: Emergency Medicine

## 2022-03-07 ENCOUNTER — Emergency Department (HOSPITAL_COMMUNITY): Payer: BC Managed Care – PPO

## 2022-03-07 ENCOUNTER — Encounter (HOSPITAL_COMMUNITY): Payer: Self-pay

## 2022-03-07 DIAGNOSIS — I509 Heart failure, unspecified: Secondary | ICD-10-CM

## 2022-03-07 DIAGNOSIS — Z7984 Long term (current) use of oral hypoglycemic drugs: Secondary | ICD-10-CM | POA: Insufficient documentation

## 2022-03-07 DIAGNOSIS — I959 Hypotension, unspecified: Secondary | ICD-10-CM | POA: Diagnosis not present

## 2022-03-07 DIAGNOSIS — I110 Hypertensive heart disease with heart failure: Secondary | ICD-10-CM | POA: Insufficient documentation

## 2022-03-07 DIAGNOSIS — E119 Type 2 diabetes mellitus without complications: Secondary | ICD-10-CM | POA: Diagnosis not present

## 2022-03-07 DIAGNOSIS — R000 Tachycardia, unspecified: Secondary | ICD-10-CM | POA: Diagnosis not present

## 2022-03-07 DIAGNOSIS — J90 Pleural effusion, not elsewhere classified: Secondary | ICD-10-CM | POA: Diagnosis not present

## 2022-03-07 DIAGNOSIS — Z79899 Other long term (current) drug therapy: Secondary | ICD-10-CM | POA: Diagnosis not present

## 2022-03-07 DIAGNOSIS — E039 Hypothyroidism, unspecified: Secondary | ICD-10-CM | POA: Insufficient documentation

## 2022-03-07 DIAGNOSIS — R0689 Other abnormalities of breathing: Secondary | ICD-10-CM | POA: Diagnosis not present

## 2022-03-07 DIAGNOSIS — Z20822 Contact with and (suspected) exposure to covid-19: Secondary | ICD-10-CM | POA: Insufficient documentation

## 2022-03-07 DIAGNOSIS — R0602 Shortness of breath: Secondary | ICD-10-CM | POA: Diagnosis not present

## 2022-03-07 DIAGNOSIS — R911 Solitary pulmonary nodule: Secondary | ICD-10-CM | POA: Diagnosis not present

## 2022-03-07 LAB — MAGNESIUM: Magnesium: 1.9 mg/dL (ref 1.7–2.4)

## 2022-03-07 LAB — I-STAT BETA HCG BLOOD, ED (MC, WL, AP ONLY): I-stat hCG, quantitative: 20.4 m[IU]/mL — ABNORMAL HIGH (ref <5)

## 2022-03-07 LAB — CBC WITH DIFFERENTIAL/PLATELET
Abs Immature Granulocytes: 0.01 10*3/uL (ref 0.00–0.07)
Basophils Absolute: 0 10*3/uL (ref 0.0–0.1)
Basophils Relative: 0 %
Eosinophils Absolute: 0 10*3/uL (ref 0.0–0.5)
Eosinophils Relative: 0 %
HCT: 41.5 % (ref 36.0–46.0)
Hemoglobin: 13.1 g/dL (ref 12.0–15.0)
Immature Granulocytes: 0 %
Lymphocytes Relative: 18 %
Lymphs Abs: 1.3 10*3/uL (ref 0.7–4.0)
MCH: 22 pg — ABNORMAL LOW (ref 26.0–34.0)
MCHC: 31.6 g/dL (ref 30.0–36.0)
MCV: 69.6 fL — ABNORMAL LOW (ref 80.0–100.0)
Monocytes Absolute: 0.4 10*3/uL (ref 0.1–1.0)
Monocytes Relative: 5 %
Neutro Abs: 5.7 10*3/uL (ref 1.7–7.7)
Neutrophils Relative %: 77 %
Platelets: 316 10*3/uL (ref 150–400)
RBC: 5.96 MIL/uL — ABNORMAL HIGH (ref 3.87–5.11)
RDW: 19.5 % — ABNORMAL HIGH (ref 11.5–15.5)
WBC: 7.5 10*3/uL (ref 4.0–10.5)
nRBC: 0 % (ref 0.0–0.2)

## 2022-03-07 LAB — URINALYSIS, ROUTINE W REFLEX MICROSCOPIC
Bilirubin Urine: NEGATIVE
Glucose, UA: NEGATIVE mg/dL
Hgb urine dipstick: NEGATIVE
Ketones, ur: NEGATIVE mg/dL
Leukocytes,Ua: NEGATIVE
Nitrite: NEGATIVE
Protein, ur: NEGATIVE mg/dL
Specific Gravity, Urine: 1.019 (ref 1.005–1.030)
pH: 7 (ref 5.0–8.0)

## 2022-03-07 LAB — TROPONIN I (HIGH SENSITIVITY)
Troponin I (High Sensitivity): 12 ng/L (ref <18)
Troponin I (High Sensitivity): 13 ng/L (ref <18)

## 2022-03-07 LAB — BASIC METABOLIC PANEL
Anion gap: 11 (ref 5–15)
BUN: 11 mg/dL (ref 6–20)
CO2: 20 mmol/L — ABNORMAL LOW (ref 22–32)
Calcium: 9.2 mg/dL (ref 8.9–10.3)
Chloride: 107 mmol/L (ref 98–111)
Creatinine, Ser: 1.05 mg/dL — ABNORMAL HIGH (ref 0.44–1.00)
GFR, Estimated: >60 mL/min (ref >60)
Glucose, Bld: 88 mg/dL (ref 70–99)
Potassium: 4.2 mmol/L (ref 3.5–5.1)
Sodium: 138 mmol/L (ref 135–145)

## 2022-03-07 LAB — RESP PANEL BY RT-PCR (FLU A&B, COVID) ARPGX2
Influenza A by PCR: NEGATIVE
Influenza B by PCR: NEGATIVE
SARS Coronavirus 2 by RT PCR: NEGATIVE

## 2022-03-07 LAB — TSH: TSH: 2.213 u[IU]/mL (ref 0.350–4.500)

## 2022-03-07 LAB — BRAIN NATRIURETIC PEPTIDE: B Natriuretic Peptide: 290.4 pg/mL — ABNORMAL HIGH (ref 0.0–100.0)

## 2022-03-07 MED ORDER — IOHEXOL 350 MG/ML SOLN
75.0000 mL | Freq: Once | INTRAVENOUS | Status: AC | PRN
  Administered 2022-03-07: 75 mL via INTRAVENOUS

## 2022-03-07 MED ORDER — CARVEDILOL 12.5 MG PO TABS: 12.5000 mg | ORAL_TABLET | Freq: Two times a day (BID) | ORAL | 0 refills | Status: DC

## 2022-03-07 MED ORDER — FUROSEMIDE 20 MG PO TABS: 20.0000 mg | ORAL_TABLET | ORAL | 0 refills | Status: DC

## 2022-03-07 NOTE — ED Provider Notes (Signed)
[facility] EMERGENCY DEPARTMENT Provider Note   CSN: [id number] Arrival date & time: 03/07/22  0840     History  Chief Complaint  Patient presents with   Shortness of Breath    Ana [name] is a 41 y.o. female.  Pt is a 41 yo female with a pmhx significant for htn, pcos, dm2, OSA, hypothyroidism (s/p ablation for Graves disease) and CHF.  Pt was just recently diagnosed with CHF on 11/3.  She had an ECHO which showed an EF of 30%.  Her cardiologist ([facility]) put a life vest on her.  She has not been shocked.  Pt was scheduled for a cardiac CT last week, but her HR was too high.  Her cardiologist changed her from metoprolol to carvedilol, but HR remains elevated.  Pt said she woke up last night (around 0300) feeling very sob.  She had to sit up and could not tolerate her cpap machine.  Pt continued feeling sob, so she called EMS.  Pt has been compliant with her meds.  However, she only took her thyroid med this am.       Home Medications Prior to Admission medications   Medication Sig Start Date End Date Taking? Authorizing Provider  furosemide (LASIX) 20 MG tablet Take 1 tablet (20 mg total) by mouth every other day. 03/07/22  Yes Ana Lefevre, MD  amLODipine (NORVASC) 5 MG tablet TAKE 1 TABLET BY MOUTH EVERY DAY NEEDS APPT FOR REFILLS 02/02/22   Carlus Pavlov, MD  carvedilol (COREG) 12.5 MG tablet Take 1 tablet (12.5 mg total) by mouth 2 (two) times daily with a meal. 03/07/22 04/06/22 Yes Ana Lefevre, MD  glucose blood test strip Use 2 times day as instructed - One Touch Ultra 09/26/13   Carlus Pavlov, MD  ibuprofen (ADVIL) 800 MG tablet TAKE 1 TABLET BY MOUTH EVERY 8 HOURS AS NEEDED 10/01/18   Myrlene Broker, MD  levonorgestrel (MIRENA) 20 MCG/24HR IUD 1 Intra Uterine Device (1 each total) by Intrauterine route once. Placed 07/19/13 - gyn 08/02/13   Newt Lukes, MD  levothyroxine (SYNTHROID) 100 MCG tablet Take 1 tablet (100 mcg  total) by mouth daily before breakfast. 05/27/21   Carlus Pavlov, MD  metFORMIN (GLUCOPHAGE-XR) 500 MG 24 hr tablet TAKE 2 TABLETS BY MOUTH TWICE A DAY 12/30/21   Carlus Pavlov, MD  MOUNJARO 5 MG/0.5ML Pen INJECT 5 MG SUBCUTANEOUSLY WEEKLY 09/20/21   Carlus Pavlov, MD  pravastatin (PRAVACHOL) 20 MG tablet Take 1 tablet daily 09/17/20   Carlus Pavlov, MD  tirzepatide [facility]) 7.5 MG/0.5ML Pen Inject 7.5 mg into the skin once a week. 09/22/21   Carlus Pavlov, MD  topiramate (TOPAMAX) 25 MG tablet Take 2 tablets (50 mg total) by mouth daily. 06/13/18   Myrlene Broker, MD      Allergies    Invokana [canagliflozin]    Review of Systems   Review of Systems  Respiratory:  Positive for shortness of breath.   Cardiovascular:  Positive for palpitations.  All other systems reviewed and are negative.   Physical Exam Updated Vital Signs BP (!) 136/105   Pulse (!) 111   Temp 98.3 F (36.8 C) (Oral)   Resp (!) 22   SpO2 100%  Physical Exam Vitals and nursing note reviewed.  Constitutional:      Appearance: She is well-developed. She is obese.  HENT:     Head: Normocephalic and atraumatic.     Mouth/Throat:     Mouth: Mucous membranes  are moist.     Pharynx: Oropharynx is clear.  Eyes:     Extraocular Movements: Extraocular movements intact.     Pupils: Pupils are equal, round, and reactive to light.  Cardiovascular:     Rate and Rhythm: Regular rhythm. Tachycardia present.  Pulmonary:     Effort: Tachypnea present.     Breath sounds: Normal breath sounds.  Abdominal:     General: Bowel sounds are normal.     Palpations: Abdomen is soft.  Musculoskeletal:        General: Normal range of motion.  Skin:    General: Skin is warm.     Capillary Refill: Capillary refill takes less than 2 seconds.  Neurological:     General: No focal deficit present.     Mental Status: She is alert and oriented to person, place, and time.  Psychiatric:        Mood and Affect: Mood  normal.        Behavior: Behavior normal.     ED Results / Procedures / Treatments   Labs (all labs ordered are listed, but only abnormal results are displayed) Labs Reviewed  BASIC METABOLIC PANEL - Abnormal; Notable for the following components:      Result Value   CO2 20 (*)    Creatinine, Ser 1.05 (*)    All other components within normal limits  CBC WITH DIFFERENTIAL/PLATELET - Abnormal; Notable for the following components:   RBC 5.96 (*)    MCV 69.6 (*)    MCH 22.0 (*)    RDW 19.5 (*)    All other components within normal limits  BRAIN NATRIURETIC PEPTIDE - Abnormal; Notable for the following components:   B Natriuretic Peptide 290.4 (*)    All other components within normal limits  URINALYSIS, ROUTINE W REFLEX MICROSCOPIC - Abnormal; Notable for the following components:   Color, Urine STRAW (*)    All other components within normal limits  I-STAT BETA HCG BLOOD, ED ([facility], [facility], [facility] ONLY) - Abnormal; Notable for the following components:   I-stat hCG, quantitative 20.4 (*)    All other components within normal limits  RESP PANEL BY RT-PCR (FLU A&B, COVID) ARPGX2  MAGNESIUM  TSH  TROPONIN I (HIGH SENSITIVITY)  TROPONIN I (HIGH SENSITIVITY)    EKG EKG Interpretation  Date/Time:  Monday March 07 2022 08:50:14 EST Ventricular Rate:  120 PR Interval:  152 QRS Duration: 139 QT Interval:  359 QTC Calculation: 508 R Axis:   -29 Text Interpretation: Sinus tachycardia Probable left atrial enlargement Left bundle branch block  LBBB is new from prior (pt has hx of lbbb on EKGs done at her cardiologist's office) Confirmed by Ana [name], Tierre Gerard [phone]) on 03/07/2022 9:14:16 AM  Radiology CT Angio Chest PE W and/or Wo Contrast  Result Date: 03/07/2022 CLINICAL DATA:  41 year old female with clinical concern for pulmonary embolism. EXAM: CT ANGIOGRAPHY CHEST WITH CONTRAST TECHNIQUE: Multidetector CT imaging of the chest was performed using the standard protocol during bolus  administration of intravenous contrast. Multiplanar CT image reconstructions and MIPs were obtained to evaluate the vascular anatomy. RADIATION DOSE REDUCTION: This exam was performed according to the departmental dose-optimization program which includes automated exposure control, adjustment of the mA and/or kV according to patient size and/or use of iterative reconstruction technique. CONTRAST:  Seventy mL Omnipaque 350, intravenous COMPARISON:  06/11/2021 FINDINGS: Cardiovascular: Satisfactory opacification of the pulmonary arteries to the segmental level. No evidence of pulmonary embolism. Left ventricular cardiomegaly. Otherwise normal heart size.  No pericardial effusion. Mediastinum/Nodes: No enlarged mediastinal, hilar, or axillary lymph nodes. Thyroid gland, trachea, and esophagus demonstrate no significant findings. Lungs/Pleura: Similar appearance of previously visualized partially calcified right middle lobe subpleural pulmonary nodule which measures up to approximately 4 mm. Trace, right greater than left bilateral pleural effusions. Upper Abdomen: Unchanged simple hepatic cyst in the dome of the right lobe of the liver. The remaining visualized upper abdomen is within normal limits. Musculoskeletal: No chest wall abnormality. No acute or significant osseous findings. Review of the MIP images confirms the above findings. IMPRESSION: Vascular: 1. No evidence of pulmonary embolism. 2. Left ventricular cardiomegaly. Non-Vascular: 1. Trace right greater than left bilateral pleural effusions. 2. Similar appearing 4 mm solid pulmonary nodule in the right middle lobe, partially calcified. No follow-up recommended. This recommendation follows the consensus statement: Guidelines for Management of Incidental Pulmonary Nodules Detected on CT Images: From the Fleischner Society 2017; Radiology 2017; 284:228-243. Marliss Coots, MD [facility] Radiology Electronically  Signed   By: Marliss Coots M.D.   On: 03/07/2022 11:56   DG Chest Port 1 View  Result Date: 03/07/2022 CLINICAL DATA:  Shortness of breath.  CHF exacerbation. EXAM: PORTABLE CHEST 1 VIEW COMPARISON:  None Available. FINDINGS: Heart size and mediastinal contours are unremarkable. There is no pleural effusion or edema identified. No airspace opacities identified. Visualized osseous structures are unremarkable. IMPRESSION: No active disease. Electronically Signed   By: Signa Kell M.D.   On: 03/07/2022 09:25    Procedures Procedures    Medications Ordered in ED Medications  iohexol (OMNIPAQUE) 350 MG/ML injection 75 mL (75 mLs Intravenous Contrast Given 03/07/22 1144)    ED Course/ Medical Decision Making/ A&P                           Medical Decision Making Amount and/or Complexity of Data Reviewed Labs: ordered. Radiology: ordered.  Risk Prescription drug management.   This patient presents to the ED for concern of sob, this involves an extensive number of treatment options, and is a complaint that carries with it a high risk of complications and morbidity.  The differential diagnosis includes chf exac, pna, covid/flu, PE   Co morbidities that complicate the patient evaluation   htn, pcos, dm2, OSA, hypothyroidism (s/p ablation for Graves disease) and CHF   Additional history obtained:  Additional history obtained from epic chart review External records from outside source obtained and reviewed including EMS report   Lab Tests:  I Ordered, and personally interpreted labs.  The pertinent results include:  bmp nl, cbc nl, bnp 290, trop 12 and 13,  tsh nl, mg nl, istat quant 20 (likely neg.  Pt has an IUD), ua neg, covid/flu neg,    Imaging Studies ordered:  I ordered imaging studies including cxr and ct chest  I independently visualized and interpreted imaging which showed  CXR: No active disease.  CT chest: Vascular:    1. No evidence of pulmonary embolism.   2. Left ventricular cardiomegaly.    Non-Vascular:    1. Trace right greater than left bilateral pleural effusions.  2. Similar appearing 4 mm solid pulmonary nodule in the right middle  lobe, partially calcified. No follow-up recommended. This  recommendation follows the consensus statement: Guidelines for  Management of Incidental Pulmonary Nodules Detected on CT Images:  From the Fleischner Society 2017; Radiology 2017; 284:228-243.   I agree with the radiologist interpretation   Cardiac  Monitoring:  The patient was maintained on a cardiac monitor.  I personally viewed and interpreted the cardiac monitored which showed an underlying rhythm of: sinus tachy   Medicines ordered and prescription drug management:   I have reviewed the patients home medicines and have made adjustments as needed   Test Considered:  ct   Critical Interventions:  ct   Consultations Obtained:  I requested consultation with the cardiologist (Dr. Fransisco Beau on call for Dr. Erma Heritage),  and discussed lab and imaging findings as well as pertinent plan -he recommends increasing the coreg to 12.5 mg bid and starting lasix to 20 mg qod.   Problem List / ED Course:  Sinus tachycardia:  Cardiac work up neg.  CT neg for PE.  We will increase coreg to 12.5 mg bid and start lasix 20 mg qod.  I am going to hold her lisinopril as I am increasing her coreg.    Reevaluation:  After the interventions noted above, I reevaluated the patient and found that they have :improved   Social Determinants of Health:  Lives at home   Dispostion:  After consideration of the diagnostic results and the patients response to treatment, I feel that the patent would benefit from discharge with outpatient f/u.          Final Clinical Impression(s) / ED Diagnoses Final diagnoses:  SOB (shortness of breath)  Sinus tachycardia    Rx / DC Orders ED Discharge Orders          Ordered    carvedilol (COREG) 12.5 MG  tablet  2 times daily with meals        03/07/22 1412    furosemide (LASIX) 20 MG tablet  Every other day        03/07/22 1413              Ana Lefevre, MD 03/07/22 1415

## 2022-03-07 NOTE — Discharge Instructions (Addendum)
Stop your Lisinopril.  Increase your Coreg from 6.25 mg to 12. 5 mg twice a day.  Start Lasix 20 mg every other day.

## 2022-03-07 NOTE — ED Triage Notes (Signed)
Pt BIB EMS for shortness of breath and CHF exacerbation, newly diagnosed on 11/3. External defibrillator present. Aox4.

## 2022-03-15 DIAGNOSIS — Z01818 Encounter for other preprocedural examination: Secondary | ICD-10-CM | POA: Diagnosis not present

## 2022-03-18 DIAGNOSIS — G4733 Obstructive sleep apnea (adult) (pediatric): Secondary | ICD-10-CM | POA: Diagnosis not present

## 2022-03-18 DIAGNOSIS — E669 Obesity, unspecified: Secondary | ICD-10-CM | POA: Diagnosis not present

## 2022-03-18 DIAGNOSIS — Z888 Allergy status to other drugs, medicaments and biological substances status: Secondary | ICD-10-CM | POA: Diagnosis not present

## 2022-03-18 DIAGNOSIS — E890 Postprocedural hypothyroidism: Secondary | ICD-10-CM | POA: Diagnosis not present

## 2022-03-18 DIAGNOSIS — K769 Liver disease, unspecified: Secondary | ICD-10-CM | POA: Diagnosis not present

## 2022-03-18 DIAGNOSIS — I2510 Atherosclerotic heart disease of native coronary artery without angina pectoris: Secondary | ICD-10-CM | POA: Diagnosis not present

## 2022-03-18 DIAGNOSIS — Z7984 Long term (current) use of oral hypoglycemic drugs: Secondary | ICD-10-CM | POA: Diagnosis not present

## 2022-03-18 DIAGNOSIS — I10 Essential (primary) hypertension: Secondary | ICD-10-CM | POA: Diagnosis not present

## 2022-03-18 DIAGNOSIS — Z79899 Other long term (current) drug therapy: Secondary | ICD-10-CM | POA: Diagnosis not present

## 2022-03-18 DIAGNOSIS — D569 Thalassemia, unspecified: Secondary | ICD-10-CM | POA: Diagnosis not present

## 2022-03-18 DIAGNOSIS — Z79890 Hormone replacement therapy (postmenopausal): Secondary | ICD-10-CM | POA: Diagnosis not present

## 2022-03-18 DIAGNOSIS — I447 Left bundle-branch block, unspecified: Secondary | ICD-10-CM | POA: Diagnosis not present

## 2022-03-18 DIAGNOSIS — D509 Iron deficiency anemia, unspecified: Secondary | ICD-10-CM | POA: Diagnosis not present

## 2022-03-18 DIAGNOSIS — Z6838 Body mass index (BMI) 38.0-38.9, adult: Secondary | ICD-10-CM | POA: Diagnosis not present

## 2022-03-18 DIAGNOSIS — R000 Tachycardia, unspecified: Secondary | ICD-10-CM | POA: Diagnosis not present

## 2022-03-18 DIAGNOSIS — E119 Type 2 diabetes mellitus without complications: Secondary | ICD-10-CM | POA: Diagnosis not present

## 2022-03-22 NOTE — Progress Notes (Signed)
Patient ID: MAHAYLA ALBALADEJO, female   DOB: 1981-04-03, 41 y.o.   MRN: [id number]  HPI: SURIE SMID is a 41 y.o.-year-old female, returning for f/u DM2, dx summer 2014, non-insulin-dependent, controlled, without long-term complications and also Graves ds. Last visit 6 months ago.   Interim history: No blurry vision. Mild nausea.  He also mentions shortness of breath. She was found to have an abnormal EKG (left bundle branch block, left atrial enlargement) during cardiology eval. in preparartion for weight loss surgery. 2D Echo showed a decrease in EF (30%). Heart catheterization did not show CAD.  CT angio did not show a PE but showed left ventricular enlargement.  Cardiac MRI pending. Now on Entresto, Carvedilol >> will change to Metoprolol (to increase cardiac selectivity).  Reviewed HbA1c levels: 12/01/2021: HbA1c 5.6% 08/26/2021: HbA1c 5.9% Lab Results  Component Value Date   HGBA1C 6.4 (A) 01/27/2021   HGBA1C 6.1 (A) 09/17/2020   HGBA1C 5.7 (A) 03/19/2020  Previously 10.5% (10/2012).  Previously 6.5%.   Patient is on a regimen of: - Metformin ER 1000 >> 500 mg twice a day >> 1000 mg in am - Mounjaro 7.5 >> 5 mg weekly (due to availability) >> 7.5 mg weekly Stopped Bydureon due to reaction to the injection sites. She was previously on Ozempic before switching to [facility]. We had to stop Invokana 100 mg b/c repeated yeast inf. (09/26/2013) She was on JanuMet when she was prediabetic >> tolerated it well.   She has a One Touch Ultra meter.  She is not checking sugars inconsistently: - am:  90s >> 90s >> 90s >> 90s >> 91-96 >> 91-100 >> 86-91 - 2h after b'fast: 98-118 >> n/c - lunch:  90-120 >> n/c - 2h after lunch:  up to 130 >> <130 >> n/c - dinner  90s-130s >> n/c >> 121-130 >>  <124 >> n/c - 2h after dinner: 148 >> up to 130 >> <130 >> n/c Highest: 150s >> 148 (Christmas) >> 130 >> 124 >> n/c  -No CKD, last BUN/creatinine:  Lab Results  Component Value Date    BUN 11 03/07/2022   CREATININE 1.05 (H) 03/07/2022   Normal ACR: Lab Results  Component Value Date   MICRALBCREAT 2.3 03/19/2020   MICRALBCREAT 2.0 10/26/2018   MICRALBCREAT 3.4 12/11/2017   MICRALBCREAT 10.3 08/03/2016   MICRALBCREAT 4.0 07/10/2015   MICRALBCREAT 5.7 02/05/2014   MICRALBCREAT 9.4 07/11/2013   MICRALBCREAT 36.3 (H) 03/07/2013   She had proteinuria after pregnancy 2010.   -+ HL. Last lipids: 08/26/2021: 222/134/49/149 03/23/2021: 177/134/37/116 Lab Results  Component Value Date   CHOL 184 03/19/2020   HDL 41.30 03/19/2020   LDLCALC 117 (H) 03/19/2020   LDLDIRECT 97.0 06/05/2018   TRIG 126.0 03/19/2020   CHOLHDL 4 03/19/2020  On pravastatin 20 >> lipitor 20 mg daily.  - Last eye exam 05/04/2021: No DR reportedly.  - no numbness and tingling in her feet.  Last foot exam 05/27/2021.  Reviewed her EKG report from 03/07/2022:   She started Entresto and carvedilol.  H/o Graves ds.:  Reviewed her treatment history: 02/2014: started MMI 10 mg twice a day MMI tapered down 09/2015: stopped MMI 07/2016: restarted MMI 5 mg daily 06/2017: MMI decrease to 2.5 mg daily  11/2017: MMI stopped again 04/2019: MMI restarted at 5 mg twice a day 06/2019: MMI decrease to 5 mg daily 08/2019: MMI decrease to 2.5 mg daily  Thyroid uptake (04/29/2020): Increased, at 44.5% RAI treatment (06/03/2020) Levothyroxine started 08/2020. Metoprolol stopped  01/2021 >> will restart.  She is on levothyroxine 100 mcg daily (dose increased 01/2021, decreased 03/2021) - in am - fasting - at least 1h from b'fast - no calcium - no iron - + multivitamins after lunch - no PPIs - not on Biotin - on B12 at night  Reviewed her TFTs: Lab Results  Component Value Date   TSH 2.213 03/07/2022   TSH 1.85 05/27/2021   TSH 13.71 (H) 01/27/2021   TSH 43.95 (H) 11/12/2020   TSH 6.43 (H) 08/18/2020   FREET4 1.00 05/27/2021   FREET4 0.65 01/27/2021   FREET4 0.40 (L) 11/12/2020   FREET4  0.37 (L) 08/18/2020   FREET4 1.42 07/10/2020   Lab Results  Component Value Date   T3FREE 2.5 08/18/2020   T3FREE 4.3 (H) 07/10/2020   T3FREE 3.2 03/19/2020   T3FREE 3.0 09/09/2019   T3FREE 3.0 07/15/2019   T3FREE 6.0 (H) 04/24/2019   T3FREE 3.2 10/26/2018   T3FREE 3.2 06/05/2018   T3FREE 3.5 01/30/2018   T3FREE 4.1 12/11/2017  08/26/2021: TSH 2.79 03/23/2021: TSH 0.057  At last check, her Graves' antibodies were again detectable: Lab Results  Component Value Date   TSI 221 (H) 03/19/2020   TSI <89 01/30/2018   TSI 210 (H) 04/06/2017   TSI 152 (H) 02/11/2016   TSI 188 (H) 12/18/2013   She also has a history of PCOS, HTN, vitamin D deficiency: 08/26/2021 : vitamin D 23 Lab Results  Component Value Date   VD25OH 27.42 (L) 05/27/2021   VD25OH 19.29 (L) 08/03/2016   VD25OH 15.59 (L) 05/21/2014  She was started on Ergocalciferol 50,000 units weekly -per PCP  Her B12 was low normal: 08/26/2021: vitamin B12 257  ROS: + See HPI  I reviewed pt's medications, allergies, PMH, social hx, family hx, and changes were documented in the history of present illness. Otherwise, unchanged from my initial visit note.  Past Medical History:  Diagnosis Date   CHF (congestive heart failure) ([place]) 02/18/2022   DM2 (diabetes mellitus, type 2) ([place]) 01/2013 dx   Hypertension    Other and unspecified hyperlipidemia    PCOS (polycystic ovarian syndrome)    Past Surgical History:  Procedure Laterality Date   CESAREAN SECTION  2010   Social History   Socioeconomic History   Marital status: Married    Spouse name: Not on file   Number of children: Not on file   Years of education: Not on file   Highest education level: Not on file  Occupational History   Not on file  Tobacco Use   Smoking status: Never   Smokeless tobacco: Never  Substance and Sexual Activity   Alcohol use: No   Drug use: No   Sexual activity: Yes    Partners: Male  Other Topics Concern   Not on file   Social History Narrative   Regular exercise: no   Caffeine use: 2 soda's daily   Lives with spouse and son   Works at [employer] as Psychologist, clinical of Radio broadcast assistant Strain: Not on file  Food Insecurity: Not on file  Transportation Needs: Not on file  Physical Activity: Not on file  Stress: Not on file  Social Connections: Not on file  Intimate Partner Violence: Not on file   Current Outpatient Medications on File Prior to Visit  Medication Sig Dispense Refill   amLODipine (NORVASC) 5 MG tablet TAKE 1 TABLET BY MOUTH EVERY DAY NEEDS APPT FOR REFILLS 90 tablet 0  carvedilol (COREG) 12.5 MG tablet Take 1 tablet (12.5 mg total) by mouth 2 (two) times daily with a meal. 60 tablet 0   furosemide (LASIX) 20 MG tablet Take 1 tablet (20 mg total) by mouth every other day. 15 tablet 0   glucose blood test strip Use 2 times day as instructed - One Touch Ultra 100 each 11   ibuprofen (ADVIL) 800 MG tablet TAKE 1 TABLET BY MOUTH EVERY 8 HOURS AS NEEDED 90 tablet 1   levonorgestrel (MIRENA) 20 MCG/24HR IUD 1 Intra Uterine Device (1 each total) by Intrauterine route once. Placed 07/19/13 - gyn 1 each 0   levothyroxine (SYNTHROID) 100 MCG tablet Take 1 tablet (100 mcg total) by mouth daily before breakfast. 90 tablet 3   metFORMIN (GLUCOPHAGE-XR) 500 MG 24 hr tablet TAKE 2 TABLETS BY MOUTH TWICE A DAY 360 tablet 1   MOUNJARO 5 MG/0.5ML Pen INJECT 5 MG SUBCUTANEOUSLY WEEKLY 2 mL 1   pravastatin (PRAVACHOL) 20 MG tablet Take 1 tablet daily 90 tablet 3   tirzepatide (MOUNJARO) 7.5 MG/0.5ML Pen Inject 7.5 mg into the skin once a week. 2 mL 11   topiramate (TOPAMAX) 25 MG tablet Take 2 tablets (50 mg total) by mouth daily. 180 tablet 3   No current facility-administered medications on file prior to visit.   Allergies  Allergen Reactions   Invokana [Canagliflozin]     Recurrent yeast infections   Family History  Problem Relation Age of Onset   Diabetes Mother        Type II    Thyroid disease Mother    Hypertension Mother    Hyperlipidemia Mother    Diabetes Father        TYPE II   Hypertension Father    Hyperlipidemia Father    Heart disease Father        CAD with stent   Hyperlipidemia Maternal Grandmother    Hypertension Maternal Grandmother    Obesity Maternal Grandmother    Diabetes Maternal Grandmother    Cancer Paternal Grandmother 21       colon cancer   Diabetes Paternal Grandmother    PE: BP 120/84 (BP Location: Left Arm, Patient Position: Sitting, Cuff Size: Normal)   Pulse (!) 104   Ht 5\' 2"  (1.575 m)   Wt 209 lb 3.2 oz (94.9 kg)   SpO2 96%   BMI 38.26 kg/m    Wt Readings from Last 3 Encounters:  03/23/22 209 lb 3.2 oz (94.9 kg)  09/22/21 219 lb (99.3 kg)  05/27/21 209 lb (94.8 kg)   Constitutional: overweight, in NAD Eyes: EOMI, no exophthalmos ENT: no masses palpated in neck, no cervical lymphadenopathy Cardiovascular: RRR, No MRG Respiratory: CTA B Musculoskeletal: no deformities Skin: o rashes Neurological: no tremor with outstretched hands  ASSESSMENT: 1. DM2, non-insulin-dependent, controlled, with complications - CHF  2. Graves ds - 03/12/2014: Thyroid uptake and scan: Consistent with Graves' disease: The 24 hr uptake by the thyroid gland is 65%. Normal 24 hr uptake is 10-30%. On thyroid imaging, the thyroid activity appears diffusely increased. No focal hot or cold nodules are identified.  3.  Hyperlipidemia  4. Obesity class II PLAN:  1. Patient with well-controlled type 2 diabetes, on metformin and GLP-1/GIP receptor agonist.  She did not respond well to Ozempic in the past that she was still gaining weight but doing well on Mounjaro.  At last visit she was on 5 mg weekly decreased from 7.5 mg dose due to lack of availability.  Latest HbA1c was reviewed from 12/01/2021 and this was excellent, at 5.6%. -At today's visit, sugars are excellent when she checks, but not checking consistently.  HbA1c is 5.5%,  excellent.  I am tempted to continue her regimen, however, in the light of her new diagnosis of CHF, I suggested to change from metformin to [place] low-dose.  I did advise her to check with cardiology first.  She had a positive pregnancy test on 03/07/2022, but the repeat on 03/18/2022 was negative.  She has an IUD so it is likely that the latest result was correct.  I did advise her to check a pregnancy test at home, just in case and only to start [place] if it is negative. -I advised her to: Patient Instructions  Please stop: - Metformin  Start: - Farxiga 5 mg before b'fast  Continue: - Mounjaro 7.5 mg weekly  Please return in 4-6 months with your sugar log.   - advised to restart check sugars at different times of the day - 1x a day, rotating check times - advised for yearly eye exams >> she is UTD - return to clinic in 4-6 months  2.  Post ablative hypothyroidism -Patient with history of recurrent Graves' disease, with minimal response to methimazole.  A thyroid uptake was elevated, at 44.5% in 04/2020.  She had RAI treatment in 05/2020.  She developed post ablative hypothyroidism. - latest thyroid labs reviewed with pt. >> normal: Lab Results  Component Value Date   TSH 2.213 03/07/2022  - she continues on LT4 100 mcg daily - pt feels good on this dose. - we discussed about taking the thyroid hormone every day, with water, >30 minutes before breakfast, separated by >4 hours from acid reflux medications, calcium, iron, multivitamins. Pt. is taking it correctly.  3.  Hyperlipidemia - Reviewed latest lipid panel from 03/2021: LDL above goal, HDL slightly low -and had another lipid panel with PCP 6 months ago -LDL was higher, while HDL was normal. - Continues atorvastatin 20 mg daily without side effects.  She could not tolerate 40 mg daily in the past due to joint pains.  4.  Obesity class II -Previously on Belviq (but no significant weight loss with this), however, this is now  taken off the market -She was doing well on Qsymia but this was not approved by her insurance -She developed injection site reaction to Bydureon in the past -She initially lost a significant amount of weight (approximately 18 pounds since starting Ozempic) but then gained them back -We switched to [facility] >> she lost 12 pounds -Before last visit, she gained 10 pounds.  We discussed that whenever available at the pharmacy, to increase the dose of Mounjaro to 7.5 mg weekly.  She is currently on this dose.  She lost 10 pounds since last visit.  [name], MD PhD [facility]

## 2022-03-23 ENCOUNTER — Ambulatory Visit (INDEPENDENT_AMBULATORY_CARE_PROVIDER_SITE_OTHER): Payer: BC Managed Care – PPO | Admitting: Internal Medicine

## 2022-03-23 ENCOUNTER — Encounter: Payer: Self-pay | Admitting: Internal Medicine

## 2022-03-23 VITALS — BP 120/84 | HR 104 | Ht 62.0 in | Wt 209.2 lb

## 2022-03-23 DIAGNOSIS — E1159 Type 2 diabetes mellitus with other circulatory complications: Secondary | ICD-10-CM | POA: Diagnosis not present

## 2022-03-23 DIAGNOSIS — E785 Hyperlipidemia, unspecified: Secondary | ICD-10-CM

## 2022-03-23 DIAGNOSIS — G4733 Obstructive sleep apnea (adult) (pediatric): Secondary | ICD-10-CM | POA: Diagnosis not present

## 2022-03-23 DIAGNOSIS — E890 Postprocedural hypothyroidism: Secondary | ICD-10-CM

## 2022-03-23 DIAGNOSIS — E1169 Type 2 diabetes mellitus with other specified complication: Secondary | ICD-10-CM

## 2022-03-23 LAB — POCT GLYCOSYLATED HEMOGLOBIN (HGB A1C): Hemoglobin A1C: 5.5 % (ref 4.0–5.6)

## 2022-03-23 MED ORDER — DAPAGLIFLOZIN PROPANEDIOL 5 MG PO TABS: 5.0000 mg | ORAL_TABLET | Freq: Every day | ORAL | 3 refills | Status: DC

## 2022-03-23 NOTE — Patient Instructions (Signed)
Please stop: - Metformin  Start: - Farxiga 5 mg before b'fast  Continue: - Mounjaro 7.5 mg weekly  Please return in 4-6 months with your sugar log.

## 2022-03-24 DIAGNOSIS — I420 Dilated cardiomyopathy: Secondary | ICD-10-CM | POA: Diagnosis not present

## 2022-04-22 ENCOUNTER — Other Ambulatory Visit: Payer: Self-pay | Admitting: Internal Medicine

## 2022-04-22 DIAGNOSIS — E119 Type 2 diabetes mellitus without complications: Secondary | ICD-10-CM

## 2022-07-05 ENCOUNTER — Other Ambulatory Visit: Payer: Self-pay | Admitting: Internal Medicine

## 2022-07-05 DIAGNOSIS — E119 Type 2 diabetes mellitus without complications: Secondary | ICD-10-CM

## 2022-09-15 ENCOUNTER — Other Ambulatory Visit (HOSPITAL_COMMUNITY): Payer: Self-pay

## 2022-10-11 NOTE — Progress Notes (Unsigned)
Patient ID: Ana [name], female   DOB: Jan 08, 1981, 42 y.o.   MRN: [id number]  HPI: Ana [name] is a 42 y.o.-year-old female, returning for f/u DM2, dx summer 2014, non-insulin-dependent, controlled, without long-term complications and also Graves ds. Last visit 6 months ago.   Interim history: No increased urination, blurry vision, nausea. Since last visit, she had problems with yeast infections.  She was on terconazole as needed. On Coreg, Cardizem.  Reviewed HbA1c levels: Lab Results  Component Value Date   HGBA1C 5.5 03/23/2022   HGBA1C 6.4 (A) 01/27/2021   HGBA1C 6.1 (A) 09/17/2020  12/01/2021: HbA1c 5.6% 08/26/2021: HbA1c 5.9% 10/2012: HbA1c 10.5% Previously 6.5%.   Patient is on a regimen of: - Farxiga 5 mg daily before breakfast-started 03/2022 - Mounjaro 7.5 >> 5 mg weekly (due to availability) >> 7.5 mg weekly Stop metformin 03/2022. Stopped Bydureon due to reaction to the injection sites. She was previously on Ozempic before switching to [facility]. We had to stop Invokana 100 mg b/c repeated yeast inf. (09/26/2013) She was on JanuMet when she was prediabetic >> tolerated it well.   She has a One Touch Ultra meter.  She is not checking sugars inconsistently: - am:  90s >> 90s >> 90s >> 90s >> 91-96 >> 91-100 >> 86-91 >> 91-95 - 2h after b'fast: 98-118 >> n/c - lunch:  90-120 >> n/c - 2h after lunch:  up to 130 >> <130 >> n/c >> up to 131 - dinner  90s-130s >> n/c >> 121-130 >>  <124 >> n/c - 2h after dinner: 148 >> up to 130 >> <130 >> n/c Highest: 150s >> 148 (Christmas) >> 130 >> 124 >> n/c >> 131  -No CKD, last BUN/creatinine:  Component Ref Range & Units 09/21/2022  Glucose 70 - 99 mg/dL 95  BUN 6 - 24 mg/dL 15  Creatinine 3.47 - 4.25 mg/dL 9.56 High   eGFR >38 VF/IEP/3.29 60  BUN/Creatinine Ratio 9 - 23 13  Sodium 134 - 144 mmol/L 139  Potassium 3.5 - 5.2 mmol/L 4.4  Chloride 96 - 106 mmol/L 104  CO2 20 - 29 mmol/L 20  CALCIUM 8.7  - 10.2 mg/dL 9.6  Resulting Agency [facility]   Lab Results  Component Value Date   BUN 11 03/07/2022   CREATININE 1.05 (H) 03/07/2022   Normal ACR: Lab Results  Component Value Date   MICRALBCREAT 2.3 03/19/2020   MICRALBCREAT 2.0 10/26/2018   MICRALBCREAT 3.4 12/11/2017   MICRALBCREAT 10.3 08/03/2016   MICRALBCREAT 4.0 07/10/2015   MICRALBCREAT 5.7 02/05/2014   MICRALBCREAT 9.4 07/11/2013   MICRALBCREAT 36.3 (H) 03/07/2013  On Entresto. She had proteinuria after pregnancy 2010.   -+ HL. Last lipids: Ref Range & Units 12/02/2022  Cholesterol, Total 100 - 199 mg/dL 518 High   Triglycerides 0 - 149 mg/dL 841  HDL >66 mg/dL 41  VLDL Cholesterol Cal 5 - 40 mg/dL 24  LDL 0 - 99 mg/dL 063 High   LDL/HDL Ratio 0.0 - 3.2 ratio 3.3 High    08/26/2021: 222/134/49/149 03/23/2021: 177/134/37/116 Lab Results  Component Value Date   CHOL 184 03/19/2020   HDL 41.30 03/19/2020   LDLCALC 117 (H) 03/19/2020   LDLDIRECT 97.0 06/05/2018   TRIG 126.0 03/19/2020   CHOLHDL 4 03/19/2020  On pravastatin 20 >> lipitor 20 mg daily.  - Last eye exam 04/2022: No DR reportedly.  - no numbness and tingling in her feet.  Last foot exam 05/27/2021.  In 2023, she was  found to have an abnormal EKG (left bundle branch block, left atrial enlargement) during cardiology eval. in preparartion for weight loss surgery. 2D Echo showed a decrease in EF (30%). Heart catheterization did not show CAD.  CT angio did not show a PE but showed left ventricular enlargement.    She started Entresto and carvedilol >> changed to metoprolol.  H/o Graves ds.:  Reviewed her treatment history: 02/2014: started MMI 10 mg twice a day MMI tapered down 09/2015: stopped MMI 07/2016: restarted MMI 5 mg daily 06/2017: MMI decrease to 2.5 mg daily  11/2017: MMI stopped again 04/2019: MMI restarted at 5 mg twice a day 06/2019: MMI decrease to 5 mg daily 08/2019: MMI decrease to 2.5 mg daily Thyroid uptake  (04/29/2020): Increased, at 44.5% RAI treatment (06/03/2020) Levothyroxine started 08/2020.  She is on levothyroxine 100 mcg daily (dose increased 01/2021, decreased 03/2021): - in am - fasting - at least 1h from b'fast - no calcium - no iron - + multivitamins after lunch - no PPIs - not on Biotin - on B12 at night  Reviewed her TFTs: Lab Results  Component Value Date   TSH 2.213 03/07/2022   TSH 1.85 05/27/2021   TSH 13.71 (H) 01/27/2021   TSH 43.95 (H) 11/12/2020   TSH 6.43 (H) 08/18/2020   FREET4 1.00 05/27/2021   FREET4 0.65 01/27/2021   FREET4 0.40 (L) 11/12/2020   FREET4 0.37 (L) 08/18/2020   FREET4 1.42 07/10/2020   Lab Results  Component Value Date   T3FREE 2.5 08/18/2020   T3FREE 4.3 (H) 07/10/2020   T3FREE 3.2 03/19/2020   T3FREE 3.0 09/09/2019   T3FREE 3.0 07/15/2019   T3FREE 6.0 (H) 04/24/2019   T3FREE 3.2 10/26/2018   T3FREE 3.2 06/05/2018   T3FREE 3.5 01/30/2018   T3FREE 4.1 12/11/2017  08/26/2021: TSH 2.79 03/23/2021: TSH 0.057  At last check, her Graves' antibodies were again detectable: Lab Results  Component Value Date   TSI 221 (H) 03/19/2020   TSI <89 01/30/2018   TSI 210 (H) 04/06/2017   TSI 152 (H) 02/11/2016   TSI 188 (H) 12/18/2013   She also has a history of PCOS, HTN, vitamin D deficiency: 08/26/2021 : vitamin D 23 Lab Results  Component Value Date   VD25OH 27.42 (L) 05/27/2021   VD25OH 19.29 (L) 08/03/2016   VD25OH 15.59 (L) 05/21/2014  She was started on Ergocalciferol 50,000 units weekly -per PCP  Her B12 was low normal: 08/26/2021: vitamin B12 257  ROS: + See HPI  I reviewed pt's medications, allergies, PMH, social hx, family hx, and changes were documented in the history of present illness. Otherwise, unchanged from my initial visit note.  Past Medical History:  Diagnosis Date   CHF (congestive heart failure) (HCC) 02/18/2022   DM2 (diabetes mellitus, type 2) (HCC) 01/2013 dx   Hypertension    Other and  unspecified hyperlipidemia    PCOS (polycystic ovarian syndrome)    Past Surgical History:  Procedure Laterality Date   CESAREAN SECTION  2010   Social History   Socioeconomic History   Marital status: Married    Spouse name: Not on file   Number of children: Not on file   Years of education: Not on file   Highest education level: Not on file  Occupational History   Not on file  Tobacco Use   Smoking status: Never   Smokeless tobacco: Never  Substance and Sexual Activity   Alcohol use: No   Drug use: No  Sexual activity: Yes    Partners: Male  Other Topics Concern   Not on file  Social History Narrative   Regular exercise: no   Caffeine use: 2 soda's daily   Lives with spouse and son   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     atorvastatin (LIPITOR) 20 MG tablet Take 20 mg by mouth daily.     dapagliflozin propanediol (FARXIGA) 5 MG TABS tablet Take 1 tablet (5 mg total) by mouth daily before breakfast. 90 tablet 3   glucose blood test strip Use 2 times day as instructed - One Touch Ultra 100 each 11   ibuprofen (ADVIL) 800 MG tablet TAKE 1 TABLET BY MOUTH EVERY 8 HOURS AS NEEDED 90 tablet 1   levonorgestrel (MIRENA) 20 MCG/24HR IUD 1 Intra Uterine Device (1 each total) by Intrauterine route once. Placed 07/19/13 - gyn 1 each 0   levothyroxine (SYNTHROID) 100 MCG tablet Take 1 tablet (100 mcg total) by mouth daily before breakfast. 90 tablet 3   metFORMIN (GLUCOPHAGE-XR) 500 MG 24 hr tablet TAKE 2 TABLETS BY MOUTH TWICE A DAY 360 tablet 1   metoprolol succinate (TOPROL-XL)  100 MG 24 hr tablet Take by mouth.     pravastatin (PRAVACHOL) 20 MG tablet Take 1 tablet daily 90 tablet 3   sacubitril-valsartan (ENTRESTO) 24-26 MG Take by mouth.     tirzepatide (MOUNJARO) 7.5 MG/0.5ML Pen Inject 7.5 mg into the skin once a week. 2 mL 11   No current facility-administered medications on file prior to visit.   Allergies  Allergen Reactions   Invokana [Canagliflozin]     Recurrent yeast infections   Family History  Problem Relation Age of Onset   Diabetes Mother        Type II   Thyroid disease Mother    Hypertension Mother    Hyperlipidemia Mother    Diabetes Father        TYPE II   Hypertension Father    Hyperlipidemia Father    Heart disease Father        CAD with stent   Hyperlipidemia Maternal Grandmother    Hypertension Maternal Grandmother    Obesity Maternal Grandmother    Diabetes Maternal Grandmother    Cancer Paternal Grandmother 6       colon cancer   Diabetes Paternal Grandmother    PE: BP 120/74   Pulse 83   Ht 5\' 2"  (1.575 m)   Wt 222 lb (100.7 kg)   SpO2 99%   BMI 40.60 kg/m    Wt Readings from Last 3 Encounters:  10/12/22 222 lb (100.7 kg)  03/23/22 209 lb 3.2 oz (94.9 kg)  09/22/21 219 lb (99.3 kg)   Constitutional: overweight, in NAD Eyes: EOMI, no exophthalmos ENT: no masses palpated in neck, no cervical lymphadenopathy Cardiovascular: RRR, No MRG Respiratory: CTA B Musculoskeletal: no deformities Skin: o rashes Neurological: no tremor with outstretched hands Diabetic Foot Exam - Simple   Simple Foot Form Diabetic Foot exam was performed with the following findings: Yes 10/12/2022  8:19 AM  Visual Inspection No deformities, no ulcerations, no other skin breakdown bilaterally: Yes Sensation Testing Intact to touch and monofilament testing bilaterally: Yes Pulse Check Posterior Tibialis and Dorsalis pulse intact bilaterally: Yes Comments    ASSESSMENT: 1. DM2, non-insulin-dependent, controlled, with  complications - CHF  2. Graves ds - 03/12/2014: Thyroid uptake and scan: Consistent with Graves' disease: The 24 hr uptake by the thyroid gland is 65%. Normal 24 hr uptake is 10-30%. On thyroid imaging, the thyroid activity appears diffusely increased. No focal hot or cold nodules are identified.  3.  Hyperlipidemia  4. Obesity class II PLAN:  1. Patient with well-controlled type 2 diabetes, on SGLT2 inhibitor and GLP-1/GIP receptor agonist.  At last visit, we stopped metformin and started [place] due to her new diagnosis of CHF.  We continued Mounjaro.  Of note, she did not respond well to Ozempic in the past and she was still gaining weight. -At last visit HbA1c was 5.5%, excellent.  Sugars were at goal when she was checking but she was not taking consistently. -At today's visit, sugars are well-controlled.  The only problem is yeast infections, which reoccurred after starting Farxiga.  OB/GYN recommended terconazole which she was taking intermittently.  I recommended to try to take 1 full applicator every night for 1 week, and then half an applicator at bedtime once weekly to maintain effect.  She will try this.  For now, we will try to continue [place].  We discussed about [place] Pepper but this has approximately the same risk of yeast infections as Marcelline Deist and it is unclear whether this is covered by her insurance. -I advised her to: Patient Instructions  Please continue: - Farxiga 5 mg before b'fast - Mounjaro 7.5 mg weekly  Please start Terconazole Cream 0.4%: - insert 1 applicatorful vaginally at bedtime every night for 1 week, then - insert 1/2 applicatorful at bedtime once weekly indefinitely to maintain effect  Please return in 4-6 months with your sugar log.   - we checked her HbA1c: 5.9% (higher) - advised to check sugars at different times of the day - 1x a day, rotating check times - advised for yearly eye exams >> she is UTD - return to clinic in 4-6 months  2.  Post  ablative hypothyroidism -Patient with history of recurrent Graves' disease, with minimal response to methimazole.  A thyroid uptake was elevated, at 44.5% in 04/2020.  She had RAI treatment in 05/2020.  She is followed post ablative hypothyroidism - latest thyroid labs reviewed with pt. >> normal: Lab Results  Component Value Date   TSH 2.213 03/07/2022  - she continues on LT4 100 mcg daily - pt feels good on this dose. - we discussed about taking the thyroid hormone every day, with water, >30 minutes before breakfast, separated by >4 hours from acid reflux medications, calcium, iron, multivitamins. Pt. is taking it correctly. - will check thyroid tests at next visit  3.  Hyperlipidemia -Latest lipid panel from 11/2021 and the LDL is still above target, although lower -She is on atorvastatin 20 mg daily as she could not tolerate the 40 mg daily due to joint pains.  4.  Obesity class II -Previously on Belviq (but no significant weight loss with this), however, this is now taken off the market -She was doing well on Qsymia but this was not approved by her insurance -She developed injection site reaction to Bydureon in the past -She initially lost a significant amount of weight (approximately 18 pounds  since starting Ozempic) but then gained them back -She lost 10 pounds before last visit on Mounjaro 7.5 mg weekly, but weight increase since last visit.  We discussed that if the weight does not improve before next visit, we will need an increase in the [facility] dose.  Carlus Pavlov, MD PhD [facility] Endocrinology

## 2022-10-12 ENCOUNTER — Ambulatory Visit: Payer: No Typology Code available for payment source | Admitting: Internal Medicine

## 2022-10-12 ENCOUNTER — Encounter: Payer: Self-pay | Admitting: Internal Medicine

## 2022-10-12 VITALS — BP 120/74 | HR 83 | Ht 62.0 in | Wt 222.0 lb

## 2022-10-12 DIAGNOSIS — E669 Obesity, unspecified: Secondary | ICD-10-CM | POA: Diagnosis not present

## 2022-10-12 DIAGNOSIS — E1159 Type 2 diabetes mellitus with other circulatory complications: Secondary | ICD-10-CM | POA: Diagnosis not present

## 2022-10-12 DIAGNOSIS — Z7985 Long-term (current) use of injectable non-insulin antidiabetic drugs: Secondary | ICD-10-CM

## 2022-10-12 DIAGNOSIS — Z7984 Long term (current) use of oral hypoglycemic drugs: Secondary | ICD-10-CM

## 2022-10-12 DIAGNOSIS — E1169 Type 2 diabetes mellitus with other specified complication: Secondary | ICD-10-CM | POA: Diagnosis not present

## 2022-10-12 DIAGNOSIS — E890 Postprocedural hypothyroidism: Secondary | ICD-10-CM | POA: Diagnosis not present

## 2022-10-12 DIAGNOSIS — E119 Type 2 diabetes mellitus without complications: Secondary | ICD-10-CM

## 2022-10-12 DIAGNOSIS — E785 Hyperlipidemia, unspecified: Secondary | ICD-10-CM

## 2022-10-12 LAB — POCT GLYCOSYLATED HEMOGLOBIN (HGB A1C): Hemoglobin A1C: 5.9 % — AB (ref 4.0–5.6)

## 2022-10-12 MED ORDER — TERCONAZOLE 0.4 % VA CREA: 1.0000 | TOPICAL_CREAM | Freq: Every day | VAGINAL | 1 refills | Status: DC

## 2022-10-12 NOTE — Patient Instructions (Addendum)
Please continue: - Farxiga 5 mg before b'fast - Mounjaro 7.5 mg weekly  Please start Terconazole Cream 0.4%: - insert 1 applicatorful vaginally at bedtime every night for 1 week, then - insert 1/2 applicatorful at bedtime once weekly indefinitely to maintain effect  Please return in 4-6 months with your sugar log.

## 2022-10-21 ENCOUNTER — Other Ambulatory Visit (HOSPITAL_COMMUNITY): Payer: Self-pay

## 2022-10-21 ENCOUNTER — Other Ambulatory Visit: Payer: Self-pay | Admitting: Internal Medicine

## 2022-10-21 MED ORDER — MOUNJARO 7.5 MG/0.5ML ~~LOC~~ SOAJ
7.5000 mg | SUBCUTANEOUS | 11 refills | Status: DC
  Filled 2022-10-21: qty 2, 28d supply, fill #0
  Filled 2022-11-17: qty 2, 28d supply, fill #1
  Filled 2022-12-15: qty 2, 28d supply, fill #2
  Filled 2023-01-12: qty 2, 28d supply, fill #3
  Filled 2023-01-30 – 2023-02-03 (×2): qty 2, 28d supply, fill #4
  Filled 2023-02-27: qty 2, 28d supply, fill #5
  Filled 2023-03-28: qty 2, 28d supply, fill #6

## 2022-12-16 ENCOUNTER — Other Ambulatory Visit (HOSPITAL_COMMUNITY): Payer: Self-pay

## 2022-12-23 NOTE — Addendum Note (Signed)
Addended by: Pollie Meyer on: 12/23/2022 11:30 AM   Modules accepted: Orders

## 2023-01-02 ENCOUNTER — Other Ambulatory Visit (HOSPITAL_COMMUNITY): Payer: Self-pay

## 2023-01-02 MED ORDER — IVABRADINE HCL 5 MG PO TABS
5.0000 mg | ORAL_TABLET | Freq: Two times a day (BID) | ORAL | 3 refills | Status: DC
  Filled 2023-01-02: qty 60, 30d supply, fill #0

## 2023-01-03 ENCOUNTER — Other Ambulatory Visit (HOSPITAL_COMMUNITY): Payer: Self-pay

## 2023-01-04 ENCOUNTER — Other Ambulatory Visit: Payer: Self-pay | Admitting: Internal Medicine

## 2023-01-04 DIAGNOSIS — E119 Type 2 diabetes mellitus without complications: Secondary | ICD-10-CM

## 2023-01-12 ENCOUNTER — Other Ambulatory Visit (HOSPITAL_COMMUNITY): Payer: Self-pay

## 2023-01-12 ENCOUNTER — Other Ambulatory Visit: Payer: Self-pay

## 2023-01-12 ENCOUNTER — Encounter (HOSPITAL_COMMUNITY): Payer: Self-pay

## 2023-01-12 MED ORDER — IVABRADINE HCL 5 MG PO TABS
5.0000 mg | ORAL_TABLET | Freq: Two times a day (BID) | ORAL | 3 refills | Status: DC
  Filled 2023-01-12 – 2023-01-30 (×2): qty 60, 30d supply, fill #0
  Filled 2023-02-27: qty 60, 30d supply, fill #1
  Filled 2023-03-28: qty 60, 30d supply, fill #2
  Filled 2023-04-26: qty 60, 30d supply, fill #3

## 2023-01-27 ENCOUNTER — Encounter: Payer: Self-pay | Admitting: Internal Medicine

## 2023-01-27 MED ORDER — LEVOTHYROXINE SODIUM 100 MCG PO TABS: 100.0000 ug | ORAL_TABLET | Freq: Every day | ORAL | 0 refills | Status: DC

## 2023-01-30 ENCOUNTER — Other Ambulatory Visit (HOSPITAL_COMMUNITY): Payer: Self-pay

## 2023-01-31 ENCOUNTER — Other Ambulatory Visit (HOSPITAL_COMMUNITY): Payer: Self-pay

## 2023-01-31 ENCOUNTER — Other Ambulatory Visit: Payer: Self-pay

## 2023-02-01 ENCOUNTER — Other Ambulatory Visit (HOSPITAL_COMMUNITY): Payer: Self-pay

## 2023-02-01 ENCOUNTER — Other Ambulatory Visit: Payer: Self-pay

## 2023-02-27 ENCOUNTER — Other Ambulatory Visit: Payer: Self-pay | Admitting: Internal Medicine

## 2023-02-28 ENCOUNTER — Other Ambulatory Visit (HOSPITAL_COMMUNITY): Payer: Self-pay

## 2023-02-28 MED ORDER — LEVOTHYROXINE SODIUM 100 MCG PO TABS
100.0000 ug | ORAL_TABLET | Freq: Every day | ORAL | 0 refills | Status: DC
  Filled 2023-02-28 – 2023-07-20 (×5): qty 90, 90d supply, fill #0

## 2023-03-21 ENCOUNTER — Other Ambulatory Visit: Payer: Self-pay | Admitting: Internal Medicine

## 2023-03-28 ENCOUNTER — Other Ambulatory Visit (HOSPITAL_COMMUNITY): Payer: Self-pay

## 2023-03-28 ENCOUNTER — Other Ambulatory Visit: Payer: Self-pay

## 2023-04-17 ENCOUNTER — Other Ambulatory Visit: Payer: Self-pay

## 2023-04-17 ENCOUNTER — Other Ambulatory Visit: Payer: Self-pay | Admitting: Internal Medicine

## 2023-04-21 ENCOUNTER — Ambulatory Visit (INDEPENDENT_AMBULATORY_CARE_PROVIDER_SITE_OTHER): Payer: No Typology Code available for payment source | Admitting: Internal Medicine

## 2023-04-21 ENCOUNTER — Encounter: Payer: Self-pay | Admitting: Internal Medicine

## 2023-04-21 ENCOUNTER — Other Ambulatory Visit (HOSPITAL_COMMUNITY): Payer: Self-pay

## 2023-04-21 VITALS — BP 122/80 | HR 83 | Ht 62.0 in | Wt 222.8 lb

## 2023-04-21 DIAGNOSIS — E1159 Type 2 diabetes mellitus with other circulatory complications: Secondary | ICD-10-CM

## 2023-04-21 DIAGNOSIS — E1169 Type 2 diabetes mellitus with other specified complication: Secondary | ICD-10-CM

## 2023-04-21 DIAGNOSIS — E890 Postprocedural hypothyroidism: Secondary | ICD-10-CM

## 2023-04-21 DIAGNOSIS — E66813 Obesity, class 3: Secondary | ICD-10-CM | POA: Diagnosis not present

## 2023-04-21 DIAGNOSIS — E785 Hyperlipidemia, unspecified: Secondary | ICD-10-CM

## 2023-04-21 DIAGNOSIS — Z7985 Long-term (current) use of injectable non-insulin antidiabetic drugs: Secondary | ICD-10-CM

## 2023-04-21 DIAGNOSIS — Z7984 Long term (current) use of oral hypoglycemic drugs: Secondary | ICD-10-CM

## 2023-04-21 MED ORDER — TERCONAZOLE 0.4 % VA CREA
1.0000 | TOPICAL_CREAM | Freq: Every day | VAGINAL | 5 refills | Status: DC
  Filled 2023-04-21 – 2023-06-24 (×2): qty 45, 7d supply, fill #0
  Filled 2023-07-20: qty 45, 7d supply, fill #1
  Filled 2023-09-20: qty 45, 7d supply, fill #2
  Filled 2023-10-19 – 2024-03-17 (×2): qty 45, 7d supply, fill #3
  Filled 2024-04-12: qty 45, 7d supply, fill #4

## 2023-04-21 MED ORDER — TIRZEPATIDE 10 MG/0.5ML ~~LOC~~ SOAJ
10.0000 mg | SUBCUTANEOUS | 3 refills | Status: DC
  Filled 2023-04-21: qty 2, 28d supply, fill #0
  Filled 2023-06-24: qty 2, 28d supply, fill #1
  Filled 2023-07-20: qty 2, 28d supply, fill #2
  Filled 2023-08-16: qty 6, 84d supply, fill #3
  Filled 2023-09-20 – 2023-10-19 (×3): qty 6, 84d supply, fill #4
  Filled ????-??-??: fill #4

## 2023-04-21 NOTE — Progress Notes (Signed)
 Patient ID: Ana [name], female   DOB: [birth year], 43 y.o.   MRN: [id number]  HPI: Ana [name] is a 43 y.o.-year-old female, returning for f/u DM2, dx summer 2014, non-insulin-dependent, controlled, without long-term complications and also Graves ds. Last visit 6 months ago.   Interim history: No increased urination, blurry vision, nausea. He continues to have occasional yeast infections.  Reviewed HbA1c levels: 03/29/2023: HbA1c 5.8% Lab Results  Component Value Date   HGBA1C 5.9 (A) 10/12/2022   HGBA1C 5.5 03/23/2022   HGBA1C 6.4 (A) 01/27/2021  12/01/2021: HbA1c 5.6% 08/26/2021: HbA1c 5.9% 10/2012: HbA1c 10.5% Previously 6.5%.   Patient is on a regimen of: - Farxiga  5 mg daily before breakfast-started 03/2022 - Mounjaro  7.5 >> 5 mg weekly (due to availability) >> 7.5 mg weekly Stopped metformin  03/2022. Stopped Bydureon  due to reaction to the injection sites. She was previously on Ozempic  before switching to Mounjaro . We had to stop Invokana  100 mg b/c repeated yeast inf. (09/26/2013) She was on JanuMet when she was prediabetic >> tolerated it well.   She has a One Touch Ultra meter.  She is not checking sugars lately, but up to 1 mo ago: - am:  90s >> 91-96 >> 91-100 >> 86-91 >> 91-95 >> 91-109 - 2h after b'fast: 98-118 >> n/c - lunch:  90-120 >> n/c - 2h after lunch:  up to 130 >> <130 >> n/c >> up to 131 >> 141 - dinner  90s-130s >> n/c >> 121-130 >>  <124 >> n/c - 2h after dinner: up to 130 >> <130 >> n/c Highest: 130 >> 124 >> n/c >> 131  -No CKD, last BUN/creatinine:   Lab Results  Component Value Date   BUN 11 03/07/2022   CREATININE 1.05 (H) 03/07/2022   Normal ACR:  Lab Results  Component Value Date   MICRALBCREAT 2.3 03/19/2020   MICRALBCREAT 2.0 10/26/2018   MICRALBCREAT 3.4 12/11/2017   MICRALBCREAT 10.3 08/03/2016   MICRALBCREAT 4.0 07/10/2015   MICRALBCREAT 5.7 02/05/2014   MICRALBCREAT 9.4 07/11/2013   MICRALBCREAT 36.3 (H)  03/07/2013  On Entresto . She had proteinuria after pregnancy 2010.   -+ HL. Last lipids:   Ref Range & Units 12/02/2022  Cholesterol, Total 100 - 199 mg/dL 797 High   Triglycerides 0 - 149 mg/dL 864  HDL >60 mg/dL 41  VLDL Cholesterol Cal 5 - 40 mg/dL 24  LDL 0 - 99 mg/dL 862 High   LDL/HDL Ratio 0.0 - 3.2 ratio 3.3 High    08/26/2021: 222/134/49/149 03/23/2021: 177/134/37/116 Lab Results  Component Value Date   CHOL 184 03/19/2020   HDL 41.30 03/19/2020   LDLCALC 117 (H) 03/19/2020   LDLDIRECT 97.0 06/05/2018   TRIG 126.0 03/19/2020   CHOLHDL 4 03/19/2020  On pravastatin  20 >> lipitor 20 mg daily.  - Last eye exam 04/2022: No DR reportedly.  - no numbness and tingling in her feet.  Last foot exam 10/12/2022.  In 2023, she was found to have an abnormal EKG (left bundle branch block, left atrial enlargement) during cardiology eval. in preparartion for weight loss surgery. 2D Echo showed a decrease in EF (30%). Heart catheterization did not show CAD.  CT angio did not show a PE but showed left ventricular enlargement.    She started Entresto  and carvedilol  >> changed to metoprolol .  H/o Graves ds.:  Reviewed her treatment history: 02/2014: started MMI 10 mg twice a day MMI tapered down 09/2015: stopped MMI 07/2016: restarted MMI 5 mg daily 06/2017:  MMI decrease to 2.5 mg daily  11/2017: MMI stopped again 04/2019: MMI restarted at 5 mg twice a day 06/2019: MMI decrease to 5 mg daily 08/2019: MMI decrease to 2.5 mg daily Thyroid  uptake (04/29/2020): Increased, at 44.5% RAI treatment (06/03/2020) Levothyroxine  started 08/2020.  She is on levothyroxine  100 mcg daily (dose increased 01/2021, decreased 03/2021): - in am - fasting - at least 1h from b'fast - no calcium  - no iron - + multivitamins after lunch >> at night - no PPIs - not on Biotin - on B12 at night>> stopped  Reviewed her TFTs:  Lab Results  Component Value Date   TSH 2.213 03/07/2022   TSH  1.85 05/27/2021   TSH 13.71 (H) 01/27/2021   TSH 43.95 (H) 11/12/2020   TSH 6.43 (H) 08/18/2020   FREET4 1.00 05/27/2021   FREET4 0.65 01/27/2021   FREET4 0.40 (L) 11/12/2020   FREET4 0.37 (L) 08/18/2020   FREET4 1.42 07/10/2020   Lab Results  Component Value Date   T3FREE 2.5 08/18/2020   T3FREE 4.3 (H) 07/10/2020   T3FREE 3.2 03/19/2020   T3FREE 3.0 09/09/2019   T3FREE 3.0 07/15/2019   T3FREE 6.0 (H) 04/24/2019   T3FREE 3.2 10/26/2018   T3FREE 3.2 06/05/2018   T3FREE 3.5 01/30/2018   T3FREE 4.1 12/11/2017  08/26/2021: TSH 2.79 03/23/2021: TSH 0.057  At last check, her Graves' antibodies were again detectable: Lab Results  Component Value Date   TSI 221 (H) 03/19/2020   TSI <89 01/30/2018   TSI 210 (H) 04/06/2017   TSI 152 (H) 02/11/2016   TSI 188 (H) 12/18/2013   She also has a history of PCOS, HTN, vitamin D  deficiency. On Mirena  IUD.  ROS: + See HPI  I reviewed pt's medications, allergies, PMH, social hx, family hx, and changes were documented in the history of present illness. Otherwise, unchanged from my initial visit note.  Past Medical History:  Diagnosis Date   CHF (congestive heart failure) (HCC) 02/18/2022   DM2 (diabetes mellitus, type 2) (HCC) 01/2013 dx   Hypertension    Other and unspecified hyperlipidemia    PCOS (polycystic ovarian syndrome)    Past Surgical History:  Procedure Laterality Date   CESAREAN SECTION  2010   Social History   Socioeconomic History   Marital status: Married    Spouse name: Not on file   Number of children: Not on file   Years of education: Not on file   Highest education level: Not on file  Occupational History   Not on file  Tobacco Use   Smoking status: Never   Smokeless tobacco: Never  Substance and Sexual Activity   Alcohol use: No   Drug use: No   Sexual activity: Yes    Partners: Male  Other Topics Concern   Not on file  Social History Narrative   Regular exercise: no   Caffeine use: 2 soda's  daily   Lives with spouse and son   Works at [employer] as psychologist, occupational   Social Drivers of Corporate Investment Banker Strain: Medium Risk (06/14/2022)   Received from [employer], [facility]   Overall Financial Resource Strain (CARDIA)    Difficulty of Paying Living Expenses: Somewhat hard  Food Insecurity: No Food Insecurity (06/14/2022)   Received from [facility], [facility]   Hunger Vital Sign    Worried About Running Out of Food in the Last Year: Never true    Ran Out of Food in the Last Year: Never  true  Transportation Needs: No Transportation Needs (06/14/2022)   Received from [facility], [facility]   [facility] - Transportation    Lack of Transportation (Medical): No    Lack of Transportation (Non-Medical): No  Physical Activity: Insufficiently Active (06/14/2022)   Received from [facility], [facility]   Exercise Vital Sign    Days of Exercise per Week: 1 day    Minutes of Exercise per Session: 30 min  Stress: Not on file (02/19/2023)  Social Connections: Socially Integrated (06/14/2022)   Received from [facility], [facility]   Social Network    How would you rate your social network (family, work, friends)?: Good participation with social networks  Intimate Partner Violence: Not At Risk (06/14/2022)   Received from [facility], [facility]   HITS    Over the last 12 months how often did your partner physically hurt you?: Never    Over the last 12 months how often did your partner insult you or talk down to you?: Never    Over the last 12 months how often did your partner threaten you with physical harm?: Never    Over the last 12 months how often did your partner scream or curse at you?: Never   Current Outpatient Medications on File Prior to Visit  Medication Sig Dispense Refill   ASPIRIN LOW DOSE 81 MG tablet Take 81 mg by mouth daily.     atorvastatin  (LIPITOR) 20 MG tablet Take 20 mg by mouth daily.     dapagliflozin  propanediol (FARXIGA ) 5 MG TABS  tablet TAKE 1 TABLET BY MOUTH DAILY BEFORE BREAKFAST. 90 tablet 0   glucose blood test strip Use 2 times day as instructed - One Touch Ultra 100 each 11   ibuprofen  (ADVIL ) 800 MG tablet TAKE 1 TABLET BY MOUTH EVERY 8 HOURS AS NEEDED (Patient not taking: Reported on 10/12/2022) 90 tablet 1   ivabradine  (CORLANOR ) 5 MG TABS tablet Take 1 tablet (5 mg total) by mouth 2 (two) times daily. 60 tablet 3   levonorgestrel  (MIRENA ) 20 MCG/24HR IUD 1 Intra Uterine Device (1 each total) by Intrauterine route once. Placed 07/19/13 - gyn 1 each 0   levothyroxine  (SYNTHROID ) 100 MCG tablet Take 1 tablet (100 mcg total) by mouth daily before breakfast. 90 tablet 0   metoprolol  succinate (TOPROL -XL) 100 MG 24 hr tablet Take by mouth. (Patient not taking: Reported on 10/12/2022)     pravastatin  (PRAVACHOL ) 20 MG tablet Take 1 tablet daily (Patient not taking: Reported on 10/12/2022) 90 tablet 3   sacubitril -valsartan  (ENTRESTO ) 24-26 MG Take by mouth.     terconazole  (TERAZOL 7 ) 0.4 % vaginal cream PLACE 1 APPLICATOR VAGINALLY AT BEDTIME. 45 g 1   tirzepatide  (MOUNJARO ) 7.5 MG/0.5ML Pen Inject 7.5 mg into the skin once a week. 2 mL 11   No current facility-administered medications on file prior to visit.   Allergies  Allergen Reactions   Invokana  [Canagliflozin ]     Recurrent yeast infections   Family History  Problem Relation Age of Onset   Diabetes Mother        Type II   Thyroid  disease Mother    Hypertension Mother    Hyperlipidemia Mother    Diabetes Father        TYPE II   Hypertension Father    Hyperlipidemia Father    Heart disease Father        CAD with stent   Hyperlipidemia Maternal Grandmother    Hypertension Maternal Grandmother  Obesity Maternal Grandmother    Diabetes Maternal Grandmother    Cancer Paternal Grandmother 23       colon cancer   Diabetes Paternal Grandmother    PE: BP 122/80   Pulse 83   Ht 5' 2 (1.575 m)   Wt 222 lb 12.8 oz (101.1 kg)   SpO2 99%   BMI 40.75  kg/m    Wt Readings from Last 3 Encounters:  04/21/23 222 lb 12.8 oz (101.1 kg)  10/12/22 222 lb (100.7 kg)  03/23/22 209 lb 3.2 oz (94.9 kg)   Constitutional: overweight, in NAD Eyes: EOMI, no exophthalmos ENT: no masses palpated in neck, no cervical lymphadenopathy Cardiovascular: RRR, No MRG Respiratory: CTA B Musculoskeletal: no deformities Skin: o rashes Neurological: no tremor with outstretched hands  ASSESSMENT: 1. DM2, non-insulin-dependent, controlled, with complications - CHF  2. Graves ds - 03/12/2014: Thyroid  uptake and scan: Consistent with Graves' disease: The 24 hr uptake by the thyroid  gland is 65%. Normal 24 hr uptake is 10-30%. On thyroid  imaging, the thyroid  activity appears diffusely increased. No focal hot or cold nodules are identified.  3.  Hyperlipidemia  4. Obesity class III  PLAN:  1. Patient with well-controlled type 2 diabetes, on SGLT2 inhibitor and GLP-1/GIP receptor agonist.  We stopped metformin  and starting Farxiga  after her new diagnosis of CHF at the end of 2023.  We continue to Mounjaro .  Of note, she did not have a persistent response to Ozempic  in the past.  At last visit sugars were well-controlled so we did not change her regimen.  However, she was having yeast infections and I recommended terconazole  cream. -At today's visit, she is not checking blood sugars.  After 1 month ago, she was checking on sporadically and they were at goal. -She continues to have yeast infections, but upon questioning, she is not using the maintenance dose of terconazole .  I refilled this for her and recommended to use half an applicatorful once a week for maintaining the effect -I advised her to: Patient Instructions  Please continue: - Farxiga  5 mg before b'fast  You can increase: - Mounjaro  10 mg weekly  Please restart Terconazole  Cream 0.4%: - insert 1 applicatorful vaginally at bedtime every night for 1 week, then - insert 1/2 applicatorful at  bedtime once weekly indefinitely to maintain effect  Please return in 4-6 months with your sugar log.   - advised to check sugars at different times of the day - 1x a day, rotating check times - advised for yearly eye exams >> she is UTD and has an appointment coming up - return to clinic in 4-6 months  2.  Post ablative hypothyroidism -Patient with history of recurrent Graves' disease, with minimal response to methimazole .  A thyroid  uptake was elevated, at 44.5% in 04/2020.  She had RAI treatment in 05/2020 and developed post ablative hypothyroidism. - latest thyroid  labs reviewed with pt. >> normal last month: 1.82 - she continues on LT4 100 mcg daily - pt feels good on this dose. - we discussed about taking the thyroid  hormone every day, with water, >30 minutes before breakfast, separated by >4 hours from acid reflux medications, calcium , iron, multivitamins. Pt. is taking it correctly.  3.  Hyperlipidemia -Latest lipid panel from 03/2023 and fractions were lower, with the LDL also improved, but still above our target of less than 55 due to history of cardiovascular disease -She continues atorvastatin  20 mg daily.  She could not tolerate the 40 mg daily due  to joint pains.  4.  Obesity class III -Previously on Belviq (but no significant weight loss with this), however, this is now taken off the market -She was doing well on Qsymia  but this was not approved by her insurance -She developed injection site reaction to Bydureon  in the past -She initially lost a significant amount of weight (approximately 18 pounds since starting Ozempic ) but then gained them back -She continues on Mounjaro  -weight is stable since last visit.  She is interested in increasing the dose.  I did advise her to start checking blood sugars and make sure she is not dropping them due to the possibility of Mounjaro  increasing her insulin secretion.  Otherwise, we can increase the dose to 10 mg weekly.  Lela Fendt, MD PhD [facility] Endocrinology

## 2023-04-21 NOTE — Patient Instructions (Addendum)
 Please continue: - Farxiga  5 mg before b'fast  You can increase: - Mounjaro  10 mg weekly  Please restart Terconazole  Cream 0.4%: - insert 1 applicatorful vaginally at bedtime every night for 1 week, then - insert 1/2 applicatorful at bedtime once weekly indefinitely to maintain effect  Please return in 4-6 months with your sugar log.

## 2023-04-26 ENCOUNTER — Other Ambulatory Visit (HOSPITAL_COMMUNITY): Payer: Self-pay

## 2023-04-27 ENCOUNTER — Other Ambulatory Visit: Payer: Self-pay

## 2023-05-02 ENCOUNTER — Other Ambulatory Visit (HOSPITAL_COMMUNITY): Payer: Self-pay

## 2023-05-02 MED ORDER — SPIRONOLACTONE 25 MG PO TABS
12.5000 mg | ORAL_TABLET | Freq: Every morning | ORAL | 5 refills | Status: DC
  Filled 2023-05-02: qty 30, 60d supply, fill #0
  Filled 2023-05-28 – 2023-06-24 (×2): qty 30, 60d supply, fill #1
  Filled 2023-07-20 – 2023-08-16 (×4): qty 30, 60d supply, fill #2
  Filled 2023-09-20: qty 30, 60d supply, fill #3
  Filled 2023-10-05: qty 30, 60d supply, fill #4

## 2023-05-16 LAB — HM DIABETES EYE EXAM

## 2023-05-25 ENCOUNTER — Other Ambulatory Visit (HOSPITAL_COMMUNITY): Payer: Self-pay

## 2023-05-25 MED ORDER — IVABRADINE HCL 5 MG PO TABS
5.0000 mg | ORAL_TABLET | Freq: Two times a day (BID) | ORAL | 3 refills | Status: DC
  Filled 2023-05-25: qty 60, 30d supply, fill #0
  Filled 2023-06-24: qty 60, 30d supply, fill #1
  Filled 2023-07-20: qty 60, 30d supply, fill #2
  Filled 2023-08-16 (×2): qty 60, 30d supply, fill #3

## 2023-05-28 ENCOUNTER — Other Ambulatory Visit: Payer: Self-pay

## 2023-06-26 ENCOUNTER — Other Ambulatory Visit (HOSPITAL_COMMUNITY): Payer: Self-pay

## 2023-06-26 ENCOUNTER — Other Ambulatory Visit: Payer: Self-pay

## 2023-06-27 ENCOUNTER — Other Ambulatory Visit (HOSPITAL_COMMUNITY): Payer: Self-pay

## 2023-07-20 ENCOUNTER — Other Ambulatory Visit (HOSPITAL_COMMUNITY): Payer: Self-pay

## 2023-07-20 ENCOUNTER — Other Ambulatory Visit: Payer: Self-pay | Admitting: Internal Medicine

## 2023-07-20 ENCOUNTER — Other Ambulatory Visit: Payer: Self-pay

## 2023-07-20 MED ORDER — DAPAGLIFLOZIN PROPANEDIOL 5 MG PO TABS
5.0000 mg | ORAL_TABLET | Freq: Every day | ORAL | 1 refills | Status: DC
  Filled 2023-07-20: qty 90, 90d supply, fill #0
  Filled 2023-09-20 – 2023-10-19 (×3): qty 90, 90d supply, fill #1

## 2023-08-01 ENCOUNTER — Other Ambulatory Visit (HOSPITAL_COMMUNITY): Payer: Self-pay

## 2023-08-16 ENCOUNTER — Other Ambulatory Visit: Payer: Self-pay

## 2023-08-16 ENCOUNTER — Other Ambulatory Visit (HOSPITAL_COMMUNITY): Payer: Self-pay

## 2023-09-12 ENCOUNTER — Other Ambulatory Visit: Payer: Self-pay

## 2023-09-18 ENCOUNTER — Other Ambulatory Visit (HOSPITAL_COMMUNITY): Payer: Self-pay

## 2023-09-18 ENCOUNTER — Encounter (HOSPITAL_COMMUNITY): Payer: Self-pay

## 2023-09-18 MED ORDER — IVABRADINE HCL 5 MG PO TABS
5.0000 mg | ORAL_TABLET | Freq: Two times a day (BID) | ORAL | 3 refills | Status: DC
  Filled 2023-09-18 – 2023-10-02 (×4): qty 60, 30d supply, fill #0
  Filled 2023-10-03: qty 14, 7d supply, fill #0
  Filled 2023-10-05: qty 60, 30d supply, fill #1
  Filled 2023-11-05 – 2023-11-08 (×4): qty 60, 30d supply, fill #2
  Filled 2023-12-03: qty 60, 30d supply, fill #3
  Filled 2023-12-31: qty 40, 20d supply, fill #4

## 2023-09-19 ENCOUNTER — Other Ambulatory Visit (HOSPITAL_COMMUNITY): Payer: Self-pay

## 2023-09-20 ENCOUNTER — Other Ambulatory Visit (HOSPITAL_COMMUNITY): Payer: Self-pay

## 2023-09-21 ENCOUNTER — Other Ambulatory Visit (HOSPITAL_COMMUNITY): Payer: Self-pay

## 2023-09-21 ENCOUNTER — Other Ambulatory Visit: Payer: Self-pay

## 2023-10-02 ENCOUNTER — Other Ambulatory Visit (HOSPITAL_COMMUNITY): Payer: Self-pay

## 2023-10-03 ENCOUNTER — Other Ambulatory Visit (HOSPITAL_COMMUNITY): Payer: Self-pay

## 2023-10-05 ENCOUNTER — Encounter (HOSPITAL_COMMUNITY): Payer: Self-pay

## 2023-10-05 ENCOUNTER — Other Ambulatory Visit: Payer: Self-pay

## 2023-10-05 ENCOUNTER — Other Ambulatory Visit (HOSPITAL_COMMUNITY): Payer: Self-pay

## 2023-10-06 ENCOUNTER — Other Ambulatory Visit (HOSPITAL_COMMUNITY): Payer: Self-pay

## 2023-10-07 ENCOUNTER — Other Ambulatory Visit (HOSPITAL_COMMUNITY): Payer: Self-pay

## 2023-10-09 ENCOUNTER — Other Ambulatory Visit (HOSPITAL_COMMUNITY): Payer: Self-pay

## 2023-10-11 ENCOUNTER — Other Ambulatory Visit: Payer: Self-pay | Admitting: Internal Medicine

## 2023-10-11 ENCOUNTER — Other Ambulatory Visit (HOSPITAL_COMMUNITY): Payer: Self-pay

## 2023-10-11 MED ORDER — LEVOTHYROXINE SODIUM 100 MCG PO TABS
100.0000 ug | ORAL_TABLET | Freq: Every day | ORAL | 0 refills | Status: DC
  Filled 2023-10-19 – 2024-03-18 (×2): qty 90, 90d supply, fill #0

## 2023-10-13 ENCOUNTER — Other Ambulatory Visit (HOSPITAL_COMMUNITY): Payer: Self-pay

## 2023-10-13 ENCOUNTER — Encounter (HOSPITAL_COMMUNITY): Payer: Self-pay

## 2023-10-13 MED ORDER — CARVEDILOL 12.5 MG PO TABS
12.5000 mg | ORAL_TABLET | Freq: Two times a day (BID) | ORAL | 3 refills | Status: AC
  Filled 2023-10-13: qty 180, 90d supply, fill #0
  Filled 2024-03-19: qty 180, 90d supply, fill #1
  Filled 2024-04-12: qty 180, 90d supply, fill #2

## 2023-10-13 MED ORDER — SPIRONOLACTONE 25 MG PO TABS
12.5000 mg | ORAL_TABLET | Freq: Every morning | ORAL | 3 refills | Status: AC
  Filled 2023-10-13 – 2023-12-03 (×3): qty 45, 90d supply, fill #0
  Filled 2024-03-17: qty 45, 90d supply, fill #1
  Filled 2024-04-12: qty 45, 90d supply, fill #2

## 2023-10-13 MED ORDER — ENTRESTO 24-26 MG PO TABS
1.0000 | ORAL_TABLET | Freq: Two times a day (BID) | ORAL | 3 refills | Status: DC
  Filled 2023-10-13 – 2023-10-19 (×2): qty 180, 90d supply, fill #0

## 2023-10-17 ENCOUNTER — Other Ambulatory Visit (HOSPITAL_COMMUNITY): Payer: Self-pay

## 2023-10-18 ENCOUNTER — Other Ambulatory Visit (HOSPITAL_COMMUNITY): Payer: Self-pay

## 2023-10-18 ENCOUNTER — Encounter (HOSPITAL_COMMUNITY): Payer: Self-pay

## 2023-10-18 ENCOUNTER — Telehealth: Payer: Self-pay

## 2023-10-18 MED ORDER — ATORVASTATIN CALCIUM 20 MG PO TABS
20.0000 mg | ORAL_TABLET | Freq: Every day | ORAL | 1 refills | Status: AC
  Filled 2023-10-18 – 2024-03-19 (×2): qty 90, 90d supply, fill #0
  Filled 2024-04-12: qty 90, 90d supply, fill #1

## 2023-10-18 MED ORDER — SACUBITRIL-VALSARTAN 24-26 MG PO TABS
1.0000 | ORAL_TABLET | Freq: Two times a day (BID) | ORAL | 3 refills | Status: AC
  Filled 2023-10-18: qty 60, 30d supply, fill #0
  Filled 2023-10-30 – 2023-11-06 (×4): qty 180, 90d supply, fill #0
  Filled 2024-02-13 (×2): qty 60, 30d supply, fill #1
  Filled 2024-03-17: qty 60, 30d supply, fill #2
  Filled 2024-04-12: qty 60, 30d supply, fill #3
  Filled 2024-05-10: qty 60, 30d supply, fill #4

## 2023-10-18 NOTE — Telephone Encounter (Signed)
 Pharmacy Patient Advocate Encounter   Received notification from CoverMyMeds that prior authorization for Farxiga  5mg  is required/requested.   Insurance verification completed.   The patient is insured through [employer]  .   Per test claim: PA required; PA submitted to above mentioned insurance via CoverMyMeds Key/confirmation #/EOC [id number] Status is pending

## 2023-10-19 ENCOUNTER — Other Ambulatory Visit (HOSPITAL_COMMUNITY): Payer: Self-pay

## 2023-10-19 ENCOUNTER — Other Ambulatory Visit: Payer: Self-pay

## 2023-10-23 NOTE — Telephone Encounter (Signed)
 Pharmacy Patient Advocate Encounter  Received notification from [employer] that Prior Authorization for Farxiga  5mg  has been APPROVED from 10/23/23 to 05/18/2024

## 2023-10-25 ENCOUNTER — Other Ambulatory Visit (HOSPITAL_COMMUNITY): Payer: Self-pay

## 2023-10-25 MED ORDER — MOUNJARO 10 MG/0.5ML ~~LOC~~ SOAJ
10.0000 mg | SUBCUTANEOUS | 1 refills | Status: DC
  Filled 2023-10-30 – 2023-11-08 (×6): qty 6, 84d supply, fill #0
  Filled 2024-03-17: qty 6, 84d supply, fill #1

## 2023-10-30 ENCOUNTER — Other Ambulatory Visit: Payer: Self-pay

## 2023-10-30 ENCOUNTER — Other Ambulatory Visit (HOSPITAL_COMMUNITY): Payer: Self-pay

## 2023-10-30 ENCOUNTER — Encounter (HOSPITAL_COMMUNITY): Payer: Self-pay

## 2023-10-31 ENCOUNTER — Telehealth: Payer: Self-pay

## 2023-10-31 ENCOUNTER — Encounter (HOSPITAL_COMMUNITY): Payer: Self-pay

## 2023-10-31 ENCOUNTER — Ambulatory Visit: Payer: No Typology Code available for payment source | Admitting: Internal Medicine

## 2023-10-31 ENCOUNTER — Encounter: Payer: Self-pay | Admitting: Internal Medicine

## 2023-10-31 ENCOUNTER — Other Ambulatory Visit (HOSPITAL_COMMUNITY): Payer: Self-pay

## 2023-10-31 VITALS — BP 120/72 | HR 85 | Ht 62.0 in | Wt 219.4 lb

## 2023-10-31 DIAGNOSIS — E1169 Type 2 diabetes mellitus with other specified complication: Secondary | ICD-10-CM

## 2023-10-31 DIAGNOSIS — Z7985 Long-term (current) use of injectable non-insulin antidiabetic drugs: Secondary | ICD-10-CM

## 2023-10-31 DIAGNOSIS — E785 Hyperlipidemia, unspecified: Secondary | ICD-10-CM

## 2023-10-31 DIAGNOSIS — E1159 Type 2 diabetes mellitus with other circulatory complications: Secondary | ICD-10-CM | POA: Diagnosis not present

## 2023-10-31 DIAGNOSIS — Z7984 Long term (current) use of oral hypoglycemic drugs: Secondary | ICD-10-CM

## 2023-10-31 DIAGNOSIS — E66813 Obesity, class 3: Secondary | ICD-10-CM | POA: Diagnosis not present

## 2023-10-31 MED ORDER — DAPAGLIFLOZIN PROPANEDIOL 5 MG PO TABS
5.0000 mg | ORAL_TABLET | Freq: Every day | ORAL | 3 refills | Status: DC
  Filled 2023-10-31 (×2): qty 90, 90d supply, fill #0
  Filled 2024-02-13 – 2024-02-14 (×2): qty 90, 90d supply, fill #1
  Filled 2024-03-18 – 2024-04-12 (×2): qty 90, 90d supply, fill #2

## 2023-10-31 NOTE — Telephone Encounter (Signed)
 KEY:[id number]

## 2023-10-31 NOTE — Progress Notes (Signed)
 Patient ID: Ana [name], female   DOB: 23-Nov-1980, 43 y.o.   MRN: [id number]  HPI: Ana [name] is a 43 y.o.-year-old female, returning for f/u DM2, dx summer 2014, non-insulin-dependent, controlled, without long-term complications and also Graves ds. Last visit 6 months ago.   Interim history: No increased urination, blurry vision, but has some nausea and she vomited 1x. He continues to have occasional yeast infections,   Reviewed HbA1c levels: 10/25/2023: HbA1c 5.9% 03/29/2023: HbA1c 5.8% Lab Results  Component Value Date   HGBA1C 5.9 (A) 10/12/2022   HGBA1C 5.5 03/23/2022   HGBA1C 6.4 (A) 01/27/2021  12/01/2021: HbA1c 5.6% 08/26/2021: HbA1c 5.9% 10/2012: HbA1c 10.5% Previously 6.5%.   Patient is on a regimen of: - Farxiga  5 mg daily before breakfast-started 03/2022 - Mounjaro  7.5 >> 5 mg weekly (due to availability) >> 7.5 >> 10 mg weekly Stopped metformin  03/2022. Stopped Bydureon  due to reaction to the injection sites. She was previously on Ozempic  before switching to Mounjaro . We had to stop Invokana  100 mg b/c repeated yeast inf. (09/26/2013) She was on JanuMet when she was prediabetic >> tolerated it well.   She has a One Touch Ultra meter.  She is checking sugars 0-1x a day: - am: 91-96 >> 91-100 >> 86-91 >> 91-95 >> 91-109 >> 91-110 - 2h after b'fast: 98-118 >> n/c - lunch:  90-120 >> n/c - 2h after lunch:  <130 >> n/c >> up to 131 >> 141 >> <140 - dinner  90s-130s >> n/c >> 121-130 >>  <124 >> n/c - 2h after dinner: up to 130 >> <130 >> n/c Highest: 130 >> 124 >> n/c >> 131 >> 140  -No CKD - seeing nephrology, last BUN/creatinine:   Lab Results  Component Value Date   BUN 11 03/07/2022   CREATININE 1.05 (H) 03/07/2022   ACR:   No results found for: MICRALBCREAT On Entresto . She had proteinuria after pregnancy 43   -+ HL. Last lipids:   Lab Results  Component Value Date   CHOL 184 03/19/2020   HDL 41.30 03/19/2020   LDLCALC 117 (H)  03/19/2020   LDLDIRECT 97.0 06/05/2018   TRIG 126.0 03/19/2020   CHOLHDL 4 03/19/2020  On pravastatin  20 >> lipitor 20 mg daily.  - Last eye exam 05/16/2023: + DR  - no numbness and tingling in her feet.  Last foot exam 10/12/2022.  In 2023, she was found to have an abnormal EKG (left bundle branch block, left atrial enlargement) during cardiology eval. in preparartion for weight loss surgery. 2D Echo showed a decrease in EF (30%). Heart catheterization did not show CAD.  CT angio did not show a PE but showed left ventricular enlargement.    She started Entresto  and carvedilol  >> changed to metoprolol .  H/o Graves ds.:  Reviewed her treatment history: 02/2014: started MMI 10 mg twice a day MMI tapered down 09/2015: stopped MMI 07/2016: restarted MMI 5 mg daily 06/2017: MMI decrease to 2.5 mg daily  11/2017: MMI stopped again 04/2019: MMI restarted at 5 mg twice a day 06/2019: MMI decrease to 5 mg daily 08/2019: MMI decrease to 2.5 mg daily Thyroid  uptake (04/29/2020): Increased, at 44.5% RAI treatment (06/03/2020) Levothyroxine  started 08/2020.  She is on levothyroxine  100 mcg daily (dose increased 01/2021, decreased 03/2021): - in am - fasting - at least 1h from b'fast - no calcium  - no iron - + multivitamins after lunch >> at night - no PPIs - not on Biotin - on B12 at  night>> stopped  Reviewed her TFTs:   Lab Results  Component Value Date   TSH 2.213 03/07/2022   TSH 1.85 05/27/2021   TSH 13.71 (H) 01/27/2021   TSH 43.95 (H) 11/12/2020   TSH 6.43 (H) 08/18/2020   FREET4 1.00 05/27/2021   FREET4 0.65 01/27/2021   FREET4 0.40 (L) 11/12/2020   FREET4 0.37 (L) 08/18/2020   FREET4 1.42 07/10/2020   Lab Results  Component Value Date   T3FREE 2.5 08/18/2020   T3FREE 4.3 (H) 07/10/2020   T3FREE 3.2 03/19/2020   T3FREE 3.0 09/09/2019   T3FREE 3.0 07/15/2019   T3FREE 6.0 (H) 04/24/2019   T3FREE 3.2 10/26/2018   T3FREE 3.2 06/05/2018   T3FREE 3.5 01/30/2018    T3FREE 4.1 12/11/2017  08/26/2021: TSH 2.79 03/23/2021: TSH 0.057  At last check, her Graves' antibodies were again detectable: Lab Results  Component Value Date   TSI 221 (H) 03/19/2020   TSI <89 01/30/2018   TSI 210 (H) 04/06/2017   TSI 152 (H) 02/11/2016   TSI 188 (H) 12/18/2013   She also has a history of PCOS, HTN, vitamin D  deficiency. On Mirena  IUD.  ROS: + See HPI  I reviewed pt's medications, allergies, PMH, social hx, family hx, and changes were documented in the history of present illness. Otherwise, unchanged from my initial visit note.  Past Medical History:  Diagnosis Date   CHF (congestive heart failure) (HCC) 02/18/2022   DM2 (diabetes mellitus, type 2) (HCC) 01/2013 dx   Hypertension    Other and unspecified hyperlipidemia    PCOS (polycystic ovarian syndrome)    Past Surgical History:  Procedure Laterality Date   CESAREAN SECTION  2010   Social History   Socioeconomic History   Marital status: Married    Spouse name: Not on file   Number of children: Not on file   Years of education: Not on file   Highest education level: Not on file  Occupational History   Not on file  Tobacco Use   Smoking status: Never   Smokeless tobacco: Never  Substance and Sexual Activity   Alcohol use: No   Drug use: No   Sexual activity: Yes    Partners: Male  Other Topics Concern   Not on file  Social History Narrative   Regular exercise: no   Caffeine use: 2 soda's daily   Lives with spouse and son   Works at [employer] as Psychologist, occupational   Social Drivers of Corporate investment banker Strain: Low Risk  (10/21/2023)   Received from [facility]   Overall Financial Resource Strain (CARDIA)    Difficulty of Paying Living Expenses: Not very hard  Food Insecurity: No Food Insecurity (10/21/2023)   [facility]   Hunger Vital Sign    Within the past 12 months, you worried that your food would run out before you got the money to buy more.: Never true    Within  the past 12 months, the food you bought just didn't last and you didn't have money to get more.: Never true  Transportation Needs: No Transportation Needs (10/21/2023)   Received from [facility] - Transportation    Lack of Transportation (Medical): No    Lack of Transportation (Non-Medical): No  Physical Activity: Insufficiently Active (10/21/2023)   Received from [facility]   Exercise Vital Sign    On average, how many days per week do you engage in moderate to strenuous exercise (like a brisk  walk)?: 3 days    On average, how many minutes do you engage in exercise at this level?: 10 min  Stress: Stress Concern Present (10/21/2023)   Received from [facility] - Occupational Stress Questionnaire    Feeling of Stress : Very much  Social Connections: Moderately Integrated (10/21/2023)   Received from [facility]   Social Network    How would you rate your social network (family, work, friends)?: Adequate participation with social networks  Intimate Partner Violence: Not At Risk (10/21/2023)   Received from [facility]   HITS    Over the last 12 months how often did your partner physically hurt you?: Never    Over the last 12 months how often did your partner insult you or talk down to you?: Rarely    Over the last 12 months how often did your partner threaten you with physical harm?: Never    Over the last 12 months how often did your partner scream or curse at you?: Rarely   Current Outpatient Medications on File Prior to Visit  Medication Sig Dispense Refill   ASPIRIN LOW DOSE 81 MG tablet Take 81 mg by mouth daily.     atorvastatin  (LIPITOR) 20 MG tablet Take 20 mg by mouth daily.     atorvastatin  (LIPITOR) 20 MG tablet Take 1 tablet (20 mg total) by mouth daily. 90 tablet 1   carvedilol  (COREG ) 12.5 MG tablet Take 1 tablet (12.5 mg total) by mouth 2 (two) times daily. 180 tablet 3   dapagliflozin  propanediol (FARXIGA ) 5 MG TABS  tablet Take 1 tablet (5 mg total) by mouth daily before breakfast. 90 tablet 1   glucose blood test strip Use 2 times day as instructed - One Touch Ultra 100 each 11   ibuprofen  (ADVIL ) 800 MG tablet TAKE 1 TABLET BY MOUTH EVERY 8 HOURS AS NEEDED 90 tablet 1   ivabradine  (CORLANOR ) 5 MG TABS tablet Take 1 tablet (5 mg total) by mouth 2 (two) times daily. 60 tablet 3   levonorgestrel  (MIRENA ) 20 MCG/24HR IUD 1 Intra Uterine Device (1 each total) by Intrauterine route once. Placed 07/19/13 - gyn 1 each 0   levothyroxine  (SYNTHROID ) 100 MCG tablet Take 1 tablet (100 mcg total) by mouth daily before breakfast. 90 tablet 0   sacubitril -valsartan  (ENTRESTO ) 24-26 MG Take by mouth.     sacubitril -valsartan  (ENTRESTO ) 24-26 MG Take 1 tablet by mouth 2 (two) times daily. 180 tablet 3   sacubitril -valsartan  (ENTRESTO ) 24-26 MG Take 1 tablet by mouth 2 (two) times daily. 180 tablet 3   spironolactone  (ALDACTONE ) 25 MG tablet Take 0.5 tablets (12.5 mg total) by mouth in the morning. 30 tablet 5   spironolactone  (ALDACTONE ) 25 MG tablet Take 0.5 tablets (12.5 mg total) by mouth every morning. 45 tablet 3   terconazole  (TERAZOL 7 ) 0.4 % vaginal cream Place 1 applicator vaginally at bedtime. 45 g 5   tirzepatide  (MOUNJARO ) 10 MG/0.5ML Pen Inject 10 mg into the skin once a week. 6 mL 3   tirzepatide  (MOUNJARO ) 10 MG/0.5ML Pen Inject 10 mg into the skin once a week. 6 mL 1   No current facility-administered medications on file prior to visit.   Allergies  Allergen Reactions   Invokana  [Canagliflozin ]     Recurrent yeast infections   Family History  Problem Relation Age of Onset   Diabetes Mother        Type II   Thyroid  disease Mother  Hypertension Mother    Hyperlipidemia Mother    Diabetes Father        TYPE II   Hypertension Father    Hyperlipidemia Father    Heart disease Father        CAD with stent   Hyperlipidemia Maternal Grandmother    Hypertension Maternal Grandmother    Obesity  Maternal Grandmother    Diabetes Maternal Grandmother    Cancer Paternal Grandmother 70       colon cancer   Diabetes Paternal Grandmother    PE: BP 120/72   Pulse 85   Ht 5' 2 (1.575 m)   Wt 219 lb 6.4 oz (99.5 kg)   SpO2 96%   BMI 40.13 kg/m    Wt Readings from Last 3 Encounters:  10/31/23 219 lb 6.4 oz (99.5 kg)  04/21/23 222 lb 12.8 oz (101.1 kg)  10/12/22 222 lb (100.7 kg)   Constitutional: overweight, in NAD Eyes: EOMI, no exophthalmos ENT: no masses palpated in neck, no cervical lymphadenopathy Cardiovascular: RRR, No MRG Respiratory: CTA B Musculoskeletal: no deformities Skin: o rashes Neurological: no tremor with outstretched hands Diabetic Foot Exam - Simple   Simple Foot Form Diabetic Foot exam was performed with the following findings: Yes 10/31/2023  8:18 AM  Visual Inspection No deformities, no ulcerations, no other skin breakdown bilaterally: Yes Sensation Testing Intact to touch and monofilament testing bilaterally: Yes Pulse Check Posterior Tibialis and Dorsalis pulse intact bilaterally: Yes Comments    ASSESSMENT: 1. DM2, non-insulin-dependent, controlled, with complications - CHF due to dilated cardiomyopathy, on ICD - DR  2. Graves ds - 03/12/2014: Thyroid  uptake and scan: Consistent with Graves' disease: The 24 hr uptake by the thyroid  gland is 65%. Normal 24 hr uptake is 10-30%. On thyroid  imaging, the thyroid  activity appears diffusely increased. No focal hot or cold nodules are identified.  3.  Hyperlipidemia  4. Obesity class III  PLAN:  1. Patient with controlled type 2 diabetes, on SGLT2 inhibitor and GLP-1/GIP receptor agonist, with good control.  At last visit, HbA1c was slightly lower, at 5.8%.  She was not checking blood sugars in the months prior to the appointment, but previously she checked them sporadically and they were at goal.  We continued Farxiga  at that time and increased the Mounjaro  dose.  She was having yeast  infections with Farxiga  and we discussed about using the maintenance dose of terconazole .  I refilled this for her. - She had another HbA1c a week ago and this was stable, only slightly higher, at 5.9%. - At today's visit, sugars remain at goal.  She does mention nausea which could be related to Mounjaro  or to dehydration.  I advised her to increase her fluid intake and let me know if this persist, in which case we need to back off Mounjaro .  I refilled her Farxiga  prescription for now. -I advised her to: Patient Instructions  Please continue: - Farxiga  5 mg before b'fast - Mounjaro  10 mg weekly  Continue terconazole  Cream 0.4%: - insert 1 applicatorful vaginally at bedtime every night for 1 week, then - insert 1/2 applicatorful at bedtime once weekly indefinitely to maintain effect  Please return in 4-6 months with your sugar log.   - advised to check sugars at different times of the day - 1x a day, rotating check times - advised for yearly eye exams >> she is UTD - she recently had an elevated ACR (on her period) - this was communicated to her nephrologist, but  no changes were suggested.  She will see nephrology again towards the end of the year. - return to clinic in 4-6 months  2.  Post ablative hypothyroidism -Patient with history of recurrent Graves' disease, with minimal response to methimazole .  A thyroid  uptake was elevated, at 44.5% in 04/2020.  She had RAI treatment in 05/2020 and developed post ablative hypothyroidism. - latest thyroid  labs reviewed with pt. >> normal 10/25/2023 - she continues on LT4 100 mcg daily - pt feels good on this dose. - we discussed about taking the thyroid  hormone every day, with water, >30 minutes before breakfast, separated by >4 hours from acid reflux medications, calcium , iron, multivitamins. Pt. is taking it correctly.  3.  Hyperlipidemia - Reviewed latest lipid panel from earlier this month: LDL still above our target of less than 55 due to  cardiovascular disease, also, HDL slightly low:  - She is on Lipitor 20 mg daily.  She could not tolerate the 40 mg daily due to joint pains  4.  Obesity class III - Previously on Belviq (but no significant weight loss with this), however, this has been taken off the market - She was doing well on Qsymia  but this was not approved by her insurance - She developed injection site reaction to Bydureon  in the past - She initially lost a significant amount of weight (approximately 18 pounds since starting Ozempic ) but then gained them back - Currently on Mounjaro , weight was stable at last visit.  We increased the dose of Mounjaro  at that time to 10 mg weekly.  At today's visit, she reports nausea and has vomited once.  She is not sure whether this is related to Mounjaro  or not.  We discussed about increasing her fluid intake to make sure the nausea is not related to dehydration, but if the nausea does not improve within a week, to let me know so we can back off the Mounjaro  dose.  Lela Fendt, MD PhD [facility] Endocrinology

## 2023-10-31 NOTE — Patient Instructions (Signed)
 Please continue: - Farxiga  5 mg before b'fast - Mounjaro  10 mg weekly  Continue terconazole  Cream 0.4%: - insert 1 applicatorful vaginally at bedtime every night for 1 week, then - insert 1/2 applicatorful at bedtime once weekly indefinitely to maintain effect  Please return in 4-6 months with your sugar log.

## 2023-11-02 ENCOUNTER — Other Ambulatory Visit (HOSPITAL_COMMUNITY): Payer: Self-pay

## 2023-11-03 ENCOUNTER — Other Ambulatory Visit (HOSPITAL_COMMUNITY): Payer: Self-pay

## 2023-11-06 ENCOUNTER — Other Ambulatory Visit (HOSPITAL_BASED_OUTPATIENT_CLINIC_OR_DEPARTMENT_OTHER): Payer: Self-pay

## 2023-11-06 ENCOUNTER — Other Ambulatory Visit (HOSPITAL_COMMUNITY): Payer: Self-pay

## 2023-11-08 ENCOUNTER — Other Ambulatory Visit: Payer: Self-pay

## 2023-11-08 ENCOUNTER — Other Ambulatory Visit (HOSPITAL_COMMUNITY): Payer: Self-pay

## 2023-12-04 ENCOUNTER — Other Ambulatory Visit (HOSPITAL_COMMUNITY): Payer: Self-pay

## 2024-01-01 ENCOUNTER — Other Ambulatory Visit (HOSPITAL_COMMUNITY): Payer: Self-pay

## 2024-01-02 ENCOUNTER — Other Ambulatory Visit (HOSPITAL_COMMUNITY): Payer: Self-pay

## 2024-01-17 ENCOUNTER — Other Ambulatory Visit (HOSPITAL_COMMUNITY): Payer: Self-pay

## 2024-01-22 ENCOUNTER — Other Ambulatory Visit (HOSPITAL_COMMUNITY): Payer: Self-pay

## 2024-01-22 MED ORDER — IVABRADINE HCL 5 MG PO TABS
5.0000 mg | ORAL_TABLET | Freq: Two times a day (BID) | ORAL | 3 refills | Status: DC
  Filled 2024-01-22: qty 60, 30d supply, fill #0
  Filled 2024-02-13 (×2): qty 60, 30d supply, fill #1
  Filled 2024-03-18: qty 60, 30d supply, fill #2
  Filled 2024-04-12: qty 60, 30d supply, fill #3

## 2024-02-13 ENCOUNTER — Other Ambulatory Visit: Payer: Self-pay

## 2024-02-13 ENCOUNTER — Other Ambulatory Visit (HOSPITAL_COMMUNITY): Payer: Self-pay

## 2024-02-14 ENCOUNTER — Other Ambulatory Visit (HOSPITAL_COMMUNITY): Payer: Self-pay

## 2024-02-16 ENCOUNTER — Other Ambulatory Visit (HOSPITAL_COMMUNITY): Payer: Self-pay

## 2024-03-18 ENCOUNTER — Other Ambulatory Visit (HOSPITAL_COMMUNITY): Payer: Self-pay

## 2024-03-18 ENCOUNTER — Other Ambulatory Visit: Payer: Self-pay

## 2024-03-19 ENCOUNTER — Other Ambulatory Visit (HOSPITAL_COMMUNITY): Payer: Self-pay

## 2024-04-12 ENCOUNTER — Other Ambulatory Visit: Payer: Self-pay | Admitting: Internal Medicine

## 2024-04-12 ENCOUNTER — Other Ambulatory Visit: Payer: Self-pay

## 2024-04-12 ENCOUNTER — Other Ambulatory Visit (HOSPITAL_COMMUNITY): Payer: Self-pay

## 2024-04-12 MED ORDER — MOUNJARO 10 MG/0.5ML ~~LOC~~ SOAJ
10.0000 mg | SUBCUTANEOUS | 1 refills | Status: DC
  Filled 2024-04-12: qty 6, 84d supply, fill #0

## 2024-04-12 MED ORDER — LEVOTHYROXINE SODIUM 100 MCG PO TABS
100.0000 ug | ORAL_TABLET | Freq: Every day | ORAL | 0 refills | Status: AC
  Filled 2024-04-12: qty 30, 30d supply, fill #0

## 2024-04-15 ENCOUNTER — Other Ambulatory Visit (HOSPITAL_COMMUNITY): Payer: Self-pay

## 2024-04-15 ENCOUNTER — Other Ambulatory Visit (HOSPITAL_BASED_OUTPATIENT_CLINIC_OR_DEPARTMENT_OTHER): Payer: Self-pay

## 2024-04-26 ENCOUNTER — Other Ambulatory Visit (HOSPITAL_COMMUNITY): Payer: Self-pay

## 2024-04-26 MED ORDER — NORTRIPTYLINE HCL 10 MG PO CAPS
10.0000 mg | ORAL_CAPSULE | Freq: Every day | ORAL | 2 refills | Status: AC
  Filled 2024-04-26: qty 30, 30d supply, fill #0

## 2024-05-02 NOTE — Progress Notes (Unsigned)
 Patient ID: Ana [name], female   DOB: May 15, 1980, 44 y.o.   MRN: [id number]  HPI: Ana [name] is a 44 y.o.-year-old female, returning for f/u DM2, dx summer 2014, non-insulin-dependent, controlled, without long-term complications and also Graves ds. Last visit 6 months ago.   Interim history: No increased urination, blurry vision.  Before last visit, she had chronic nausea and she vomited 1x. He continues to have occasional yeast infections.  Reviewed HbA1c levels:  Lab Results  Component Value Date   HGBA1C 5.9 (A) 10/12/2022   HGBA1C 5.5 03/23/2022   HGBA1C 6.4 (A) 01/27/2021  12/01/2021: HbA1c 5.6% 08/26/2021: HbA1c 5.9% 10/2012: HbA1c 10.5% Previously 6.5%.   Patient is on a regimen of: - Farxiga  5 mg daily before breakfast-started 03/2022 - Mounjaro  7.5 >> 5 mg weekly (due to availability) >> 7.5 >> 10 mg weekly Stopped metformin  03/2022. Stopped Bydureon  due to reaction to the injection sites. She was previously on Ozempic  before switching to Mounjaro . We had to stop Invokana  100 mg b/c repeated yeast inf. (09/26/2013) She was on JanuMet when she was prediabetic >> tolerated it well.   She has a One Touch Ultra meter.  She is checking sugars 0-1x a day: - am: 91-96 >> 91-100 >> 86-91 >> 91-95 >> 91-109 >> 91-110 - 2h after b'fast: 98-118 >> n/c - lunch:  90-120 >> n/c - 2h after lunch:  <130 >> n/c >> up to 131 >> 141 >> <140 - dinner  90s-130s >> n/c >> 121-130 >>  <124 >> n/c - 2h after dinner: up to 130 >> <130 >> n/c Highest: 130 >> 124 >> n/c >> 131 >> 140  -No CKD - seeing nephrology, last BUN/creatinine:   Lab Results  Component Value Date   BUN 11 03/07/2022   CREATININE 1.05 (H) 03/07/2022   No results found for: MICRALBCREAT On Entresto . She had proteinuria after pregnancy 2010.   -+ HL. Last lipids:   Lab Results  Component Value Date   CHOL 184 03/19/2020   HDL 41.30 03/19/2020   LDLCALC 117 (H) 03/19/2020   LDLDIRECT 97.0  06/05/2018   TRIG 126.0 03/19/2020   CHOLHDL 4 03/19/2020  On pravastatin  20 >> lipitor 20 mg daily.  - Last eye exam 05/16/2023: + DR  - no numbness and tingling in her feet.  Last foot exam 10/31/2023.  In 2023, she was found to have an abnormal EKG (left bundle branch block, left atrial enlargement) during cardiology eval. in preparartion for weight loss surgery. 2D Echo showed a decrease in EF (30%). Heart catheterization did not show CAD.  CT angio did not show a PE but showed left ventricular enlargement.    She started Entresto  and carvedilol  >> changed to metoprolol .  H/o Graves ds.:  Reviewed her treatment history: 02/2014: started MMI 10 mg twice a day MMI tapered down 09/2015: stopped MMI 07/2016: restarted MMI 5 mg daily 06/2017: MMI decrease to 2.5 mg daily  11/2017: MMI stopped again 04/2019: MMI restarted at 5 mg twice a day 06/2019: MMI decrease to 5 mg daily 08/2019: MMI decrease to 2.5 mg daily Thyroid  uptake (04/29/2020): Increased, at 44.5% RAI treatment (06/03/2020) Levothyroxine  started 08/2020.  She is on levothyroxine  100 mcg daily (dose increased 01/2021, decreased 03/2021): - in am - fasting - at least 1h from b'fast - no calcium  - no iron - + multivitamins after lunch >> at night - no PPIs - not on Biotin - on B12 at night>> stopped  Reviewed her  TFTs:    Lab Results  Component Value Date   TSH 2.213 03/07/2022   TSH 1.85 05/27/2021   TSH 13.71 (H) 01/27/2021   TSH 43.95 (H) 11/12/2020   TSH 6.43 (H) 08/18/2020   FREET4 1.00 05/27/2021   FREET4 0.65 01/27/2021   FREET4 0.40 (L) 11/12/2020   FREET4 0.37 (L) 08/18/2020   FREET4 1.42 07/10/2020   Lab Results  Component Value Date   T3FREE 2.5 08/18/2020   T3FREE 4.3 (H) 07/10/2020   T3FREE 3.2 03/19/2020   T3FREE 3.0 09/09/2019   T3FREE 3.0 07/15/2019   T3FREE 6.0 (H) 04/24/2019   T3FREE 3.2 10/26/2018   T3FREE 3.2 06/05/2018   T3FREE 3.5 01/30/2018   T3FREE 4.1 12/11/2017   08/26/2021: TSH 2.79 03/23/2021: TSH 0.057  At last check, her Graves' antibodies were again detectable: Lab Results  Component Value Date   TSI 221 (H) 03/19/2020   TSI <89 01/30/2018   TSI 210 (H) 04/06/2017   TSI 152 (H) 02/11/2016   TSI 188 (H) 12/18/2013   She also has a history of PCOS, HTN, vitamin D  deficiency. On Mirena  IUD.  ROS: + See HPI  I reviewed pt's medications, allergies, PMH, social hx, family hx, and changes were documented in the history of present illness. Otherwise, unchanged from my initial visit note.  Past Medical History:  Diagnosis Date   CHF (congestive heart failure) (HCC) 02/18/2022   DM2 (diabetes mellitus, type 2) (HCC) 01/2013 dx   Hypertension    Other and unspecified hyperlipidemia    PCOS (polycystic ovarian syndrome)    Past Surgical History:  Procedure Laterality Date   CESAREAN SECTION  2010   Social History   Socioeconomic History   Marital status: Married    Spouse name: Not on file   Number of children: Not on file   Years of education: Not on file   Highest education level: Not on file  Occupational History   Not on file  Tobacco Use   Smoking status: Never   Smokeless tobacco: Never  Substance and Sexual Activity   Alcohol use: No   Drug use: No   Sexual activity: Yes    Partners: Male  Other Topics Concern   Not on file  Social History Narrative   Regular exercise: no   Caffeine use: 2 soda's daily   Lives with spouse and son   Works at [employer] as psychologist, occupational   Social Drivers of Health   Tobacco Use: Low Risk (10/31/2023)   Patient History    Smoking Tobacco Use: Never    Smokeless Tobacco Use: Never    Passive Exposure: Not on file  Financial Resource Strain: Low Risk (10/21/2023)   Received from [facility]   Overall Financial Resource Strain (CARDIA)    Difficulty of Paying Living Expenses: Not very hard  Food Insecurity: No Food Insecurity (10/21/2023)   Received from [facility]   Epic    Within the  past 12 months, you worried that your food would run out before you got the money to buy more.: Never true    Within the past 12 months, the food you bought just didn't last and you didn't have money to get more.: Never true  Transportation Needs: No Transportation Needs (10/21/2023)   Received from [facility] - Transportation    Lack of Transportation (Medical): No    Lack of Transportation (Non-Medical): No  Physical Activity: Insufficiently Active (10/21/2023)   Received from [facility]  Exercise Vital Sign    On average, how many days per week do you engage in moderate to strenuous exercise (like a brisk walk)?: 3 days    On average, how many minutes do you engage in exercise at this level?: 10 min  Stress: Stress Concern Present (10/21/2023)   Received from [facility] - Occupational Stress Questionnaire    Feeling of Stress : Very much  Social Connections: Moderately Integrated (10/21/2023)   Received from [facility]   Social Network    How would you rate your social network (family, work, friends)?: Adequate participation with social networks  Intimate Partner Violence: Not At Risk (10/21/2023)   Received from [facility]   HITS    Over the last 12 months how often did your partner physically hurt you?: Never    Over the last 12 months how often did your partner insult you or talk down to you?: Rarely    Over the last 12 months how often did your partner threaten you with physical harm?: Never    Over the last 12 months how often did your partner scream or curse at you?: Rarely  Depression (PHQ2-9): Not on file  Alcohol Screen: Not on file  Housing: Low Risk (10/21/2023)   Received from [facility]    In the last 12 months, was there a time when you were not able to pay the mortgage or rent on time?: No    In the past 12 months, how many times have you moved where you were living?: 0    At any time in the past 12  months, were you homeless or living in a shelter (including now)?: No  Utilities: Not At Risk (10/21/2023)   Received from [facility] Utilities    Threatened with loss of utilities: No  Health Literacy: Not on file   Current Outpatient Medications on File Prior to Visit  Medication Sig Dispense Refill   ASPIRIN LOW DOSE 81 MG tablet Take 81 mg by mouth daily.     atorvastatin  (LIPITOR) 20 MG tablet Take 1 tablet (20 mg total) by mouth daily. 90 tablet 1   carvedilol  (COREG ) 12.5 MG tablet Take 1 tablet (12.5 mg total) by mouth 2 (two) times daily. 180 tablet 3   dapagliflozin  propanediol (FARXIGA ) 5 MG TABS tablet Take 1 tablet (5 mg total) by mouth daily before breakfast. 90 tablet 3   glucose blood test strip Use 2 times day as instructed - One Touch Ultra 100 each 11   ivabradine  (CORLANOR ) 5 MG TABS tablet Take 1 tablet (5 mg total) by mouth 2 (two) times daily. 60 tablet 3   levonorgestrel  (MIRENA ) 20 MCG/24HR IUD 1 Intra Uterine Device (1 each total) by Intrauterine route once. Placed 07/19/13 - gyn 1 each 0   levothyroxine  (SYNTHROID ) 100 MCG tablet Take 1 tablet (100 mcg total) by mouth daily before breakfast. 30 tablet 0   nortriptyline  (PAMELOR ) 10 MG capsule Take 1 capsule (10 mg total) by mouth at bedtime. 30 capsule 2   sacubitril -valsartan  (ENTRESTO ) 24-26 MG Take 1 tablet by mouth 2 (two) times daily. 180 tablet 3   spironolactone  (ALDACTONE ) 25 MG tablet Take 0.5 tablets (12.5 mg total) by mouth every morning. 45 tablet 3   terconazole  (TERAZOL 7 ) 0.4 % vaginal cream Place 1 applicator vaginally at bedtime. 45 g 5   tirzepatide  (MOUNJARO ) 10 MG/0.5ML Pen Inject 10 mg into the  skin once a week. 6 mL 1   No current facility-administered medications on file prior to visit.   Allergies  Allergen Reactions   Invokana  [Canagliflozin ]     Recurrent yeast infections   Family History  Problem Relation Age of Onset   Diabetes Mother        Type II   Thyroid  disease Mother     Hypertension Mother    Hyperlipidemia Mother    Diabetes Father        TYPE II   Hypertension Father    Hyperlipidemia Father    Heart disease Father        CAD with stent   Hyperlipidemia Maternal Grandmother    Hypertension Maternal Grandmother    Obesity Maternal Grandmother    Diabetes Maternal Grandmother    Cancer Paternal Grandmother 17       colon cancer   Diabetes Paternal Grandmother    PE: There were no vitals taken for this visit.   Wt Readings from Last 3 Encounters:  10/31/23 219 lb 6.4 oz (99.5 kg)  04/21/23 222 lb 12.8 oz (101.1 kg)  10/12/22 222 lb (100.7 kg)   Constitutional: overweight, in NAD Eyes: EOMI, no exophthalmos ENT: no masses palpated in neck, no cervical lymphadenopathy Cardiovascular: RRR, No MRG Respiratory: CTA B Musculoskeletal: no deformities Skin: o rashes Neurological: no tremor with outstretched hands  ASSESSMENT: 1. DM2, non-insulin-dependent, controlled, with complications - CHF due to dilated cardiomyopathy, on ICD - DR  2. Graves ds - 03/12/2014: Thyroid  uptake and scan: Consistent with Graves' disease: The 24 hr uptake by the thyroid  gland is 65%. Normal 24 hr uptake is 10-30%. On thyroid  imaging, the thyroid  activity appears diffusely increased. No focal hot or cold nodules are identified.  3.  Hyperlipidemia  4. Obesity class III  PLAN:  1. Patient with type 2 diabetes, on SGLT2 inhibitor and GLP-1/GIP receptor agonist, with good control.  Latest HbA1c was higher, but still at goal, at 5.9%.  Sugars were at goal at last visit.  She did mention nausea but it was unclear whether this was related to Mounjaro  or to dehydration.  I advised her to increase her fluid intake and let me know if the nausea persists, in which case we discussed that we may need to back off Mounjaro .  I did refill her Farxiga  prescription.  She is on terconazole  for recurrent yeast infections. - She had another HbA1c obtained less than a week ago  and this was higher, at 6.3%  -I advised her to: Patient Instructions  Please continue: - Farxiga  5 mg before b'fast - Mounjaro  10 mg weekly  Continue terconazole  Cream 0.4%: - insert 1 applicatorful vaginally at bedtime every night for 1 week, then - insert 1/2 applicatorful at bedtime once weekly indefinitely to maintain effect  Please return in 4-6 months with your sugar log.   - advised to check sugars at different times of the day - 1x a day, rotating check times - advised for yearly eye exams >> she is UTD - return to clinic in 4-6 months  2.  Post ablative hypothyroidism -Patient with history of recurrent Graves' disease, with minimal response to methimazole .  A thyroid  uptake was elevated, at 44.5% in 04/2020.  She had RAI treatment in 05/2020 and developed post ablative hypothyroidism. - latest thyroid  labs reviewed with pt. >> normal in 10/2023, but was still slightly elevated on 04/24/2023, 5.44.  - she continues on LT4 100 mcg daily - pt feels good  on this dose. - we discussed about taking the thyroid  hormone every day, with water, >30 minutes before breakfast, separated by >4 hours from acid reflux medications, calcium , iron, multivitamins. Pt. is taking it correctly.  - At today's visit we will increase her LT4 dose to 112 mcg daily and have her back for labs in 1.5 months  3.  Hyperlipidemia - Latest lipid panel was reviewed from 04/2024: LDL above our target of less than 55 due to history of cardiovascular disease, otherwise fractions at goal - She continues Lipitor 20 mg daily.  She could not tolerate the 40 mg dose.  4.  Obesity class III - Previously on Belviq (but no significant weight loss with this), however, this has been taken off the market - She was doing well on Qsymia  but this was not approved by her insurance - She developed injection site reaction to Bydureon  in the past - She initially lost a significant amount of weight (approximately 18 pounds since  starting Ozempic ) but then gained them back - At last visit she was on Mounjaro  10 mg weekly.  She reported nausea and has vomited once.  She was not sure whether this was related to Mounjaro  or not.  We discussed about increasing her fluid intake to make sure the nausea is not related to dehydration, but if the nausea does not improve within a week, to let me know so we can back off the Mounjaro  dose.  Lela Fendt, MD PhD [facility] Endocrinology

## 2024-05-03 ENCOUNTER — Encounter: Payer: Self-pay | Admitting: Internal Medicine

## 2024-05-03 ENCOUNTER — Ambulatory Visit: Admitting: Internal Medicine

## 2024-05-03 ENCOUNTER — Encounter (HOSPITAL_COMMUNITY): Payer: Self-pay

## 2024-05-03 ENCOUNTER — Other Ambulatory Visit (HOSPITAL_COMMUNITY): Payer: Self-pay

## 2024-05-03 VITALS — BP 120/70 | HR 89 | Ht 62.0 in | Wt 224.6 lb

## 2024-05-03 DIAGNOSIS — Z6841 Body Mass Index (BMI) 40.0 and over, adult: Secondary | ICD-10-CM

## 2024-05-03 DIAGNOSIS — Z7985 Long-term (current) use of injectable non-insulin antidiabetic drugs: Secondary | ICD-10-CM | POA: Diagnosis not present

## 2024-05-03 DIAGNOSIS — E1169 Type 2 diabetes mellitus with other specified complication: Secondary | ICD-10-CM | POA: Diagnosis not present

## 2024-05-03 DIAGNOSIS — E785 Hyperlipidemia, unspecified: Secondary | ICD-10-CM | POA: Diagnosis not present

## 2024-05-03 DIAGNOSIS — Z7984 Long term (current) use of oral hypoglycemic drugs: Secondary | ICD-10-CM

## 2024-05-03 DIAGNOSIS — E119 Type 2 diabetes mellitus without complications: Secondary | ICD-10-CM

## 2024-05-03 DIAGNOSIS — E890 Postprocedural hypothyroidism: Secondary | ICD-10-CM

## 2024-05-03 DIAGNOSIS — E66813 Obesity, class 3: Secondary | ICD-10-CM | POA: Diagnosis not present

## 2024-05-03 MED ORDER — DAPAGLIFLOZIN PROPANEDIOL 5 MG PO TABS
5.0000 mg | ORAL_TABLET | Freq: Every day | ORAL | 3 refills | Status: AC
  Filled 2024-05-03 (×2): qty 90, 90d supply, fill #0

## 2024-05-03 MED ORDER — TERCONAZOLE 0.4 % VA CREA
1.0000 | TOPICAL_CREAM | Freq: Every day | VAGINAL | 5 refills | Status: AC
  Filled 2024-05-03: qty 45, 7d supply, fill #0

## 2024-05-03 MED ORDER — MOUNJARO 10 MG/0.5ML ~~LOC~~ SOAJ
10.0000 mg | SUBCUTANEOUS | 3 refills | Status: AC
  Filled 2024-05-03: qty 6, 84d supply, fill #0

## 2024-05-03 NOTE — Patient Instructions (Addendum)
 Please continue: - Farxiga  5 mg before b'fast - Mounjaro  10 mg weekly  Continue terconazole  Cream 0.4%: - insert 1 applicatorful vaginally at bedtime every night for 1 week, then - insert 1/2 applicatorful at bedtime once weekly indefinitely to maintain effect  STOP SODAS!  Continue Levothyroxine  100 mcg daily.  Take the thyroid  hormone every day, with water, at least 30 minutes before breakfast, separated by at least 4 hours from: - acid reflux medications - calcium  - iron - multivitamins   Come back for labs in 5 weeks.  Please return in 4-6 months with your sugar log.

## 2024-05-13 ENCOUNTER — Other Ambulatory Visit (HOSPITAL_COMMUNITY): Payer: Self-pay

## 2024-05-13 MED ORDER — IVABRADINE HCL 5 MG PO TABS
5.0000 mg | ORAL_TABLET | Freq: Two times a day (BID) | ORAL | 3 refills | Status: AC
  Filled 2024-05-13: qty 60, 30d supply, fill #0

## 2024-06-07 ENCOUNTER — Other Ambulatory Visit

## 2024-11-05 ENCOUNTER — Ambulatory Visit: Admitting: Internal Medicine
